# Patient Record
Sex: Male | Born: 1940 | Race: White | Hispanic: No | Marital: Married | Smoking: Former smoker
Health system: Southern US, Community
[De-identification: ages and names within clinical notes are randomized; demographics above are authoritative.]

## PROBLEM LIST (undated history)

## (undated) DIAGNOSIS — G4733 Obstructive sleep apnea (adult) (pediatric): Secondary | ICD-10-CM

## (undated) DIAGNOSIS — E559 Vitamin D deficiency, unspecified: Secondary | ICD-10-CM

## (undated) DIAGNOSIS — H409 Unspecified glaucoma: Secondary | ICD-10-CM

## (undated) DIAGNOSIS — G2581 Restless legs syndrome: Secondary | ICD-10-CM

## (undated) HISTORY — DX: Unspecified glaucoma: H40.9

## (undated) HISTORY — DX: Vitamin D deficiency, unspecified: E55.9

## (undated) HISTORY — DX: Obstructive sleep apnea (adult) (pediatric): G47.33

## (undated) HISTORY — DX: Restless legs syndrome: G25.81

---
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
## 1984-02-05 HISTORY — PX: OTHER SURGICAL HISTORY: SHX169

## 1997-03-24 ENCOUNTER — Ambulatory Visit (HOSPITAL_COMMUNITY): Admission: RE | Admit: 1997-03-24 | Discharge: 1997-03-24 | Payer: Self-pay | Admitting: Internal Medicine

## 1998-09-19 ENCOUNTER — Ambulatory Visit (HOSPITAL_BASED_OUTPATIENT_CLINIC_OR_DEPARTMENT_OTHER): Admission: RE | Admit: 1998-09-19 | Discharge: 1998-09-19 | Payer: Self-pay | Admitting: Orthopedic Surgery

## 1998-10-17 ENCOUNTER — Ambulatory Visit (HOSPITAL_BASED_OUTPATIENT_CLINIC_OR_DEPARTMENT_OTHER): Admission: RE | Admit: 1998-10-17 | Discharge: 1998-10-17 | Payer: Self-pay | Admitting: Orthopedic Surgery

## 1999-02-12 ENCOUNTER — Ambulatory Visit (HOSPITAL_COMMUNITY): Admission: RE | Admit: 1999-02-12 | Discharge: 1999-02-12 | Payer: Self-pay | Admitting: Internal Medicine

## 1999-02-12 ENCOUNTER — Encounter: Payer: Self-pay | Admitting: Internal Medicine

## 1999-02-15 ENCOUNTER — Encounter: Payer: Self-pay | Admitting: Cardiology

## 1999-02-15 ENCOUNTER — Inpatient Hospital Stay (HOSPITAL_COMMUNITY): Admission: AD | Admit: 1999-02-15 | Discharge: 1999-02-17 | Payer: Self-pay | Admitting: Cardiology

## 1999-02-21 ENCOUNTER — Ambulatory Visit (HOSPITAL_COMMUNITY): Admission: RE | Admit: 1999-02-21 | Discharge: 1999-02-21 | Payer: Self-pay | Admitting: Internal Medicine

## 1999-02-21 ENCOUNTER — Encounter: Payer: Self-pay | Admitting: Internal Medicine

## 1999-06-14 ENCOUNTER — Ambulatory Visit: Admission: RE | Admit: 1999-06-14 | Discharge: 1999-06-14 | Payer: Self-pay | Admitting: Internal Medicine

## 2000-05-26 ENCOUNTER — Encounter: Payer: Self-pay | Admitting: Internal Medicine

## 2000-05-26 ENCOUNTER — Ambulatory Visit (HOSPITAL_COMMUNITY): Admission: RE | Admit: 2000-05-26 | Discharge: 2000-05-26 | Payer: Self-pay | Admitting: Internal Medicine

## 2001-06-18 ENCOUNTER — Encounter: Payer: Self-pay | Admitting: Internal Medicine

## 2001-06-18 ENCOUNTER — Ambulatory Visit (HOSPITAL_COMMUNITY): Admission: RE | Admit: 2001-06-18 | Discharge: 2001-06-18 | Payer: Self-pay | Admitting: Internal Medicine

## 2001-06-24 ENCOUNTER — Encounter: Payer: Self-pay | Admitting: Internal Medicine

## 2001-06-24 ENCOUNTER — Ambulatory Visit (HOSPITAL_COMMUNITY): Admission: RE | Admit: 2001-06-24 | Discharge: 2001-06-24 | Payer: Self-pay | Admitting: Internal Medicine

## 2002-08-16 ENCOUNTER — Ambulatory Visit (HOSPITAL_COMMUNITY): Admission: RE | Admit: 2002-08-16 | Discharge: 2002-08-16 | Payer: Self-pay | Admitting: Internal Medicine

## 2002-08-16 ENCOUNTER — Encounter: Payer: Self-pay | Admitting: Internal Medicine

## 2003-10-04 ENCOUNTER — Ambulatory Visit (HOSPITAL_COMMUNITY): Admission: RE | Admit: 2003-10-04 | Discharge: 2003-10-04 | Payer: Self-pay | Admitting: Internal Medicine

## 2004-04-17 ENCOUNTER — Ambulatory Visit (HOSPITAL_COMMUNITY): Admission: RE | Admit: 2004-04-17 | Discharge: 2004-04-17 | Payer: Self-pay | Admitting: Internal Medicine

## 2005-08-23 ENCOUNTER — Ambulatory Visit (HOSPITAL_COMMUNITY): Admission: RE | Admit: 2005-08-23 | Discharge: 2005-08-23 | Payer: Self-pay | Admitting: Internal Medicine

## 2006-10-06 LAB — HM COLONOSCOPY

## 2006-10-09 ENCOUNTER — Ambulatory Visit: Payer: Self-pay | Admitting: Gastroenterology

## 2006-10-23 ENCOUNTER — Ambulatory Visit: Payer: Self-pay | Admitting: Gastroenterology

## 2006-10-23 ENCOUNTER — Encounter: Payer: Self-pay | Admitting: Gastroenterology

## 2010-06-22 NOTE — Discharge Summary (Signed)
[place]. [facility]  Patient:    Cameron Mcclain                    MRN: [id number] Adm. Date:  [id number] Disc. Date: [id number] Attending:  Loreli Dollar Dictator:   Durwin Glaze, P.A. CC:         Marinus Maw, M.D.             Clinton D. Maple Hudson, M.D.             Thereasa Solo. Little, M.D.                           Discharge Summary  DATE OF BIRTH:  08/04/1940  DISCHARGE DIAGNOSES: 1. Syncope of unclear etiology. 2. Sleep apnea. 3. Hyperlipidemia. 4. Gastroesophageal reflux disease. 5. History of prostatitis.  CONSULTATIONS:  Dr. Fannie Knee, pulmonology, regarding sleep apnea evaluation on February 16, 1999.  PROCEDURES:  None.  COMPLICATIONS:  None.  DISPOSITION:  Home.  DISCHARGE CONDITION:  Much improved.  No syncope, arrhythmias, or orthostatic changes.  DISCHARGE MEDICATIONS: 1. Paxil 10 mg 1 p.o. q.d. 2. Ziac 5 mg 1 p.o. q.d. 3. Zocor 80 mg 1 p.o. q.d. 4. Enteric-coated aspirin 325 mg 1 p.o. q.d. 5. Claritin 10 mg 1 p.o. q.d. 6. Nasacort spray, 2 sprays each naris twice per day x 3 days.  DISCHARGE INSTRUCTIONS: 1. No driving or strenuous activity until seen by Dr. Caprice Kluver. 2. Maintain low-fat, low-concentrated-sweet diet. 3. Follow up with Dr. Caprice Kluver in one to two weeks.  Call office for an    appointment. 4. Follow up with Dr. Maple Hudson regarding sleep apnea.  Call office for    appointment.  HISTORY OF PRESENT ILLNESS:  The patient is a 70 year old male patient with recent URI-type symptoms and had been placed on Zithromax and prednisone.  On Monday, the patient was started on Serevent, Afrin spray, and Sudafed for URI symptoms which were predominantly in head, neck, and chest.  That evening, about 5 hours after using Serevent inhaler, the patient was sitting at the table with his wife drinking wine and had an episode of coughing lasting about a minute followed by a two to three-minute episode of blank  stare on his face with facial flushing and completely unresponsive.   He had no seizure activity or incontinence but was sitting rigid in his chair.  He had no diaphoresis at this time.  His wife shook him, and the patient finally "came to."  The patient was completely alert and oriented and felt fine after that.  He had no recurrent problems for the remainder of that day or the following day, but on Wednesday, the day prior to admission, he went to bed without problems and was able to sleep.  The patients wife noticed abnormal breathing, gasping, sensation for air coming from bedroom and rushed to find her husband lying in bed very rigid, unresponsive, diaphoretic.  The episode lasted about five minutes.  The wife pounded on the patients chest, and the patient finally became completely oriented with no slurred speech or unilateral weakness.  He has had no prior history of seizure activities and is unaware of any arrhythmias.  The patient has had no problems or complaints similar to these prior to this admission.  Of noted, he did use inhaler before he went to bed on Wednesday night.  Over the last several months, the  wife has noted increasing problems with snoring and more episodes of what she describes as apneic episodes.  The patient was never diagnosed with sleep apnea but suspect this may be a problem for him.  The patient denies any chest pain, dyspnea on exertion, change in exercise tolerance, etc.  The patient does smoke and drink alcohol and has noted a slight increase in both of these recently.  The patient was admitted to a telemetry bed for monitoring for arrhythmias. Serevent inhaler was stopped.  A CT scan and EEG have been ordered to rule out seizure disorder.  Will ask Dr. Fannie Knee to evaluate for questionable sleep apnea.  Paxil was also reduced from 20 to 10 mg.  The remainder of medications are the same as outpatient.  ALLERGIES:  ANTIHISTAMINES.  LABORATORY  DATA:  CT of the head with and without contrast on February 15, 1999, negative.  TSH on February 15, 1999, was 0.401.  Hematology on admission showed an elevated white blood count of 17.6, hemoglobin 16.6, hematocrit 47.5, and platelets 277.  Routine chemistry on admission showed sodium 133, potassium 5.7, chloride 93, CO2 32, glucose 125, BUN 15, creatinine 0.8, calcium 9.4, total protein 6.8, albumin 3.7, AST 45, ALT 32, ALP 59, total bilirubin 0.5.  No admission EKG on chart.  Rhythm strips during admission show sinus rhythm/sinus tachycardia with no acute ST changes.  HOSPITAL COURSE:  The patient is a 70 year old male admitted on February 15, 1999, with syncopal episode x 2 of unclear etiology.  The patient was admitted to telemetry bed to rule out arrhythmias with CT of the head and EEG ordered to rule out seizure disorder.  The patients labs were all within normal limits except for elevated potassium and white blood count of unclear etiology.  The patient is on no medications that could be responsible for this except recent URI treated with Z-Pak, last dose February 15, 1999.  The patient has also been on a Serevent inhaler which was stopped on admission.  Initial chest x-ray done on admission showed no significant change since prior chest x-ray performed on March 24, 1997.  The patient had no complaint other than increased tiredness and fatigue over the last several months.  The patient is unaware of any snoring or any arrhythmias.  Pulmonary consult was called in.  They suggested probable obstructed sleep apnea but doubted that this was the direct cause of syncopal episodes as described on admission. Also noted the patient had probable bronchitis and periannular rhinitis.  He has scheduled the patient for sleep study, but it will be awhile secondary to backlog of patients.  Dr. Maple Hudson ordered overnight pulse oximetry and asked nurses to watch for snoring episodes.   Per Dr.  Maple Hudson, the patient was placed on continuous pulse oximetry on February 17, 1999, with patient desaturating on room air to 86 to 89% while asleep.  No acute respiratory distress was noted, and the patient was not snoring at that time.  The patient was then placed on 2 liters via nasal cannula, and O2 saturation were up to 92%.  It was noted that from 2 a.m. to approximately 2:30 a.m., the patient was snoring loudly with 1 to 3-second pauses, diaphoretic, but no acute respiratory distress, and the patient remained asleep.  O2 saturation 92 and 94% on 2 liters via nasal cannula.  At 6:45, the nurse notes state continuous pulse oximetry discontinued.  The patients O2 saturation was 95% on room air, and patient did not voice  any complaints.  The patient had one more episode of snoring with a 1 to 3-second pause, but patient unaware and asleep.  Overall snoring episodes between 4 and 5 a.m.  The patient had no complaints and felt like he slept a little better than the night of admission.  CT of head showed no active disease or processes at this time.  The patient was discharged with orders to follow up with Dr. Clarene Duke in one to two weeks and with Dr. Maple Hudson for sleep apnea workup.  The patient was put on Claritin and nasal steroid for upper respiratory symptoms.  The patients labs were repeated and found to be within normal limits before discharge. DD:  04/20/99 TD:  04/20/99 Job: [id number]

## 2011-06-04 ENCOUNTER — Other Ambulatory Visit: Payer: Self-pay | Admitting: Internal Medicine

## 2011-06-04 DIAGNOSIS — R413 Other amnesia: Secondary | ICD-10-CM

## 2011-06-06 ENCOUNTER — Ambulatory Visit
Admission: RE | Admit: 2011-06-06 | Discharge: 2011-06-06 | Disposition: A | Discharge status: Home / Self Care | Payer: Medicare HMO | Source: Ambulatory Visit | Attending: Internal Medicine | Admitting: Internal Medicine

## 2011-06-06 DIAGNOSIS — R413 Other amnesia: Secondary | ICD-10-CM

## 2011-06-06 MED ORDER — IOHEXOL 300 MG/ML  SOLN
75.0000 mL | Freq: Once | INTRAMUSCULAR | Status: AC | PRN
  Administered 2011-06-06: 75 mL via INTRAVENOUS

## 2011-06-18 ENCOUNTER — Ambulatory Visit (HOSPITAL_COMMUNITY)
Admission: RE | Admit: 2011-06-18 | Discharge: 2011-06-18 | Disposition: A | Discharge status: Home / Self Care | Payer: Medicare HMO | Source: Ambulatory Visit | Attending: Physician Assistant | Admitting: Physician Assistant

## 2011-06-18 ENCOUNTER — Other Ambulatory Visit (HOSPITAL_COMMUNITY): Payer: Self-pay | Admitting: Physician Assistant

## 2011-06-18 DIAGNOSIS — R059 Cough, unspecified: Secondary | ICD-10-CM | POA: Insufficient documentation

## 2011-06-18 DIAGNOSIS — R062 Wheezing: Secondary | ICD-10-CM

## 2011-06-18 DIAGNOSIS — J40 Bronchitis, not specified as acute or chronic: Secondary | ICD-10-CM

## 2011-06-18 DIAGNOSIS — R509 Fever, unspecified: Secondary | ICD-10-CM | POA: Insufficient documentation

## 2011-06-18 DIAGNOSIS — F17200 Nicotine dependence, unspecified, uncomplicated: Secondary | ICD-10-CM

## 2011-06-18 DIAGNOSIS — R05 Cough: Secondary | ICD-10-CM | POA: Insufficient documentation

## 2011-08-09 ENCOUNTER — Other Ambulatory Visit: Payer: Self-pay | Admitting: Diagnostic Neuroimaging

## 2011-08-09 DIAGNOSIS — R413 Other amnesia: Secondary | ICD-10-CM

## 2011-11-04 ENCOUNTER — Encounter: Payer: Self-pay | Admitting: Gastroenterology

## 2011-12-24 ENCOUNTER — Other Ambulatory Visit: Payer: Self-pay | Admitting: Internal Medicine

## 2011-12-24 DIAGNOSIS — R9389 Abnormal findings on diagnostic imaging of other specified body structures: Secondary | ICD-10-CM

## 2011-12-27 ENCOUNTER — Ambulatory Visit
Admission: RE | Admit: 2011-12-27 | Discharge: 2011-12-27 | Disposition: A | Discharge status: Home / Self Care | Payer: Medicare HMO | Source: Ambulatory Visit | Attending: Internal Medicine | Admitting: Internal Medicine

## 2011-12-27 DIAGNOSIS — R9389 Abnormal findings on diagnostic imaging of other specified body structures: Secondary | ICD-10-CM

## 2012-05-07 ENCOUNTER — Other Ambulatory Visit: Payer: Self-pay | Admitting: Diagnostic Neuroimaging

## 2012-07-30 ENCOUNTER — Encounter: Payer: Self-pay | Admitting: Gastroenterology

## 2012-11-08 ENCOUNTER — Other Ambulatory Visit: Payer: Self-pay

## 2012-11-08 MED ORDER — DONEPEZIL HCL 10 MG PO TABS: 10.0000 mg | ORAL_TABLET | Freq: Every day | ORAL | Status: DC

## 2012-12-07 ENCOUNTER — Other Ambulatory Visit: Payer: Self-pay | Admitting: Diagnostic Neuroimaging

## 2012-12-11 NOTE — Telephone Encounter (Signed)
Per phone note of 09.26.2013. Patient also to schedule appt.

## 2012-12-28 ENCOUNTER — Encounter: Payer: Self-pay | Admitting: Internal Medicine

## 2012-12-29 ENCOUNTER — Ambulatory Visit: Payer: Commercial Managed Care - HMO | Admitting: Internal Medicine

## 2012-12-29 ENCOUNTER — Encounter: Payer: Self-pay | Admitting: Internal Medicine

## 2012-12-29 VITALS — BP 100/58 | HR 68 | Temp 98.1°F | Resp 16 | Ht 68.75 in | Wt 161.6 lb

## 2012-12-29 DIAGNOSIS — R1900 Intra-abdominal and pelvic swelling, mass and lump, unspecified site: Secondary | ICD-10-CM

## 2012-12-29 DIAGNOSIS — Z125 Encounter for screening for malignant neoplasm of prostate: Secondary | ICD-10-CM

## 2012-12-29 DIAGNOSIS — I10 Essential (primary) hypertension: Secondary | ICD-10-CM

## 2012-12-29 DIAGNOSIS — E782 Mixed hyperlipidemia: Secondary | ICD-10-CM

## 2012-12-29 DIAGNOSIS — E559 Vitamin D deficiency, unspecified: Secondary | ICD-10-CM

## 2012-12-29 DIAGNOSIS — Z1212 Encounter for screening for malignant neoplasm of rectum: Secondary | ICD-10-CM

## 2012-12-29 DIAGNOSIS — Z0000 Encounter for general adult medical examination without abnormal findings: Secondary | ICD-10-CM

## 2012-12-29 DIAGNOSIS — R7309 Other abnormal glucose: Secondary | ICD-10-CM

## 2012-12-29 DIAGNOSIS — M790 Rheumatism, unspecified: Secondary | ICD-10-CM

## 2012-12-29 DIAGNOSIS — Z79899 Other long term (current) drug therapy: Secondary | ICD-10-CM

## 2012-12-29 DIAGNOSIS — F0280 Dementia in other diseases classified elsewhere without behavioral disturbance: Secondary | ICD-10-CM

## 2012-12-29 LAB — LIPID PANEL
Cholesterol: 230 mg/dL — ABNORMAL HIGH (ref 0–200)
Total CHOL/HDL Ratio: 4.1 Ratio
Triglycerides: 119 mg/dL (ref <150)
VLDL: 24 mg/dL (ref 0–40)

## 2012-12-29 LAB — CBC WITH DIFFERENTIAL/PLATELET
Basophils Absolute: 0.1 10*3/uL (ref 0.0–0.1)
Eosinophils Absolute: 0.2 10*3/uL (ref 0.0–0.7)
HCT: 43.7 % (ref 39.0–52.0)
Lymphocytes Relative: 49 % — ABNORMAL HIGH (ref 12–46)
Lymphs Abs: 4.1 10*3/uL — ABNORMAL HIGH (ref 0.7–4.0)
MCH: 32.9 pg (ref 26.0–34.0)
MCHC: 34.3 g/dL (ref 30.0–36.0)
MCV: 95.8 fL (ref 78.0–100.0)
Neutrophils Relative %: 39 % — ABNORMAL LOW (ref 43–77)
Platelets: 268 10*3/uL (ref 150–400)

## 2012-12-29 LAB — BASIC METABOLIC PANEL WITH GFR
Chloride: 100 mEq/L (ref 96–112)
GFR, Est Non African American: 88 mL/min
Glucose, Bld: 88 mg/dL (ref 70–99)

## 2012-12-29 LAB — HEMOGLOBIN A1C
Hgb A1c MFr Bld: 5.6 % (ref <5.7)
Mean Plasma Glucose: 114 mg/dL (ref <117)

## 2012-12-29 LAB — HEPATIC FUNCTION PANEL
AST: 14 U/L (ref 0–37)
Albumin: 4.5 g/dL (ref 3.5–5.2)
Alkaline Phosphatase: 51 U/L (ref 39–117)
Bilirubin, Direct: 0.1 mg/dL (ref 0.0–0.3)

## 2012-12-29 LAB — MAGNESIUM: Magnesium: 2.1 mg/dL (ref 1.5–2.5)

## 2012-12-29 MED ORDER — COENZYME Q10 30 MG PO CAPS: 30.0000 mg | ORAL_CAPSULE | Freq: Every day | ORAL | Status: DC

## 2012-12-29 MED ORDER — VITAMIN D 50 MCG (2000 UT) PO TABS: 2000.0000 [IU] | ORAL_TABLET | Freq: Every day | ORAL | Status: DC

## 2012-12-29 MED ORDER — CALCIUM CARBONATE-VITAMIN D 500-200 MG-UNIT PO TABS: 1.0000 | ORAL_TABLET | Freq: Every day | ORAL | Status: DC

## 2012-12-29 MED ORDER — FISH OIL 1000 MG PO CAPS: 1000.0000 mg | ORAL_CAPSULE | Freq: Two times a day (BID) | ORAL | Status: DC

## 2012-12-29 NOTE — Patient Instructions (Signed)

## 2012-12-29 NOTE — Progress Notes (Signed)
Patient ID: Cameron Mcclain, male   DOB: [birth year], 72 y.o.   MRN: [id number]  Annual Screening Comprehensive Examination  This very nice 72 yo MWM presents for complete physical.  Patient has been followed for HTN, SDAT, Prediabetes, Hyperlipidemia, and vitamin D Deficiency.   Patient's BP has been controlled at home. He ha lost 13 # over the last year and 20 # over the last 2 years attributed to better eating habits. Patient denies any cardiac symptoms as chest pain, palpitations, shortness of breath, dizziness or ankle swelling.   Patient's hyperlipidemia is controlled with diet and medications. Patient denies myalgias or other medication SE's. Last cholesterol last visit was 180 , triglycerides 164 , HDL 43 and LDL  104 at goal and off Pravastatin which his wife discontinued with his weight loss.     Patient has hx/o prediabetes with A1c 5.8% in November 2011 and last A1c 5.1% in June. Patient denies reactive hypoglycemic symptoms, visual blurring, diabetic polys, or paresthesias.    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 She has stopped his Aricept and Namenda and feels he has improved mentally off of the medication.   Finally, patient has history of Vitamin D Deficiency with last vitamin D 82 in June (was 24 in 2008)     Current Outpatient Prescriptions on File Prior to Visit  Medication Sig Dispense Refill  . B Complex-C (SUPER B COMPLEX PO) Take by mouth.      . benazepril (LOTENSIN) 20 MG tablet Take 20 mg by mouth daily. Take 1&1/2 tablets daily.      . bisoprolol (ZEBETA) 10 MG tablet Take 10 mg by mouth daily. Take 1/2 tablet daily.      Marland Kitchen donepezil (ARICEPT) 10 MG tablet take 1 tablet by mouth once daily  30 tablet  2  . pravastatin (PRAVACHOL) 40 MG tablet Take 40 mg by  mouth daily. Take 1/2 tablet daily.      . sulindac (CLINORIL) 200 MG tablet Take 200 mg by mouth daily.        No Active Allergies  Past Medical History  Diagnosis Date  . Hyperlipidemia   . Hypertension   . Pre-diabetes   . Vitamin D deficiency   . OSA (obstructive sleep apnea)   . RLS (restless legs syndrome)   . Glaucoma     Past Surgical History  Procedure Laterality Date  . Pilonidal cyst / sinus excision  1959  . Axillary Right 1986    biopsy of axillary nodes    Family History  Problem Relation Age of Onset  . Hypertension Mother   . Diabetes Mother   . Hypertension Father   . Cancer Father     History   Social History  . Marital Status: Married    Spouse Name: N/A    Number of Children: N/A  . Years of Education: N/A   Occupational History  . Not on file.   Social History Main Topics  . Smoking status: Former Games developer  . Smokeless tobacco: Not on file  . Alcohol Use: Yes  . Drug Use: No  . Sexual Activity: Not on file   Other Topics Concern  . Not on file   Social History Narrative  . No narrative on file    ROS Constitutional: Denies fever, chills, weight loss/gain, headaches, insomnia,  fatigue, night sweats, and change in appetite. Eyes: Denies redness, blurred vision, diplopia, discharge, itchy, watery eyes.  ENT: Denies discharge, congestion, post nasal drip, epistaxis, sore throat, earache, hearing loss, dental pain, Tinnitus, Vertigo, Sinus pain, snoring.  Cardio: Denies chest pain, palpitations, irregular heartbeat, syncope, dyspnea, diaphoresis, orthopnea, PND, claudication, edema Respiratory: denies cough, dyspnea, DOE, pleurisy, hoarseness, laryngitis, wheezing.  Gastrointestinal: Denies dysphagia, heartburn, reflux, water brash, pain, cramps, nausea, vomiting, bloating, diarrhea, constipation, hematemesis, melena, hematochezia, jaundice, hemorrhoids Genitourinary: Denies dysuria, frequency, urgency, nocturia, hesitancy, discharge,  hematuria, flank pain Musculoskeletal: Denies arthralgia, myalgia, stiffness, Jt. Swelling, pain, limp, and strain/sprain. Skin: Denies puritis, rash, hives, warts, acne, eczema, changing in skin lesion Neuro: Weakness, tremor, incoordination, spasms, paresthesia, pain Psychiatric: Denies confusion, memory loss, sensory loss Endocrine: Denies change in weight, skin, hair change, nocturia, and paresthesia, diabetic polys, visual blurring, hyper /hypo glycemic episodes.  Heme/Lymph: No excessive bleeding, bruising, enlarged lymph nodes.  Filed Vitals:   12/29/12 1425  BP: 100/58  Pulse: 68  Temp: 98.1 F (36.7 C)  Resp: 16    Estimated body mass index is 24.04 kg/(m^2) as calculated from the following:   Height as of this encounter: 5' 8.75" (1.746 m).   Weight as of this encounter: 161 lb 9.6 oz (73.301 kg).  Physical Exam General Appearance: Well nourished, in no apparent distress. Eyes: PERRLA, EOMs, conjunctiva no swelling or erythema, normal fundi and vessels. Sinuses: No frontal/maxillary tenderness ENT/Mouth: EACs patent / TMs  nl. Nares clear without erythema, swelling, mucoid exudates. Oral hygiene is good. No erythema, swelling, or exudate. Tongue normal, non-obstructing. Tonsils not swollen or erythematous. Hearing normal.  Neck: Supple, thyroid normal. No bruits, nodes or JVD. Respiratory: Respiratory effort normal.  BS equal and clear bilateral without rales, rhonci, wheezing or stridor. Cardio: Heart sounds are normal with regular rate and rhythm and no murmurs, rubs or gallops. Peripheral pulses are normal and equal bilaterally without edema. No aortic or femoral bruits. Chest: symmetric with normal excursions and percussion.  Abdomen: Flat, soft, with bowl sounds. Nontender, no guarding, rebound, hernias, masses, or organomegaly.  Lymphatics: Non tender without lymphadenopathy.  Genitourinary: No hernias.Testes nl. DRE - prostate nl for age - smooth & firm w/o  nodules. Musculoskeletal: Full ROM all peripheral extremities, joint stability, 5/5 strength, and normal gait. Skin: Warm and dry without rashes, lesions, cyanosis, clubbing or  ecchymosis.  Neuro: Cranial nerves intact, reflexes equal bilaterally. Normal muscle tone, no cerebellar symptoms. Sensation intact. No focal neuro signs as snout or palmomental response. Mild decrease in ST recall. Pysch: Awake and oriented X 3, normal affect, Insight and Judgment appropriate.   Assessment and Plan  1. Annual Screening Examination 2. Hypertension - agree with wife to stop his benazepril & monitor his  BPs 3. Hyperlipidemia 4. Pre Diabetes 5. Vitamin D Deficiency 6. SDAT  Continue prudent diet as discussed, weight control, regular exercise, and medications. Routine screening labs and tests as requested with regular follow-up as recommended.

## 2012-12-30 LAB — URINALYSIS, MICROSCOPIC ONLY: Squamous Epithelial / LPF: NONE SEEN

## 2012-12-30 LAB — MICROALBUMIN / CREATININE URINE RATIO
Creatinine, Urine: 103.2 mg/dL
Microalb Creat Ratio: 4.8 mg/g (ref 0.0–30.0)

## 2013-01-01 ENCOUNTER — Other Ambulatory Visit: Payer: Self-pay | Admitting: Internal Medicine

## 2013-01-01 DIAGNOSIS — E782 Mixed hyperlipidemia: Secondary | ICD-10-CM

## 2013-01-01 MED ORDER — ATORVASTATIN CALCIUM 80 MG PO TABS: 80.0000 mg | ORAL_TABLET | Freq: Every day | ORAL | Status: DC

## 2013-01-06 ENCOUNTER — Other Ambulatory Visit (INDEPENDENT_AMBULATORY_CARE_PROVIDER_SITE_OTHER): Payer: Medicare HMO | Admitting: Physician Assistant

## 2013-01-06 DIAGNOSIS — Z1212 Encounter for screening for malignant neoplasm of rectum: Secondary | ICD-10-CM

## 2013-01-06 LAB — POC HEMOCCULT BLD/STL (HOME/3-CARD/SCREEN)
Card #2 Fecal Occult Blod, POC: NEGATIVE
Card #3 Fecal Occult Blood, POC: NEGATIVE
Fecal Occult Blood, POC: NEGATIVE

## 2013-01-07 ENCOUNTER — Ambulatory Visit (HOSPITAL_COMMUNITY)
Admission: RE | Admit: 2013-01-07 | Discharge: 2013-01-07 | Disposition: A | Discharge status: Home / Self Care | Payer: Medicare HMO | Source: Ambulatory Visit | Attending: Internal Medicine | Admitting: Internal Medicine

## 2013-01-07 DIAGNOSIS — R634 Abnormal weight loss: Secondary | ICD-10-CM | POA: Insufficient documentation

## 2013-01-07 DIAGNOSIS — I700 Atherosclerosis of aorta: Secondary | ICD-10-CM | POA: Insufficient documentation

## 2013-01-07 DIAGNOSIS — R1900 Intra-abdominal and pelvic swelling, mass and lump, unspecified site: Secondary | ICD-10-CM | POA: Insufficient documentation

## 2013-01-07 DIAGNOSIS — Q619 Cystic kidney disease, unspecified: Secondary | ICD-10-CM | POA: Insufficient documentation

## 2013-05-03 ENCOUNTER — Emergency Department (HOSPITAL_COMMUNITY): Payer: Medicare HMO

## 2013-05-03 ENCOUNTER — Inpatient Hospital Stay (HOSPITAL_COMMUNITY)
Admission: EM | Admit: 2013-05-03 | Discharge: 2013-05-07 | DRG: 193 | Disposition: A | Discharge status: Home / Self Care | Payer: Medicare HMO | Attending: Internal Medicine | Admitting: Internal Medicine

## 2013-05-03 ENCOUNTER — Ambulatory Visit (INDEPENDENT_AMBULATORY_CARE_PROVIDER_SITE_OTHER): Payer: Commercial Managed Care - HMO | Admitting: Physician Assistant

## 2013-05-03 ENCOUNTER — Encounter (HOSPITAL_COMMUNITY): Payer: Self-pay | Admitting: Emergency Medicine

## 2013-05-03 ENCOUNTER — Encounter: Payer: Self-pay | Admitting: Physician Assistant

## 2013-05-03 VITALS — BP 128/78 | HR 100 | Temp 98.6°F | Resp 16 | Wt 159.0 lb

## 2013-05-03 DIAGNOSIS — J441 Chronic obstructive pulmonary disease with (acute) exacerbation: Secondary | ICD-10-CM | POA: Diagnosis present

## 2013-05-03 DIAGNOSIS — Z79899 Other long term (current) drug therapy: Secondary | ICD-10-CM

## 2013-05-03 DIAGNOSIS — E559 Vitamin D deficiency, unspecified: Secondary | ICD-10-CM | POA: Diagnosis present

## 2013-05-03 DIAGNOSIS — Z888 Allergy status to other drugs, medicaments and biological substances status: Secondary | ICD-10-CM

## 2013-05-03 DIAGNOSIS — G4733 Obstructive sleep apnea (adult) (pediatric): Secondary | ICD-10-CM | POA: Diagnosis present

## 2013-05-03 DIAGNOSIS — M790 Rheumatism, unspecified: Secondary | ICD-10-CM

## 2013-05-03 DIAGNOSIS — M797 Fibromyalgia: Secondary | ICD-10-CM

## 2013-05-03 DIAGNOSIS — J189 Pneumonia, unspecified organism: Principal | ICD-10-CM | POA: Diagnosis present

## 2013-05-03 DIAGNOSIS — F0280 Dementia in other diseases classified elsewhere without behavioral disturbance: Secondary | ICD-10-CM | POA: Diagnosis present

## 2013-05-03 DIAGNOSIS — Z833 Family history of diabetes mellitus: Secondary | ICD-10-CM

## 2013-05-03 DIAGNOSIS — Z8249 Family history of ischemic heart disease and other diseases of the circulatory system: Secondary | ICD-10-CM

## 2013-05-03 DIAGNOSIS — E782 Mixed hyperlipidemia: Secondary | ICD-10-CM

## 2013-05-03 DIAGNOSIS — I10 Essential (primary) hypertension: Secondary | ICD-10-CM | POA: Diagnosis present

## 2013-05-03 DIAGNOSIS — G301 Alzheimer's disease with late onset: Secondary | ICD-10-CM

## 2013-05-03 DIAGNOSIS — G2581 Restless legs syndrome: Secondary | ICD-10-CM | POA: Diagnosis present

## 2013-05-03 DIAGNOSIS — J209 Acute bronchitis, unspecified: Secondary | ICD-10-CM

## 2013-05-03 DIAGNOSIS — R7309 Other abnormal glucose: Secondary | ICD-10-CM | POA: Diagnosis present

## 2013-05-03 DIAGNOSIS — J9600 Acute respiratory failure, unspecified whether with hypoxia or hypercapnia: Secondary | ICD-10-CM | POA: Diagnosis present

## 2013-05-03 DIAGNOSIS — H409 Unspecified glaucoma: Secondary | ICD-10-CM | POA: Diagnosis present

## 2013-05-03 DIAGNOSIS — G309 Alzheimer's disease, unspecified: Secondary | ICD-10-CM | POA: Diagnosis present

## 2013-05-03 DIAGNOSIS — R000 Tachycardia, unspecified: Secondary | ICD-10-CM | POA: Diagnosis present

## 2013-05-03 DIAGNOSIS — Z87891 Personal history of nicotine dependence: Secondary | ICD-10-CM

## 2013-05-03 DIAGNOSIS — E785 Hyperlipidemia, unspecified: Secondary | ICD-10-CM | POA: Diagnosis present

## 2013-05-03 LAB — URINALYSIS, ROUTINE W REFLEX MICROSCOPIC
BILIRUBIN URINE: NEGATIVE
GLUCOSE, UA: NEGATIVE mg/dL
HGB URINE DIPSTICK: NEGATIVE
Ketones, ur: 15 mg/dL — AB
Leukocytes, UA: NEGATIVE
Nitrite: NEGATIVE
PROTEIN: NEGATIVE mg/dL
Specific Gravity, Urine: 1.024 (ref 1.005–1.030)
UROBILINOGEN UA: 0.2 mg/dL (ref 0.0–1.0)
pH: 5 (ref 5.0–8.0)

## 2013-05-03 LAB — I-STAT CG4 LACTIC ACID, ED: Lactic Acid, Venous: 2.38 mmol/L — ABNORMAL HIGH (ref 0.5–2.2)

## 2013-05-03 LAB — CBC WITH DIFFERENTIAL/PLATELET
BASOS ABS: 0 10*3/uL (ref 0.0–0.1)
Basophils Relative: 0 % (ref 0–1)
Eosinophils Absolute: 0 10*3/uL (ref 0.0–0.7)
Eosinophils Relative: 0 % (ref 0–5)
HCT: 45.4 % (ref 39.0–52.0)
Hemoglobin: 16.3 g/dL (ref 13.0–17.0)
LYMPHS ABS: 1.1 10*3/uL (ref 0.7–4.0)
LYMPHS PCT: 9 % — AB (ref 12–46)
MCH: 33.4 pg (ref 26.0–34.0)
MCHC: 35.9 g/dL (ref 30.0–36.0)
MCV: 93 fL (ref 78.0–100.0)
Monocytes Absolute: 0.8 10*3/uL (ref 0.1–1.0)
Monocytes Relative: 7 % (ref 3–12)
NEUTROS ABS: 9.4 10*3/uL — AB (ref 1.7–7.7)
NEUTROS PCT: 84 % — AB (ref 43–77)
PLATELETS: 177 10*3/uL (ref 150–400)
RBC: 4.88 MIL/uL (ref 4.22–5.81)
RDW: 12.1 % (ref 11.5–15.5)
WBC: 11.3 10*3/uL — AB (ref 4.0–10.5)

## 2013-05-03 LAB — COMPREHENSIVE METABOLIC PANEL
ALK PHOS: 64 U/L (ref 39–117)
ALT: 19 U/L (ref 0–53)
AST: 29 U/L (ref 0–37)
Albumin: 4.3 g/dL (ref 3.5–5.2)
BILIRUBIN TOTAL: 0.3 mg/dL (ref 0.3–1.2)
BUN: 16 mg/dL (ref 6–23)
CALCIUM: 9 mg/dL (ref 8.4–10.5)
CHLORIDE: 95 meq/L — AB (ref 96–112)
CO2: 24 meq/L (ref 19–32)
Creatinine, Ser: 0.75 mg/dL (ref 0.50–1.35)
GFR, EST NON AFRICAN AMERICAN: 89 mL/min — AB (ref >90)
GLUCOSE: 116 mg/dL — AB (ref 70–99)
POTASSIUM: 3.9 meq/L (ref 3.7–5.3)
Sodium: 137 mEq/L (ref 137–147)
Total Protein: 7.9 g/dL (ref 6.0–8.3)

## 2013-05-03 LAB — TROPONIN I

## 2013-05-03 LAB — PRO B NATRIURETIC PEPTIDE: Pro B Natriuretic peptide (BNP): 44.7 pg/mL (ref 0–125)

## 2013-05-03 MED ORDER — IPRATROPIUM-ALBUTEROL 0.5-2.5 (3) MG/3ML IN SOLN
3.0000 mL | Freq: Once | RESPIRATORY_TRACT | Status: AC
  Administered 2013-05-03: 3 mL via RESPIRATORY_TRACT

## 2013-05-03 MED ORDER — SODIUM CHLORIDE 0.9 % IJ SOLN
3.0000 mL | Freq: Two times a day (BID) | INTRAMUSCULAR | Status: DC
  Administered 2013-05-04 – 2013-05-07 (×6): 3 mL via INTRAVENOUS

## 2013-05-03 MED ORDER — DOCUSATE SODIUM 100 MG PO CAPS
100.0000 mg | ORAL_CAPSULE | Freq: Two times a day (BID) | ORAL | Status: DC
  Administered 2013-05-03 – 2013-05-07 (×8): 100 mg via ORAL
  Filled 2013-05-03 (×8): qty 1

## 2013-05-03 MED ORDER — PROMETHAZINE-DM 6.25-15 MG/5ML PO SYRP: 5.0000 mL | ORAL_SOLUTION | Freq: Four times a day (QID) | ORAL | Status: DC | PRN

## 2013-05-03 MED ORDER — CEFTRIAXONE SODIUM 1 G IJ SOLR
1.0000 g | Freq: Once | INTRAMUSCULAR | Status: AC
  Administered 2013-05-03: 1 g via INTRAMUSCULAR

## 2013-05-03 MED ORDER — SODIUM CHLORIDE 0.9 % IV BOLUS (SEPSIS)
1000.0000 mL | Freq: Once | INTRAVENOUS | Status: AC
  Administered 2013-05-03: 1000 mL via INTRAVENOUS

## 2013-05-03 MED ORDER — ACETAMINOPHEN 650 MG RE SUPP
650.0000 mg | Freq: Once | RECTAL | Status: AC
  Administered 2013-05-03: 650 mg via RECTAL
  Filled 2013-05-03: qty 1

## 2013-05-03 MED ORDER — COENZYME Q10 30 MG PO CAPS: 30.0000 mg | ORAL_CAPSULE | Freq: Every day | ORAL | Status: DC

## 2013-05-03 MED ORDER — ACETAMINOPHEN 325 MG PO TABS: 650.0000 mg | ORAL_TABLET | Freq: Four times a day (QID) | ORAL | Status: DC | PRN

## 2013-05-03 MED ORDER — IPRATROPIUM BROMIDE 0.02 % IN SOLN
0.5000 mg | Freq: Four times a day (QID) | RESPIRATORY_TRACT | Status: DC
  Administered 2013-05-04 (×2): 0.5 mg via RESPIRATORY_TRACT
  Filled 2013-05-03 (×2): qty 2.5

## 2013-05-03 MED ORDER — ALBUTEROL SULFATE (2.5 MG/3ML) 0.083% IN NEBU
2.5000 mg | INHALATION_SOLUTION | RESPIRATORY_TRACT | Status: DC | PRN
  Administered 2013-05-04 – 2013-05-06 (×4): 2.5 mg via RESPIRATORY_TRACT
  Filled 2013-05-03 (×5): qty 3

## 2013-05-03 MED ORDER — METHYLPREDNISOLONE SODIUM SUCC 125 MG IJ SOLR
60.0000 mg | Freq: Four times a day (QID) | INTRAMUSCULAR | Status: DC
  Administered 2013-05-03 – 2013-05-04 (×3): 60 mg via INTRAVENOUS
  Filled 2013-05-03 (×6): qty 0.96

## 2013-05-03 MED ORDER — BISACODYL 10 MG RE SUPP: 10.0000 mg | Freq: Every day | RECTAL | Status: DC | PRN

## 2013-05-03 MED ORDER — PREDNISONE 20 MG PO TABS: ORAL_TABLET | ORAL | Status: DC

## 2013-05-03 MED ORDER — CEFTRIAXONE SODIUM 500 MG IJ SOLR: 500.0000 mg | Freq: Once | INTRAMUSCULAR | Status: DC

## 2013-05-03 MED ORDER — GUAIFENESIN ER 600 MG PO TB12
600.0000 mg | ORAL_TABLET | Freq: Two times a day (BID) | ORAL | Status: DC | PRN
  Administered 2013-05-03: 600 mg via ORAL
  Filled 2013-05-03: qty 1

## 2013-05-03 MED ORDER — OMEGA-3-ACID ETHYL ESTERS 1 G PO CAPS
1.0000 g | ORAL_CAPSULE | Freq: Every day | ORAL | Status: DC
  Administered 2013-05-04 – 2013-05-07 (×4): 1 g via ORAL
  Filled 2013-05-03 (×5): qty 1

## 2013-05-03 MED ORDER — DEXTROSE 5 % IV SOLN
500.0000 mg | Freq: Once | INTRAVENOUS | Status: AC
  Administered 2013-05-03: 500 mg via INTRAVENOUS

## 2013-05-03 MED ORDER — ALUM & MAG HYDROXIDE-SIMETH 200-200-20 MG/5ML PO SUSP: 30.0000 mL | Freq: Four times a day (QID) | ORAL | Status: DC | PRN

## 2013-05-03 MED ORDER — SODIUM CHLORIDE 0.45 % IV SOLN
INTRAVENOUS | Status: DC
  Administered 2013-05-03 – 2013-05-04 (×2): via INTRAVENOUS

## 2013-05-03 MED ORDER — ALBUTEROL SULFATE (2.5 MG/3ML) 0.083% IN NEBU
5.0000 mg | INHALATION_SOLUTION | Freq: Once | RESPIRATORY_TRACT | Status: AC
  Administered 2013-05-03: 5 mg via RESPIRATORY_TRACT
  Filled 2013-05-03: qty 6

## 2013-05-03 MED ORDER — ACETAMINOPHEN 650 MG RE SUPP: 650.0000 mg | Freq: Four times a day (QID) | RECTAL | Status: DC | PRN

## 2013-05-03 MED ORDER — IOHEXOL 350 MG/ML SOLN
100.0000 mL | Freq: Once | INTRAVENOUS | Status: AC | PRN
  Administered 2013-05-03: 100 mL via INTRAVENOUS

## 2013-05-03 MED ORDER — LEVOFLOXACIN 500 MG PO TABS: 500.0000 mg | ORAL_TABLET | Freq: Every day | ORAL | Status: DC

## 2013-05-03 MED ORDER — DEXTROSE 5 % IV SOLN
1.0000 g | INTRAVENOUS | Status: DC
  Administered 2013-05-03 – 2013-05-04 (×2): 1 g via INTRAVENOUS
  Filled 2013-05-03 (×3): qty 10

## 2013-05-03 MED ORDER — ASPIRIN EC 81 MG PO TBEC
81.0000 mg | DELAYED_RELEASE_TABLET | Freq: Every day | ORAL | Status: DC
  Administered 2013-05-03 – 2013-05-07 (×5): 81 mg via ORAL
  Filled 2013-05-03 (×5): qty 1

## 2013-05-03 MED ORDER — DEXTROSE 5 % IV SOLN
500.0000 mg | INTRAVENOUS | Status: DC
  Administered 2013-05-04 – 2013-05-05 (×2): 500 mg via INTRAVENOUS
  Filled 2013-05-03 (×2): qty 500

## 2013-05-03 MED ORDER — VITAMIN D 1000 UNITS PO TABS
2000.0000 [IU] | ORAL_TABLET | Freq: Every day | ORAL | Status: DC
  Administered 2013-05-04 – 2013-05-07 (×4): 2000 [IU] via ORAL
  Filled 2013-05-03 (×4): qty 2

## 2013-05-03 MED ORDER — LATANOPROST 0.005 % OP SOLN
1.0000 [drp] | Freq: Every day | OPHTHALMIC | Status: DC
  Administered 2013-05-03 – 2013-05-06 (×4): 1 [drp] via OPHTHALMIC
  Filled 2013-05-03: qty 2.5

## 2013-05-03 MED ORDER — HEPARIN SODIUM (PORCINE) 5000 UNIT/ML IJ SOLN
5000.0000 [IU] | Freq: Three times a day (TID) | INTRAMUSCULAR | Status: DC
  Administered 2013-05-03 – 2013-05-07 (×10): 5000 [IU] via SUBCUTANEOUS
  Filled 2013-05-03 (×15): qty 1

## 2013-05-03 MED ORDER — MAGNESIUM CITRATE PO SOLN
1.0000 | Freq: Once | ORAL | Status: AC | PRN
  Filled 2013-05-03: qty 296

## 2013-05-03 NOTE — ED Notes (Signed)
Pt placed on NRB 100% related to SPO2 84% on 5L. MD informed.

## 2013-05-03 NOTE — Patient Instructions (Signed)
Please take the prednisone to help decrease inflammation and therefore decrease symptoms. Take it it with food to avoid GI upset. It can cause increased energy but on the other hand it can make it hard to sleep at night so please take it in the morning.  It is not an antibiotic so you can stop it early if you are feeling better.  If you are diabetic it will increase your sugars.   Bronchitis Bronchitis is inflammation of the airways that extend from the windpipe into the lungs (bronchi). The inflammation often causes mucus to develop, which leads to a cough. If the inflammation becomes severe, it may cause shortness of breath. CAUSES  Bronchitis may be caused by:   Viral infections.   Bacteria.   Cigarette smoke.   Allergens, pollutants, and other irritants.  SIGNS AND SYMPTOMS  The most common symptom of bronchitis is a frequent cough that produces mucus. Other symptoms include:  Fever.   Body aches.   Chest congestion.   Chills.   Shortness of breath.   Sore throat.  DIAGNOSIS  Bronchitis is usually diagnosed through a medical history and physical exam. Tests, such as chest X-rays, are sometimes done to rule out other conditions.  TREATMENT  You may need to avoid contact with whatever caused the problem (smoking, for example). Medicines are sometimes needed. These may include:  Antibiotics. These may be prescribed if the condition is caused by bacteria.  Cough suppressants. These may be prescribed for relief of cough symptoms.   Inhaled medicines. These may be prescribed to help open your airways and make it easier for you to breathe.   Steroid medicines. These may be prescribed for those with recurrent (chronic) bronchitis. HOME CARE INSTRUCTIONS  Get plenty of rest.   Drink enough fluids to keep your urine clear or pale yellow (unless you have a medical condition that requires fluid restriction). Increasing fluids may help thin your secretions and will  prevent dehydration.   Only take over-the-counter or prescription medicines as directed by your health care provider.  Only take antibiotics as directed. Make sure you finish them even if you start to feel better.  Avoid secondhand smoke, irritating chemicals, and strong fumes. These will make bronchitis worse. If you are a smoker, quit smoking. Consider using nicotine gum or skin patches to help control withdrawal symptoms. Quitting smoking will help your lungs heal faster.   Put a cool-mist humidifier in your bedroom at night to moisten the air. This may help loosen mucus. Change the water in the humidifier daily. You can also run the hot water in your shower and sit in the bathroom with the door closed for 5 10 minutes.   Follow up with your health care provider as directed.   Wash your hands frequently to avoid catching bronchitis again or spreading an infection to others.  SEEK MEDICAL CARE IF: Your symptoms do not improve after 1 week of treatment.  SEEK IMMEDIATE MEDICAL CARE IF:  Your fever increases.  You have chills.   You have chest pain.   You have worsening shortness of breath.   You have bloody sputum.  You faint.  You have lightheadedness.  You have a severe headache.   You vomit repeatedly. MAKE SURE YOU:   Understand these instructions.  Will watch your condition.  Will get help right away if you are not doing well or get worse. Document Released: 01/21/2005 Document Revised: 11/11/2012 Document Reviewed: 09/15/2012 [employer] Patient Information 2014 [employer].  

## 2013-05-03 NOTE — ED Provider Notes (Signed)
CSN: [id number]     Arrival date & time 05/03/13  1549 History   First MD Initiated Contact with Patient 05/03/13 1558     Chief Complaint  Patient presents with  . Shortness of Breath  . Fever     (Consider location/radiation/quality/duration/timing/severity/associated sxs/prior Treatment) Patient is a 73 y.o. male presenting with cough.  Cough Cough characteristics:  Productive Severity:  Moderate Onset quality:  Gradual Duration:  4 days Timing:  Constant Progression:  Worsening Chronicity:  New Smoker: no   Relieved by:  Nothing Worsened by:  Nothing tried Associated symptoms: chills and shortness of breath   Associated symptoms: no chest pain     Past Medical History  Diagnosis Date  . Hyperlipidemia   . Hypertension   . Pre-diabetes   . Vitamin D deficiency   . OSA (obstructive sleep apnea)   . RLS (restless legs syndrome)   . Glaucoma    Past Surgical History  Procedure Laterality Date  . Pilonidal cyst / sinus excision  1959  . Axillary Right 1986    biopsy of axillary nodes   Family History  Problem Relation Age of Onset  . Hypertension Mother   . Diabetes Mother   . Hypertension Father   . Cancer Father    History  Substance Use Topics  . Smoking status: Former Games developer  . Smokeless tobacco: Not on file  . Alcohol Use: Yes    Review of Systems  Constitutional: Positive for chills.  Respiratory: Positive for cough and shortness of breath.   Cardiovascular: Negative for chest pain.  All other systems reviewed and are negative.      Allergies  Lopid  Home Medications   Current Outpatient Rx  Name  Route  Sig  Dispense  Refill  . atorvastatin (LIPITOR) 80 MG tablet   Oral   Take 1 tablet (80 mg total) by mouth daily.   30 tablet   11   . calcium-vitamin D (OSCAL 500/200 D-3) 500-200 MG-UNIT per tablet   Oral   Take 1 tablet by mouth daily with breakfast.         . Cholecalciferol (VITAMIN D) 2000 UNITS tablet   Oral   Take 1  tablet (2,000 Units total) by mouth daily.         Marland Kitchen co-enzyme Q-10 30 MG capsule   Oral   Take 1 capsule (30 mg total) by mouth daily.         Marland Kitchen donepezil (ARICEPT) 10 MG tablet               . ipratropium (ATROVENT HFA) 17 MCG/ACT inhaler   Inhalation   Inhale 2 puffs into the lungs every 6 (six) hours.         Marland Kitchen latanoprost (XALATAN) 0.005 % ophthalmic solution               . levofloxacin (LEVAQUIN) 500 MG tablet   Oral   Take 1 tablet (500 mg total) by mouth daily.   10 tablet   0   . Omega-3 Fatty Acids (FISH OIL) 1000 MG CAPS   Oral   Take 1 capsule (1,000 mg total) by mouth 2 (two) times daily.      0   . predniSONE (DELTASONE) 20 MG tablet      Take one pill two times daily for 3 days, take one pill daily for 4 days.   10 tablet   0   . promethazine-dextromethorphan (PROMETHAZINE-DM) 6.25-15 MG/5ML syrup  Oral   Take 5 mLs by mouth 4 (four) times daily as needed for cough.   180 mL   0   . sulindac (CLINORIL) 200 MG tablet   Oral   Take 200 mg by mouth daily.          BP 153/85  Temp(Src) 99.4 F (37.4 C) (Oral)  Resp 18  SpO2 86% Physical Exam  Nursing note and vitals reviewed. Constitutional: He is oriented to person, place, and time. He appears well-developed and well-nourished. No distress.  HENT:  Head: Normocephalic and atraumatic.  Mouth/Throat: Oropharynx is clear and moist.  Eyes: Conjunctivae are normal. Pupils are equal, round, and reactive to light. No scleral icterus.  Neck: Neck supple.  Cardiovascular: Normal rate, regular rhythm, normal heart sounds and intact distal pulses.   No murmur heard. Pulmonary/Chest: Effort normal. No stridor. No respiratory distress. He has decreased breath sounds (mild decreased air movement.). He has no wheezes. He has rales (right middle).  Able to speak in full sentences  Abdominal: Soft. He exhibits no distension. There is no tenderness.  Musculoskeletal: Normal range of motion. He  exhibits no edema.  Neurological: He is alert and oriented to person, place, and time.  Skin: Skin is warm and dry. No rash noted. He is not diaphoretic.  Psychiatric: He has a normal mood and affect. His behavior is normal.    ED Course  Procedures (including critical care time) Labs Review Labs Reviewed  CBC WITH DIFFERENTIAL - Abnormal; Notable for the following:    WBC 11.3 (*)    Neutrophils Relative % 84 (*)    Neutro Abs 9.4 (*)    Lymphocytes Relative 9 (*)    All other components within normal limits  COMPREHENSIVE METABOLIC PANEL - Abnormal; Notable for the following:    Chloride 95 (*)    Glucose, Bld 116 (*)    GFR calc non Af Amer 89 (*)    All other components within normal limits  URINALYSIS, ROUTINE W REFLEX MICROSCOPIC - Abnormal; Notable for the following:    Ketones, ur 15 (*)    All other components within normal limits  I-STAT CG4 LACTIC ACID, ED - Abnormal; Notable for the following:    Lactic Acid, Venous 2.38 (*)    All other components within normal limits  CULTURE, BLOOD (ROUTINE X 2)  CULTURE, BLOOD (ROUTINE X 2)  URINE CULTURE  PRO B NATRIURETIC PEPTIDE  TROPONIN I   Imaging Review Dg Chest Port 1 View  05/03/2013   CLINICAL DATA:  Shortness of breath.  EXAM: PORTABLE CHEST - 1 VIEW  COMPARISON:  DG CHEST 2 VIEW dated 06/18/2011  FINDINGS: The lungs are well-expanded. The interstitial markings are mildly increased. Patchy density is present at the right lung base. The cardiac silhouette is normal in size. The pulmonary vascularity is not engorged. The mediastinum is normal in width. There is mild tortuosity of the descending thoracic aorta.  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 Superimposed atelectasis or pneumonia in the right infrahilar region may be present. The costophrenic gutters are excluded. No large pleural effusions are demonstrated. There may be a low-grade pulmonary interstitial edema of cardiac or noncardiac cause. When the  patient can tolerate the procedure, a PA and lateral chest x-ray would be of value.   Electronically Signed   By: David  [place]   On: 05/03/2013 16:49  All radiology studies independently viewed by me.      EKG Interpretation   Date/Time:  Monday May 03 2013 16:01:18 EDT Ventricular Rate:  131 PR Interval:  122 QRS Duration: 73 QT Interval:  411 QTC Calculation: 607 R Axis:   -70 Text Interpretation:  Sinus tachycardia Left anterior fascicular block  Anteroseptal infarct, age indeterminate ST elevation, consider inferior  injury Prolonged QT interval No old tracing to compare Confirmed by  [facility]  MD, TREY ([id number]) on 05/03/2013 5:41:44 PM      MDM   Final diagnoses:  Pneumonia  Other abnormal glucose  Unspecified essential hypertension    73 yo male with malaise, cough, and shortness of breath for 4 days.  Given Rocephin in clinic prior to transfer to ED.  On exam, tachycardic and hypoxic, with tachypnea, but able to speak in full sentences.  Given albuterol nebs without significant improvement.  CTA chest checked due to severe hypoxia and tachycardia in setting of equivocal chest xray.  It did not show PE.  Azithromycin and fluids administered.  Admitted to internal medicine for CAP.   Candyce Churn III, MD 05/04/13 1247

## 2013-05-03 NOTE — ED Notes (Signed)
Pt arrived via EMS from pmd related to sob. Pt given albuterol x1 and rocephin at pmd office. Pt arrives with visible distress.

## 2013-05-03 NOTE — ED Notes (Signed)
Pt and family informed of plan of care. Pt ready for transport to 6n

## 2013-05-03 NOTE — H&P (Signed)
[facility] History and Physical  Cameron DienesRobert V Mcclain QMV:[id number] DOB: Jun 12, 1940 DOA: 05/03/2013  Referring physician: Blake DivineJohn Wofford, Mcclain PCP: Cameron CorwinMCKEOWN,Cameron DAVID, Mcclain   Chief Complaint: Pneumonia  HPI: Cameron Mcclain is a 73 y.o. male with increasing shortness of breath and cough. Patient states he has been ill for at least a month now. He has had cough which is productive no hemoptysis is reported. He has been having some fevers or at least has felt warm. Also the patient states he has had some chills and headaches with generalized aches and pains. Patient was seen in his PCP office and was given some antibiotics. Today he went to the office and was noted to be hypoxic. He was given a shot of rocephin and was sent to the ED. In the ED the patient still had low saturations on 4lpm oxygen he was about 89%. Chest xray was done and this shows presence of infrahilar atelectasis versus pneumonia. A concern was raised about persistant hypoxia and after discussion with the ED a CT scan of the chest was also ordered with no evidenc of pulmonary embolism but there were noted some areas of increased nodularity with posterior lower lobe collapse or consolidation.   Review of Systems:  Constitutional:  ++weight loss, no night sweats, ++Fevers, ++chills, ++fatigue.  HEENT:  No headaches, Sore throat,  Cardio-vascular:  No chest pain, Orthopnea, PND, swelling in lower extremities, anasarca, dizziness, palpitations  GI:  No heartburn, indigestion, abdominal pain, nausea, vomiting, diarrhea, change in bowel habits, loss of appetite  Resp:  ++shortness of breath with exertion. ++ productive cough, No coughing up of blood. ++wheezing.  Skin:  no rash or lesions.  GU:  no dysuria, change in color of urine, no urgency or frequency. No flank pain.  Musculoskeletal:  No joint pain or swelling. No decreased range of motion. No back pain.  Psych:  No change in mood or affect. No depression or anxiety.  ++ memory loss.   Past Medical History  Diagnosis Date  . Hyperlipidemia   . Hypertension   . Pre-diabetes   . Vitamin D deficiency   . OSA (obstructive sleep apnea)   . RLS (restless legs syndrome)   . Glaucoma    Past Surgical History  Procedure Laterality Date  . Pilonidal cyst / sinus excision  1959  . Axillary Right 1986    biopsy of axillary nodes   Social History:  reports that he has quit smoking. He does not have any smokeless tobacco history on file. He reports that he drinks alcohol. He reports that he does not use illicit drugs.  Allergies  Allergen Reactions  . Benadryl [Diphenhydramine Hcl] Other (See Comments)    Hallucinations, itching, shaking    Family History  Problem Relation Age of Onset  . Hypertension Mother   . Diabetes Mother   . Hypertension Father   . Cancer Father      Prior to Admission medications   Medication Sig Start Date End Date Taking? Authorizing Provider  Calcium-Magnesium-Vitamin D (CALCIUM MAGNESIUM PO) Take 1 tablet by mouth daily.   Yes Historical Provider, Mcclain  Cholecalciferol (VITAMIN D) 2000 UNITS tablet Take 1 tablet (2,000 Units total) by mouth daily. 12/29/12  Yes Cameron CowboyWilliam McKeown, Mcclain  co-enzyme Q-10 30 MG capsule Take 1 capsule (30 mg total) by mouth daily. 12/29/12  Yes Cameron CowboyWilliam McKeown, Mcclain  guaiFENesin (MUCINEX) 600 MG 12 hr tablet Take 600 mg by mouth 2 (two) times daily as needed for cough.  Yes Historical Provider, Mcclain  ipratropium (ATROVENT HFA) 17 MCG/ACT inhaler Inhale 2 puffs into the lungs every 6 (six) hours as needed for wheezing.    Yes Historical Provider, Mcclain  latanoprost (XALATAN) 0.005 % ophthalmic solution Place 1 drop into both eyes at bedtime.  12/23/12  Yes Historical Provider, Mcclain  Omega-3 Fatty Acids (FISH OIL) 1000 MG CAPS Take 1 capsule (1,000 mg total) by mouth 2 (two) times daily. 12/29/12  Yes Cameron Mcclain  levofloxacin (LEVAQUIN) 500 MG tablet Take 1 tablet (500 mg total) by mouth daily. 05/03/13    Quentin Mulling, PA-C  predniSONE (DELTASONE) 20 MG tablet Take one pill two times daily for 3 days, take one pill daily for 4 days. 05/03/13   Quentin Mulling, PA-C  promethazine-dextromethorphan (PROMETHAZINE-DM) 6.25-15 MG/5ML syrup Take 5 mLs by mouth 4 (four) times daily as needed for cough. 05/03/13   Quentin Mulling, PA-C   Physical Exam: Filed Vitals:   05/03/13 1930  BP: 129/74  Pulse: 114  Temp:   Resp: 21    BP 129/74  Pulse 114  Temp(Src) 103.1 F (39.5 C) (Rectal)  Resp 21  SpO2 93%  General:  Appears calm and comfortable Eyes: PERRL, normal lids, irises & conjunctiva ENT: grossly normal hearing, lips & tongue Neck: no LAD, masses or thyromegaly Cardiovascular: RRR, no m/r/g. No LE edema. Telemetry: SR, no arrhythmias  Respiratory: Ronchi bilaterally, Normal respiratory effort. Abdomen: soft, ntnd Skin: no rash or induration seen on limited exam Musculoskeletal: grossly normal tone BUE/BLE Psychiatric: grossly normal mood and affect, speech fluent and appropriate Neurologic: grossly non-focal.          Labs on Admission:  Basic Metabolic Panel:  Recent Labs Lab 05/03/13 1613  NA 137  K 3.9  CL 95*  CO2 24  GLUCOSE 116*  BUN 16  CREATININE 0.75  CALCIUM 9.0   Liver Function Tests:  Recent Labs Lab 05/03/13 1613  AST 29  ALT 19  ALKPHOS 64  BILITOT 0.3  PROT 7.9  ALBUMIN 4.3   No results found for this basename: LIPASE, AMYLASE,  in the last 168 hours No results found for this basename: AMMONIA,  in the last 168 hours CBC:  Recent Labs Lab 05/03/13 1613  WBC 11.3*  NEUTROABS 9.4*  HGB 16.3  HCT 45.4  MCV 93.0  PLT 177   Cardiac Enzymes:  Recent Labs Lab 05/03/13 1613  TROPONINI <0.30    BNP (last 3 results)  Recent Labs  05/03/13 1613  PROBNP 44.7   CBG: No results found for this basename: GLUCAP,  in the last 168 hours  Radiological Exams on Admission: Dg Chest Port 1 View  05/03/2013   CLINICAL DATA:  Shortness  of breath.  EXAM: PORTABLE CHEST - 1 VIEW  COMPARISON:  DG CHEST 2 VIEW dated 06/18/2011  FINDINGS: The lungs are well-expanded. The interstitial markings are mildly increased. Patchy density is present at the right lung base. The cardiac silhouette is normal in size. The pulmonary vascularity is not engorged. The mediastinum is normal in width. There is mild tortuosity of the descending thoracic aorta.  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 Superimposed atelectasis or pneumonia in the right infrahilar region may be present. The costophrenic gutters are excluded. No large pleural effusions are demonstrated. There may be a low-grade pulmonary interstitial edema of cardiac or noncardiac cause. When the patient can tolerate the procedure, a PA and lateral chest x-ray would be of value.   Electronically Signed  By: David  [place]   On: 05/03/2013 16:49    EKG: no Acute changes  Assessment/Plan Active Problems:   Unspecified essential hypertension   Other abnormal glucose   Pneumonia   1. Pneumonia -patient will be started on CAP protocol -will check cultures -he also has likely got COPD and will be given nebs for this -will need outpatient follow up for this  2. Hypertension -currently elevated  -he was recently stopped on his home meds -will reassess blood pressures and consider restarting his benzapril  3. Sleep Apnea -currently stable  4. Pre-diabetes -monitor glucoses -will cover if needed  Code Status: Full Code (must indicate code status--if unknown or must be presumed, indicate so) Family Communication: Wife present in Room (indicate person spoken with, if applicable, with phone number if by telephone) Disposition Plan: Home (indicate anticipated LOS)  Time spent:  [facility] Pager [phone]

## 2013-05-03 NOTE — ED Notes (Signed)
Placed on 3l [place] spo2 improved to 90%

## 2013-05-03 NOTE — ED Notes (Signed)
Return from ct.

## 2013-05-03 NOTE — Progress Notes (Signed)
   Subjective:    Patient ID: Cameron Mcclain, male    DOB: 1940-04-06, 73 y.o.   MRN: [id number]  Cough This is a recurrent problem. Episode onset: 2 weeks. The problem has been gradually worsening. The problem occurs constantly. The cough is productive of purulent sputum. Associated symptoms include chills, a fever, headaches, myalgias, nasal congestion, postnasal drip, rhinorrhea, a sore throat, shortness of breath and wheezing. Pertinent negatives include no chest pain, ear congestion, ear pain, heartburn, hemoptysis, rash, sweats or weight loss. The symptoms are aggravated by exercise. He has tried nothing for the symptoms.      Review of Systems  Constitutional: Positive for fever and chills. Negative for weight loss and diaphoresis.  HENT: Positive for congestion, postnasal drip, rhinorrhea, sinus pressure and sore throat. Negative for ear pain, sneezing, trouble swallowing and voice change.   Eyes: Negative.   Respiratory: Positive for cough, shortness of breath and wheezing. Negative for hemoptysis and chest tightness.   Cardiovascular: Negative.  Negative for chest pain.  Gastrointestinal: Negative.  Negative for heartburn.  Genitourinary: Negative.   Musculoskeletal: Positive for myalgias. Negative for neck pain.  Skin: Negative for rash.  Neurological: Positive for headaches.       Objective:   Physical Exam  Constitutional: He is oriented to person, place, and time. He appears well-developed and well-nourished.  HENT:  Head: Normocephalic and atraumatic.  Right Ear: External ear normal.  Left Ear: External ear normal.  Nose: Nose normal.  Mouth/Throat: Oropharynx is clear and moist.  Eyes: Conjunctivae are normal. Pupils are equal, round, and reactive to light.  Neck: Normal range of motion. Neck supple.  Cardiovascular: Normal rate, regular rhythm and normal heart sounds.   No murmur heard. Pulmonary/Chest: Effort normal. No respiratory distress. He has wheezes. He  has no rales. He exhibits no tenderness.  Abdominal: Soft. Bowel sounds are normal.  Lymphadenopathy:    He has no cervical adenopathy.  Neurological: He is alert and oriented to person, place, and time.  Skin: Skin is warm and dry.       Assessment & Plan:  Shortness of breath, fever, chills- O2 at 90 %, desaturated in the office to 85%, 4L O2 at 90-92% Rocephin shot in the office,  911 called to transport to the hospital Denies CP, needs CXR, EKG

## 2013-05-04 DIAGNOSIS — F0280 Dementia in other diseases classified elsewhere without behavioral disturbance: Secondary | ICD-10-CM

## 2013-05-04 DIAGNOSIS — G309 Alzheimer's disease, unspecified: Secondary | ICD-10-CM

## 2013-05-04 DIAGNOSIS — J9600 Acute respiratory failure, unspecified whether with hypoxia or hypercapnia: Secondary | ICD-10-CM

## 2013-05-04 LAB — BLOOD GAS, ARTERIAL
ACID-BASE DEFICIT: 1 mmol/L (ref 0.0–2.0)
Bicarbonate: 23.5 mEq/L (ref 20.0–24.0)
DRAWN BY: 405301
FIO2: 0.36 %
O2 Saturation: 94.8 %
PATIENT TEMPERATURE: 98.6
PCO2 ART: 41.4 mmHg (ref 35.0–45.0)
TCO2: 24.8 mmol/L (ref 0–100)
pH, Arterial: 7.373 (ref 7.350–7.450)
pO2, Arterial: 74.5 mmHg — ABNORMAL LOW (ref 80.0–100.0)

## 2013-05-04 LAB — COMPREHENSIVE METABOLIC PANEL
ALT: 31 U/L (ref 0–53)
AST: 45 U/L — ABNORMAL HIGH (ref 0–37)
Albumin: 3.4 g/dL — ABNORMAL LOW (ref 3.5–5.2)
Alkaline Phosphatase: 58 U/L (ref 39–117)
BUN: 11 mg/dL (ref 6–23)
CALCIUM: 9 mg/dL (ref 8.4–10.5)
CO2: 23 meq/L (ref 19–32)
Chloride: 99 mEq/L (ref 96–112)
Creatinine, Ser: 0.69 mg/dL (ref 0.50–1.35)
GFR calc Af Amer: >90 mL/min (ref >90)
GLUCOSE: 153 mg/dL — AB (ref 70–99)
Potassium: 4 mEq/L (ref 3.7–5.3)
Sodium: 136 mEq/L — ABNORMAL LOW (ref 137–147)
TOTAL PROTEIN: 6.7 g/dL (ref 6.0–8.3)
Total Bilirubin: 0.4 mg/dL (ref 0.3–1.2)

## 2013-05-04 LAB — LEGIONELLA ANTIGEN, URINE: Legionella Antigen, Urine: NEGATIVE

## 2013-05-04 LAB — INFLUENZA PANEL BY PCR (TYPE A & B)
H1N1FLUPCR: NOT DETECTED
INFLBPCR: NEGATIVE
Influenza A By PCR: NEGATIVE

## 2013-05-04 LAB — CBC
HEMATOCRIT: 42 % (ref 39.0–52.0)
HEMOGLOBIN: 14.8 g/dL (ref 13.0–17.0)
MCH: 32.2 pg (ref 26.0–34.0)
MCHC: 35.2 g/dL (ref 30.0–36.0)
MCV: 91.5 fL (ref 78.0–100.0)
Platelets: 165 10*3/uL (ref 150–400)
RBC: 4.59 MIL/uL (ref 4.22–5.81)
RDW: 12.1 % (ref 11.5–15.5)
WBC: 14.8 10*3/uL — AB (ref 4.0–10.5)

## 2013-05-04 LAB — URINE CULTURE

## 2013-05-04 LAB — PROTIME-INR
INR: 1.16 (ref 0.00–1.49)
Prothrombin Time: 14.6 seconds (ref 11.6–15.2)

## 2013-05-04 LAB — APTT: APTT: 37 s (ref 24–37)

## 2013-05-04 LAB — HIV ANTIBODY (ROUTINE TESTING W REFLEX): HIV: NONREACTIVE

## 2013-05-04 LAB — STREP PNEUMONIAE URINARY ANTIGEN: Strep Pneumo Urinary Antigen: NEGATIVE

## 2013-05-04 MED ORDER — PREDNISONE 50 MG PO TABS
60.0000 mg | ORAL_TABLET | Freq: Two times a day (BID) | ORAL | Status: DC
  Administered 2013-05-04 – 2013-05-05 (×2): 60 mg via ORAL
  Filled 2013-05-04 (×4): qty 1

## 2013-05-04 MED ORDER — IPRATROPIUM-ALBUTEROL 0.5-2.5 (3) MG/3ML IN SOLN
3.0000 mL | Freq: Three times a day (TID) | RESPIRATORY_TRACT | Status: DC
  Administered 2013-05-05 – 2013-05-07 (×7): 3 mL via RESPIRATORY_TRACT
  Filled 2013-05-04 (×7): qty 3

## 2013-05-04 MED ORDER — IPRATROPIUM-ALBUTEROL 0.5-2.5 (3) MG/3ML IN SOLN
3.0000 mL | Freq: Four times a day (QID) | RESPIRATORY_TRACT | Status: DC
  Administered 2013-05-04 (×2): 3 mL via RESPIRATORY_TRACT
  Filled 2013-05-04 (×2): qty 3

## 2013-05-04 MED ORDER — PREDNISONE 50 MG PO TABS
50.0000 mg | ORAL_TABLET | Freq: Two times a day (BID) | ORAL | Status: DC
  Filled 2013-05-04 (×2): qty 1

## 2013-05-04 MED ORDER — DM-GUAIFENESIN ER 30-600 MG PO TB12
1.0000 | ORAL_TABLET | Freq: Two times a day (BID) | ORAL | Status: DC
  Administered 2013-05-04 – 2013-05-07 (×6): 1 via ORAL
  Filled 2013-05-04 (×8): qty 1

## 2013-05-04 NOTE — Progress Notes (Signed)
Utilization Review Completed.Gordana Kewley T3/31/2015  

## 2013-05-04 NOTE — Progress Notes (Signed)
Chart reviewed.   [facility] PROGRESS NOTE  Barry DienesRobert V Basquez ZOX:[id number] DOB: 1940/06/05 DOA: 05/03/2013 PCP: Nadean CorwinMCKEOWN,[name] DAVID, MD  Assessment/Plan:   Community acquiredPneumonia Active Problems:   Unspecified essential hypertension   Other abnormal glucose   Alzheimer's disease Copd exacerbation   Acute respiratory failure  continue rocephin, azithro. Change steroids to po and schedule duonebs  Code Status:  full Family Communication:  wife Disposition Plan:  home  HPI/Subjective: Became extremely SOB earlier today  Objective: Filed Vitals:   05/04/13 1432  BP: 121/80  Pulse: 88  Temp: 98.3 F (36.8 C)  Resp: 18    Intake/Output Summary (Last 24 hours) at 05/04/13 1454 Last data filed at 05/04/13 1433  Gross per 24 hour  Intake 850.83 ml  Output   2025 ml  Net -1174.17 ml   Filed Weights   05/03/13 2055  Weight: 71.215 kg (157 lb)    Exam:   General:  Talkative. comfortable  Cardiovascular: RRR  Respiratory: bilateral mild wheeze. Breathing nonlabored.  Abdomen: S, NT, ND  Ext: no CCE  Basic Metabolic Panel:  Recent Labs Lab 05/03/13 1613 05/04/13 0454  NA 137 136*  K 3.9 4.0  CL 95* 99  CO2 24 23  GLUCOSE 116* 153*  BUN 16 11  CREATININE 0.75 0.69  CALCIUM 9.0 9.0   Liver Function Tests:  Recent Labs Lab 05/03/13 1613 05/04/13 0454  AST 29 45*  ALT 19 31  ALKPHOS 64 58  BILITOT 0.3 0.4  PROT 7.9 6.7  ALBUMIN 4.3 3.4*   No results found for this basename: LIPASE, AMYLASE,  in the last 168 hours No results found for this basename: AMMONIA,  in the last 168 hours CBC:  Recent Labs Lab 05/03/13 1613 05/04/13 0454  WBC 11.3* 14.8*  NEUTROABS 9.4*  --   HGB 16.3 14.8  HCT 45.4 42.0  MCV 93.0 91.5  PLT 177 165   Cardiac Enzymes:  Recent Labs Lab 05/03/13 1613  TROPONINI <0.30   BNP (last 3 results)  Recent Labs  05/03/13 1613  PROBNP 44.7   CBG: No results found for this basename: GLUCAP,  in  the last 168 hours  Recent Results (from the past 240 hour(s))  CULTURE, BLOOD (ROUTINE X 2)     Status: None   Collection Time    05/03/13  4:15 PM      Result Value Ref Range Status   Specimen Description BLOOD HAND RIGHT   Final   Special Requests BOTTLES DRAWN AEROBIC AND ANAEROBIC 3CC   Final   Culture  Setup Time     Final   Value: 05/03/2013 20:36     [facility]   Culture     Final   Value:        BLOOD CULTURE RECEIVED NO GROWTH TO DATE CULTURE WILL BE HELD FOR 5 DAYS BEFORE ISSUING A FINAL NEGATIVE REPORT     Performed at [facility]   Report Status PENDING   Incomplete  CULTURE, BLOOD (ROUTINE X 2)     Status: None   Collection Time    05/03/13  4:15 PM      Result Value Ref Range Status   Specimen Description BLOOD RIGHT FOREARM   Final   Special Requests BOTTLES DRAWN AEROBIC AND ANAEROBIC 5CC   Final   Culture  Setup Time     Final   Value: 05/03/2013 20:36     [facility]formed at [facility]  Culture     Final   Value:        BLOOD CULTURE RECEIVED NO GROWTH TO DATE CULTURE WILL BE HELD FOR 5 DAYS BEFORE ISSUING A FINAL NEGATIVE REPORT     Performed at [facility]   Report Status PENDING   Incomplete     Studies: Ct Angio Chest Pe W/cm &/or Wo Cm  05/03/2013   CLINICAL DATA:  Shortness of breath and hypoxia.  EXAM: CT ANGIOGRAPHY CHEST WITH CONTRAST  TECHNIQUE: Multidetector CT imaging of the chest was performed using the standard protocol during bolus administration of intravenous contrast. Multiplanar CT image reconstructions and MIPs were obtained to evaluate the vascular anatomy.  CONTRAST:  OMNIPAQUE IOHEXOL 350 MG/ML SOLN  COMPARISON:  None.  FINDINGS: There is some motion artifact in the lower lobes, but no filling defect is identified within the pulmonary arteries to suggest the presence of an acute pulmonary embolus. No thoracic aortic aneurysm.  There is no axillary lymphadenopathy. No mediastinal  lymphadenopathy small mediastinal lymph nodes are. This may be a post infectious or inflammatory alveolitis evident the do not meet CT criteria for pathologic enlargement. 10 mm short axis right hilar lymph node is identified. There is a 9 mm short axis lymph node in the left hilum.  No pericardial or pleural effusion. Coronary artery calcification is noted.  Lung windows demonstrate emphysema. There is bibasilar dependent collapse/ consolidation in the lower lobes bilaterally, right slightly more than left. 16 mm ill-defined nodular area of opacity is identified in the left lower lobe on image 66.  Images which include the upper abdomen reveal a 2.2 cm nodule in the right adrenal gland which averages -7 Hounsfield units, compatible with adrenal adenoma. 7 mm water density lesion in the interpolar right kidney cannot be definitively characterize but is probably a cyst. 16 mm low-density lesion in the left kidney is also likely a cyst  Bone windows reveal no worrisome lytic or sclerotic osseous lesions.  Review of the MIP images confirms the above findings.  IMPRESSION: No CT evidence for acute pulmonary embolus. No thoracic aortic aneurysm.  There is posterior lower lobe collapse/consolidation, right slightly more than left. This may be atelectatic, but superimposed pneumonia is a possibility.  16 mm area ill-defined nodular opacity is identified in the left lower lobe with adjacent ill-defined satellite nodules. This may be a focus of infectious or inflammatory alveolitis. Followup CT chest without contrast in 3 months is recommended to assess for resolution.  Borderline mediastinal and hilar lymphadenopathy.   Electronically Signed   By: Kennith Center M.D.   On: 05/03/2013 19:41   Dg Chest Port 1 View  05/03/2013   CLINICAL DATA:  Shortness of breath.  EXAM: PORTABLE CHEST - 1 VIEW  COMPARISON:  DG CHEST 2 VIEW dated 06/18/2011  FINDINGS: The lungs are well-expanded. The interstitial markings are mildly  increased. Patchy density is present at the right lung base. The cardiac silhouette is normal in size. The pulmonary vascularity is not engorged. The mediastinum is normal in width. There is mild tortuosity of the descending thoracic aorta.  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 Superimposed atelectasis or pneumonia in the right infrahilar region may be present. The costophrenic gutters are excluded. No large pleural effusions are demonstrated. There may be a low-grade pulmonary interstitial edema of cardiac or noncardiac cause. When the patient can tolerate the procedure, a PA and lateral chest x-ray would be of value.   Electronically Signed  By: David  [place]   On: 05/03/2013 16:49    Scheduled Meds: . aspirin EC  81 mg Oral Daily  . azithromycin  500 mg Intravenous Q24H  . cefTRIAXone (ROCEPHIN)  IV  1 g Intravenous Q24H  . cholecalciferol  2,000 Units Oral Daily  . docusate sodium  100 mg Oral BID  . heparin  5,000 Units Subcutaneous 3 times per day  . latanoprost  1 drop Both Eyes QHS  . omega-3 acid ethyl esters  1 g Oral Daily  . predniSONE  50 mg Oral BID WC  . sodium chloride  3 mL Intravenous Q12H   Continuous Infusions: . sodium chloride 50 mL/hr at 05/03/13 2155    Time spent: 35 minutes  Christmas Faraci L  [facility] Pager [phone]. If 7PM-7AM, please contact night-coverage at [email or web address], password [facility] 05/04/2013, 2:54 PM  LOS: 1 day

## 2013-05-05 MED ORDER — PREDNISONE 20 MG PO TABS
40.0000 mg | ORAL_TABLET | Freq: Every day | ORAL | Status: DC
  Administered 2013-05-06 – 2013-05-07 (×2): 40 mg via ORAL
  Filled 2013-05-05 (×3): qty 2

## 2013-05-05 MED ORDER — BENZONATATE 100 MG PO CAPS
100.0000 mg | ORAL_CAPSULE | Freq: Three times a day (TID) | ORAL | Status: DC
  Administered 2013-05-05 – 2013-05-07 (×6): 100 mg via ORAL
  Filled 2013-05-05 (×8): qty 1

## 2013-05-05 MED ORDER — CEFUROXIME AXETIL 500 MG PO TABS
500.0000 mg | ORAL_TABLET | Freq: Two times a day (BID) | ORAL | Status: DC
  Administered 2013-05-05 – 2013-05-07 (×4): 500 mg via ORAL
  Filled 2013-05-05 (×6): qty 1

## 2013-05-05 MED ORDER — AZITHROMYCIN 500 MG PO TABS
500.0000 mg | ORAL_TABLET | Freq: Every day | ORAL | Status: DC
  Administered 2013-05-06 – 2013-05-07 (×2): 500 mg via ORAL
  Filled 2013-05-05 (×2): qty 1

## 2013-05-05 NOTE — Progress Notes (Signed)
[facility] PROGRESS NOTE  Cameron Mcclain ZOX:[id number] DOB: 1940-12-10 DOA: 05/03/2013 PCP: Nadean Corwin, MD  Assessment/Plan:   Community acquiredPneumonia Active Problems:   Unspecified essential hypertension   Other abnormal glucose   Alzheimer's disease Copd exacerbation: wean steroids   Acute respiratory failure. Wean oxygen as able  Monitor another 24-48 hours  Code Status:  full Family Communication:  wife Disposition Plan:  home  HPI/Subjective: Became extremely SOB again this am  Objective: Filed Vitals:   05/05/13 0547  BP: 114/81  Pulse: 97  Temp: 98.6 F (37 C)  Resp: 17    Intake/Output Summary (Last 24 hours) at 05/05/13 1336 Last data filed at 05/05/13 1252  Gross per 24 hour  Intake   2095 ml  Output   1625 ml  Net    470 ml   Filed Weights   05/03/13 2055  Weight: 71.215 kg (157 lb)    Exam:   General:  Talkative. comfortable  Cardiovascular: RRR  Respiratory: CTA without WRR  Abdomen: S, NT, ND  Ext: no CCE  Basic Metabolic Panel:  Recent Labs Lab 05/03/13 1613 05/04/13 0454  NA 137 136*  K 3.9 4.0  CL 95* 99  CO2 24 23  GLUCOSE 116* 153*  BUN 16 11  CREATININE 0.75 0.69  CALCIUM 9.0 9.0   Liver Function Tests:  Recent Labs Lab 05/03/13 1613 05/04/13 0454  AST 29 45*  ALT 19 31  ALKPHOS 64 58  BILITOT 0.3 0.4  PROT 7.9 6.7  ALBUMIN 4.3 3.4*   No results found for this basename: LIPASE, AMYLASE,  in the last 168 hours No results found for this basename: AMMONIA,  in the last 168 hours CBC:  Recent Labs Lab 05/03/13 1613 05/04/13 0454  WBC 11.3* 14.8*  NEUTROABS 9.4*  --   HGB 16.3 14.8  HCT 45.4 42.0  MCV 93.0 91.5  PLT 177 165   Cardiac Enzymes:  Recent Labs Lab 05/03/13 1613  TROPONINI <0.30   BNP (last 3 results)  Recent Labs  05/03/13 1613  PROBNP 44.7   CBG: No results found for this basename: GLUCAP,  in the last 168 hours  Recent Results (from the past 240  hour(s))  CULTURE, BLOOD (ROUTINE X 2)     Status: None   Collection Time    05/03/13  4:15 PM      Result Value Ref Range Status   Specimen Description BLOOD HAND RIGHT   Final   Special Requests BOTTLES DRAWN AEROBIC AND ANAEROBIC 3CC   Final   Culture  Setup Time     Final   Value: 05/03/2013 20:36     Performed at [facility]   Culture     Final   Value:        BLOOD CULTURE RECEIVED NO GROWTH TO DATE CULTURE WILL BE HELD FOR 5 DAYS BEFORE ISSUING A FINAL NEGATIVE REPORT     Performed at [facility]   Report Status PENDING   Incomplete  CULTURE, BLOOD (ROUTINE X 2)     Status: None   Collection Time    05/03/13  4:15 PM      Result Value Ref Range Status   Specimen Description BLOOD RIGHT FOREARM   Final   Special Requests BOTTLES DRAWN AEROBIC AND ANAEROBIC 5CC   Final   Culture  Setup Time     Final   Value: 05/03/2013 20:36     Performed at [facility]  Culture     Final   Value:        BLOOD CULTURE RECEIVED NO GROWTH TO DATE CULTURE WILL BE HELD FOR 5 DAYS BEFORE ISSUING A FINAL NEGATIVE REPORT     Performed at [facility]   Report Status PENDING   Incomplete  URINE CULTURE     Status: None   Collection Time    05/03/13  4:44 PM      Result Value Ref Range Status   Specimen Description URINE, RANDOM   Final   Special Requests NONE   Final   Culture  Setup Time     Final   Value: 05/03/2013 23:58     Performed at [facility] Count     Final   Value: 8,000 COLONIES/ML     Performed at [facility]   Culture     Final   Value: INSIGNIFICANT GROWTH     Performed at [facility]   Report Status 05/04/2013 FINAL   Final     Studies: Ct Angio Chest Pe W/cm &/or Wo Cm  05/03/2013   CLINICAL DATA:  Shortness of breath and hypoxia.  EXAM: CT ANGIOGRAPHY CHEST WITH CONTRAST  TECHNIQUE: Multidetector CT imaging of the chest was performed using the standard protocol during bolus administration of  intravenous contrast. Multiplanar CT image reconstructions and MIPs were obtained to evaluate the vascular anatomy.  CONTRAST:  OMNIPAQUE IOHEXOL 350 MG/ML SOLN  COMPARISON:  None.  FINDINGS: There is some motion artifact in the lower lobes, but no filling defect is identified within the pulmonary arteries to suggest the presence of an acute pulmonary embolus. No thoracic aortic aneurysm.  There is no axillary lymphadenopathy. No mediastinal lymphadenopathy small mediastinal lymph nodes are. This may be a post infectious or inflammatory alveolitis evident the do not meet CT criteria for pathologic enlargement. 10 mm short axis right hilar lymph node is identified. There is a 9 mm short axis lymph node in the left hilum.  No pericardial or pleural effusion. Coronary artery calcification is noted.  Lung windows demonstrate emphysema. There is bibasilar dependent collapse/ consolidation in the lower lobes bilaterally, right slightly more than left. 16 mm ill-defined nodular area of opacity is identified in the left lower lobe on image 66.  Images which include the upper abdomen reveal a 2.2 cm nodule in the right adrenal gland which averages -7 Hounsfield units, compatible with adrenal adenoma. 7 mm water density lesion in the interpolar right kidney cannot be definitively characterize but is probably a cyst. 16 mm low-density lesion in the left kidney is also likely a cyst  Bone windows reveal no worrisome lytic or sclerotic osseous lesions.  Review of the MIP images confirms the above findings.  IMPRESSION: No CT evidence for acute pulmonary embolus. No thoracic aortic aneurysm.  There is posterior lower lobe collapse/consolidation, right slightly more than left. This may be atelectatic, but superimposed pneumonia is a possibility.  16 mm area ill-defined nodular opacity is identified in the left lower lobe with adjacent ill-defined satellite nodules. This may be a focus of infectious or inflammatory  alveolitis. Followup CT chest without contrast in 3 months is recommended to assess for resolution.  Borderline mediastinal and hilar lymphadenopathy.   Electronically Signed   By: Kennith Center M.D.   On: 05/03/2013 19:41   Dg Chest Port 1 View  05/03/2013   CLINICAL DATA:  Shortness of breath.  EXAM: PORTABLE CHEST - 1  VIEW  COMPARISON:  DG CHEST 2 VIEW dated 06/18/2011  FINDINGS: The lungs are well-expanded. The interstitial markings are mildly increased. Patchy density is present at the right lung base. The cardiac silhouette is normal in size. The pulmonary vascularity is not engorged. The mediastinum is normal in width. There is mild tortuosity of the descending thoracic aorta.  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 Superimposed atelectasis or pneumonia in the right infrahilar region may be present. The costophrenic gutters are excluded. No large pleural effusions are demonstrated. There may be a low-grade pulmonary interstitial edema of cardiac or noncardiac cause. When the patient can tolerate the procedure, a PA and lateral chest x-ray would be of value.   Electronically Signed   By: David  [place]   On: 05/03/2013 16:49    Scheduled Meds: . aspirin EC  81 mg Oral Daily  . azithromycin  500 mg Intravenous Q24H  . cefTRIAXone (ROCEPHIN)  IV  1 g Intravenous Q24H  . cholecalciferol  2,000 Units Oral Daily  . dextromethorphan-guaiFENesin  1 tablet Oral BID  . docusate sodium  100 mg Oral BID  . heparin  5,000 Units Subcutaneous 3 times per day  . ipratropium-albuterol  3 mL Nebulization TID  . latanoprost  1 drop Both Eyes QHS  . omega-3 acid ethyl esters  1 g Oral Daily  . predniSONE  60 mg Oral BID WC  . sodium chloride  3 mL Intravenous Q12H   Continuous Infusions: . sodium chloride 50 mL/hr at 05/04/13 1836    Time spent: 25 minutes  Cameron Mcclain  [facility] Pager [phone]. If 7PM-7AM, please contact night-coverage at [email or web address], password  [email or web address] 05/05/2013, 1:36 PM  LOS: 2 days

## 2013-05-06 ENCOUNTER — Inpatient Hospital Stay (HOSPITAL_COMMUNITY): Payer: Medicare HMO

## 2013-05-06 MED ORDER — LORAZEPAM 2 MG/ML IJ SOLN: 1.0000 mg | Freq: Four times a day (QID) | INTRAMUSCULAR | Status: DC | PRN

## 2013-05-06 MED ORDER — METHYLPREDNISOLONE SODIUM SUCC 40 MG IJ SOLR
40.0000 mg | Freq: Once | INTRAMUSCULAR | Status: AC
  Administered 2013-05-06: 40 mg via INTRAVENOUS

## 2013-05-06 MED ORDER — FUROSEMIDE 40 MG PO TABS
40.0000 mg | ORAL_TABLET | Freq: Once | ORAL | Status: AC
  Administered 2013-05-06: 40 mg via ORAL
  Filled 2013-05-06: qty 1

## 2013-05-06 MED ORDER — METHYLPREDNISOLONE SODIUM SUCC 40 MG IJ SOLR
40.0000 mg | Freq: Once | INTRAMUSCULAR | Status: DC
  Filled 2013-05-06: qty 1

## 2013-05-06 NOTE — Progress Notes (Signed)
Patient evaluated for community based chronic disease management services with [facility] as a benefit of patient's [employer]. Spoke with patient at bedside to explain [facility].  Patient has short term memory issues and would like his wife to review the consent before accepting services.  Left contact information and [facility] literature at bedside for his spouse to review.  Made Izell CarolinaHeather Wiles Inpatient Case Manager aware that [facility] Care Management has consulted and the status of the referral. Will await contact from wife to engage.  Of note, [facility] services does not replace or interfere with any services that are arranged by inpatient case management or social work.  For additional questions or referrals please contact Anibal Hendersonim Henderson BSN RN [facility] Hospital Liaison at [phone].

## 2013-05-07 DIAGNOSIS — J441 Chronic obstructive pulmonary disease with (acute) exacerbation: Secondary | ICD-10-CM

## 2013-05-07 MED ORDER — BENZONATATE 100 MG PO CAPS: 100.0000 mg | ORAL_CAPSULE | Freq: Three times a day (TID) | ORAL | Status: DC | PRN

## 2013-05-07 MED ORDER — CEFUROXIME AXETIL 500 MG PO TABS: 500.0000 mg | ORAL_TABLET | Freq: Two times a day (BID) | ORAL | Status: DC

## 2013-05-07 MED ORDER — FLUTICASONE-SALMETEROL 115-21 MCG/ACT IN AERO: 2.0000 | INHALATION_SPRAY | Freq: Two times a day (BID) | RESPIRATORY_TRACT | Status: DC

## 2013-05-07 MED ORDER — PREDNISONE 10 MG PO TABS: ORAL_TABLET | ORAL | Status: DC

## 2013-05-07 NOTE — Progress Notes (Signed)
Patient oxygen sat dropped to 86% on room air at rest, O2 started at 2LPM and O2 sat up to 92%.

## 2013-05-07 NOTE — Discharge Summary (Signed)
Physician Discharge Summary  Barry DienesRobert V Malizia JYN:[id number]:[id number] DOB: 1940-06-23 DOA: 05/03/2013  PCP: Nadean CorwinMCKEOWN,[name] DAVID, MD  Admit date: 05/03/2013 Discharge date: 05/07/2013  Time spent: greater than 30 min  Recommendations for Outpatient Follow-up:   Discharge Diagnoses:    CAP (community acquired pneumonia)   Acute respiratory failure   COPD exacerbation   Unspecified essential hypertension   Alzheimer's disease  Discharge Condition: stable  Filed Weights   05/03/13 2055  Weight: 71.215 kg (157 lb)    History of present illness:  73 y.o. male with increasing shortness of breath and cough. Patient states he has been ill for at least a month now. He has had cough which is productive no hemoptysis is reported. He has been having some fevers or at least has felt warm. Also the patient states he has had some chills and headaches with generalized aches and pains. Patient was seen in his PCP office and was given some antibiotics. Today he went to the office and was noted to be hypoxic. He was given a shot of rocephin and was sent to the ED. In the ED the patient still had low saturations on 4lpm oxygen he was about 89%. Chest xray was done and this shows presence of infrahilar atelectasis versus pneumonia. A concern was raised about persistant hypoxia and after discussion with the ED a CT scan of the chest was also ordered with no evidenc of pulmonary embolism but there were noted some areas of increased nodularity with posterior lower lobe collapse or consolidation.  Hospital Course:  Admitted to hospitalists, started on rocephin, azithromycin, steroids, bronchodilators, oxygen. Cough improved, wheezing resolved by discharge. Able to ambulate without difficulty.  Still hypoxic to 86% on RA, so home oxygen arranged. Will need f/u with PCP to reevaluate need for oxygen.  Procedures:  none  Consultations:  none  Discharge Exam: Filed Vitals:   05/07/13 0541  BP: 144/83  Pulse: 105   Temp: 98.5 F (36.9 C)  Resp: 15    General: comfortable. Breathing nonlabored Cardiovascular: RRR without MGR Respiratory: CTA without WRR  Discharge Instructions You were cared for by a hospitalist during your hospital stay. If you have any questions about your discharge medications or the care you received while you were in the hospital after you are discharged, you can call the unit and asked to speak with the hospitalist on call if the hospitalist that took care of you is not available. Once you are discharged, your primary care physician will handle any further medical issues. Please note that NO REFILLS for any discharge medications will be authorized once you are discharged, as it is imperative that you return to your primary care physician (or establish a relationship with a primary care physician if you do not have one) for your aftercare needs so that they can reassess your need for medications and monitor your lab values.  Discharge Orders   Future Appointments Provider Department Dept Phone   07/01/2013 2:30 PM Lucky CowboyWilliam McKeown, MD [place] [facility] [phone]   01/05/2014 2:00 PM Lucky CowboyWilliam McKeown, MD [place] [facility] [phone]   Future Orders Complete By Expires   Diet general  As directed    Increase activity slowly  As directed        Medication List    STOP taking these medications       ipratropium 17 MCG/ACT inhaler  Commonly known as:  ATROVENT HFA     levofloxacin 500 MG tablet  Commonly known as:  LEVAQUIN      TAKE these medications       benzonatate 100 MG capsule  Commonly known as:  TESSALON  Take 1-2 capsules (100-200 mg total) by mouth 3 (three) times daily as needed for cough.     CALCIUM MAGNESIUM PO  Take 1 tablet by mouth daily.     cefUROXime 500 MG tablet  Commonly known as:  CEFTIN  Take 1 tablet (500 mg total) by mouth 2 (two) times daily with a meal.     co-enzyme Q-10 30  MG capsule  Take 1 capsule (30 mg total) by mouth daily.     Fish Oil 1000 MG Caps  Take 1 capsule (1,000 mg total) by mouth 2 (two) times daily.     fluticasone-salmeterol 115-21 MCG/ACT inhaler  Commonly known as:  ADVAIR HFA  Inhale 2 puffs into the lungs 2 (two) times daily. Rinse mouth after each use     guaiFENesin 600 MG 12 hr tablet  Commonly known as:  MUCINEX  Take 600 mg by mouth 2 (two) times daily as needed for cough.     latanoprost 0.005 % ophthalmic solution  Commonly known as:  XALATAN  Place 1 drop into both eyes at bedtime.     predniSONE 10 MG tablet  Commonly known as:  DELTASONE  3 tablets daily for 2 days, then 2 tablets daily for 2 days, then 1 tablet daily for 2 days     promethazine-dextromethorphan 6.25-15 MG/5ML syrup  Commonly known as:  PROMETHAZINE-DM  Take 5 mLs by mouth 4 (four) times daily as needed for cough.     Vitamin D 2000 UNITS tablet  Take 1 tablet (2,000 Units total) by mouth daily.       Allergies  Allergen Reactions  . Benadryl [Diphenhydramine Hcl] Other (See Comments)    Hallucinations, itching, shaking       Follow-up Information   Follow up with MCKEOWN,[name] DAVID, MD In 3 weeks.   Specialty:  Internal Medicine   Contact information:   [address] [place] [address] [phone]        The results of significant diagnostics from this hospitalization (including imaging, microbiology, ancillary and laboratory) are listed below for reference.    Significant Diagnostic Studies: Ct Angio Chest Pe W/cm &/or Wo Cm  05/03/2013   CLINICAL DATA:  Shortness of breath and hypoxia.  EXAM: CT ANGIOGRAPHY CHEST WITH CONTRAST  TECHNIQUE: Multidetector CT imaging of the chest was performed using the standard protocol during bolus administration of intravenous contrast. Multiplanar CT image reconstructions and MIPs were obtained to evaluate the vascular anatomy.  CONTRAST:  OMNIPAQUE IOHEXOL 350 MG/ML  SOLN  COMPARISON:  None.  FINDINGS: There is some motion artifact in the lower lobes, but no filling defect is identified within the pulmonary arteries to suggest the presence of an acute pulmonary embolus. No thoracic aortic aneurysm.  There is no axillary lymphadenopathy. No mediastinal lymphadenopathy small mediastinal lymph nodes are. This may be a post infectious or inflammatory alveolitis evident the do not meet CT criteria for pathologic enlargement. 10 mm short axis right hilar lymph node is identified. There is a 9 mm short axis lymph node in the left hilum.  No pericardial or pleural effusion. Coronary artery calcification is noted.  Lung windows demonstrate emphysema. There is bibasilar dependent collapse/ consolidation in the lower lobes bilaterally, right slightly more than left. 16 mm ill-defined nodular area of opacity is identified in the left lower  lobe on image 66.  Images which include the upper abdomen reveal a 2.2 cm nodule in the right adrenal gland which averages -7 Hounsfield units, compatible with adrenal adenoma. 7 mm water density lesion in the interpolar right kidney cannot be definitively characterize but is probably a cyst. 16 mm low-density lesion in the left kidney is also likely a cyst  Bone windows reveal no worrisome lytic or sclerotic osseous lesions.  Review of the MIP images confirms the above findings.  IMPRESSION: No CT evidence for acute pulmonary embolus. No thoracic aortic aneurysm.  There is posterior lower lobe collapse/consolidation, right slightly more than left. This may be atelectatic, but superimposed pneumonia is a possibility.  16 mm area ill-defined nodular opacity is identified in the left lower lobe with adjacent ill-defined satellite nodules. This may be a focus of infectious or inflammatory alveolitis. Followup CT chest without contrast in 3 months is recommended to assess for resolution.  Borderline mediastinal and hilar lymphadenopathy.   Electronically  Signed   By: Kennith Center M.D.   On: 05/03/2013 19:41   Dg Chest Port 1 View  05/06/2013   CLINICAL DATA:  Follow-up pneumonia, worsening shortness of breath.  EXAM: PORTABLE CHEST - 1 VIEW  COMPARISON:  CT ANGIO CHEST W/CM &/OR WO/CM dated 05/03/2013; DG CHEST 1V PORT dated 05/03/2013  FINDINGS: Cardiac silhouette is unremarkable. Mediastinal silhouette is nonsuspicious, moderately calcified aortic knob. Similar diffuse interstitial prominence with minimal patchy airspace opacity projecting in left lung base. No pleural effusion. No pneumothorax. Costophrenic angles are incompletely imaged. Soft tissue planes and included osseous structures are nonsuspicious.  IMPRESSION: COPD, with minimal patchy airspace opacity in left lung base corresponding to CT abnormality.   Electronically Signed   By: Awilda Metro   On: 05/06/2013 06:28   Dg Chest Port 1 View  05/03/2013   CLINICAL DATA:  Shortness of breath.  EXAM: PORTABLE CHEST - 1 VIEW  COMPARISON:  DG CHEST 2 VIEW dated 06/18/2011  FINDINGS: The lungs are well-expanded. The interstitial markings are mildly increased. Patchy density is present at the right lung base. The cardiac silhouette is normal in size. The pulmonary vascularity is not engorged. The mediastinum is normal in width. There is mild tortuosity of the descending thoracic aorta.  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 Superimposed atelectasis or pneumonia in the right infrahilar region may be present. The costophrenic gutters are excluded. No large pleural effusions are demonstrated. There may be a low-grade pulmonary interstitial edema of cardiac or noncardiac cause. When the patient can tolerate the procedure, a PA and lateral chest x-ray would be of value.   Electronically Signed   By: David  [place]   On: 05/03/2013 16:49    Microbiology: Recent Results (from the past 240 hour(s))  CULTURE, BLOOD (ROUTINE X 2)     Status: None   Collection Time    05/03/13  4:15 PM       Result Value Ref Range Status   Specimen Description BLOOD HAND RIGHT   Final   Special Requests BOTTLES DRAWN AEROBIC AND ANAEROBIC 3CC   Final   Culture  Setup Time     Final   Value: 05/03/2013 20:36     Performed at [facility]   Culture     Final   Value:        BLOOD CULTURE RECEIVED NO GROWTH TO DATE CULTURE WILL BE HELD FOR 5 DAYS BEFORE ISSUING A FINAL NEGATIVE REPORT     Performed  at [facility]   Report Status PENDING   Incomplete  CULTURE, BLOOD (ROUTINE X 2)     Status: None   Collection Time    05/03/13  4:15 PM      Result Value Ref Range Status   Specimen Description BLOOD RIGHT FOREARM   Final   Special Requests BOTTLES DRAWN AEROBIC AND ANAEROBIC 5CC   Final   Culture  Setup Time     Final   Value: 05/03/2013 20:36     Performed at [facility]   Culture     Final   Value:        BLOOD CULTURE RECEIVED NO GROWTH TO DATE CULTURE WILL BE HELD FOR 5 DAYS BEFORE ISSUING A FINAL NEGATIVE REPORT     Performed at [facility]   Report Status PENDING   Incomplete  URINE CULTURE     Status: None   Collection Time    05/03/13  4:44 PM      Result Value Ref Range Status   Specimen Description URINE, RANDOM   Final   Special Requests NONE   Final   Culture  Setup Time     Final   Value: 05/03/2013 23:58     Performed at [facility] Count     Final   Value: 8,000 COLONIES/ML     Performed at [facility]   Culture     Final   Value: INSIGNIFICANT GROWTH     Performed at [facility]   Report Status 05/04/2013 FINAL   Final     Labs: Basic Metabolic Panel:  Recent Labs Lab 05/03/13 1613 05/04/13 0454  NA 137 136*  K 3.9 4.0  CL 95* 99  CO2 24 23  GLUCOSE 116* 153*  BUN 16 11  CREATININE 0.75 0.69  CALCIUM 9.0 9.0   Liver Function Tests:  Recent Labs Lab 05/03/13 1613 05/04/13 0454  AST 29 45*  ALT 19 31  ALKPHOS 64 58  BILITOT 0.3 0.4  PROT 7.9 6.7  ALBUMIN 4.3 3.4*   No  results found for this basename: LIPASE, AMYLASE,  in the last 168 hours No results found for this basename: AMMONIA,  in the last 168 hours CBC:  Recent Labs Lab 05/03/13 1613 05/04/13 0454  WBC 11.3* 14.8*  NEUTROABS 9.4*  --   HGB 16.3 14.8  HCT 45.4 42.0  MCV 93.0 91.5  PLT 177 165   Cardiac Enzymes:  Recent Labs Lab 05/03/13 1613  TROPONINI <0.30   BNP: BNP (last 3 results)  Recent Labs  05/03/13 1613  PROBNP 44.7   CBG: No results found for this basename: GLUCAP,  in the last 168 hours     Signed:  Frandy Basnett L  [facility] 05/07/2013, 10:21 AM

## 2013-05-07 NOTE — Progress Notes (Signed)
Discharge home with portable oxygen. Home discharge instruction given to wife and patient, no questions verbalized.

## 2013-05-07 NOTE — Care Management Note (Signed)
    Page 1 of 1   05/07/2013     11:06:20 AM   CARE MANAGEMENT NOTE 05/07/2013  Patient:  Barry DienesOWELL,Ion V   Account Number:  [id number]  Date Initiated:  05/07/2013  Documentation initiated by:  Ronny FlurryWILE,Amiri Tritch  Subjective/Objective Assessment:     Action/Plan:   Anticipated DC Date:  05/07/2013   Anticipated DC Plan:  HOME W HOME HEALTH SERVICES         Choice offered to / List presented to:  C-3 Spouse   DME arranged  [facility]      DME agency  [facility]     HH arranged  HH[facility] RN      [facility]   Status of service:   Medicare Important Message given?   (If response is "NO", the following Medicare IM given date fields will be blank) Date Medicare IM given:   Date Additional Medicare IM given:    Discharge Disposition:    Per UR Regulation:    If discussed at Long Length of Stay Meetings, dates discussed:    Comments:  05-07-13 Confirmed face sheet information with wife .  [facility] phone [phone] , fax [phone] . [facility] is [facility] today . Ronny FlurryHeather Randen Kauth RN BSN [phone]

## 2013-05-09 LAB — CULTURE, BLOOD (ROUTINE X 2)
CULTURE: NO GROWTH
Culture: NO GROWTH

## 2013-05-13 ENCOUNTER — Telehealth: Payer: Self-pay | Admitting: *Deleted

## 2013-05-13 NOTE — Telephone Encounter (Signed)
[facility]'s O2sat level. O2sat 97%, even after walking.  Patient feels well and would like to discontinue O2.  [facility]ed to [facility] to discontinue and pick up O2.

## 2013-05-13 NOTE — Telephone Encounter (Signed)
Spouse called and is concerned about discontinuing O2 at this time.  She asked to keep O2 a little longer to see how he does without it.  Refaxed order to [facility] to cancel discontinue order at this time.

## 2013-07-01 ENCOUNTER — Encounter: Payer: Self-pay | Admitting: Internal Medicine

## 2013-07-01 ENCOUNTER — Ambulatory Visit (INDEPENDENT_AMBULATORY_CARE_PROVIDER_SITE_OTHER): Payer: Commercial Managed Care - HMO | Admitting: Internal Medicine

## 2013-07-01 VITALS — BP 124/70 | HR 72 | Temp 97.9°F | Resp 16 | Ht 68.75 in | Wt 161.2 lb

## 2013-07-01 DIAGNOSIS — E782 Mixed hyperlipidemia: Secondary | ICD-10-CM

## 2013-07-01 DIAGNOSIS — R7309 Other abnormal glucose: Secondary | ICD-10-CM

## 2013-07-01 DIAGNOSIS — I10 Essential (primary) hypertension: Secondary | ICD-10-CM

## 2013-07-01 DIAGNOSIS — Z79899 Other long term (current) drug therapy: Secondary | ICD-10-CM | POA: Insufficient documentation

## 2013-07-01 DIAGNOSIS — E559 Vitamin D deficiency, unspecified: Secondary | ICD-10-CM

## 2013-07-01 LAB — CBC WITH DIFFERENTIAL/PLATELET
BASOS PCT: 1 % (ref 0–1)
Basophils Absolute: 0.1 10*3/uL (ref 0.0–0.1)
EOS PCT: 3 % (ref 0–5)
Eosinophils Absolute: 0.2 10*3/uL (ref 0.0–0.7)
HCT: 41.6 % (ref 39.0–52.0)
HEMOGLOBIN: 14.4 g/dL (ref 13.0–17.0)
LYMPHS ABS: 3.1 10*3/uL (ref 0.7–4.0)
Lymphocytes Relative: 47 % — ABNORMAL HIGH (ref 12–46)
MCH: 32.1 pg (ref 26.0–34.0)
MCHC: 34.6 g/dL (ref 30.0–36.0)
MCV: 92.9 fL (ref 78.0–100.0)
MONOS PCT: 9 % (ref 3–12)
Monocytes Absolute: 0.6 10*3/uL (ref 0.1–1.0)
NEUTROS PCT: 40 % — AB (ref 43–77)
Neutro Abs: 2.7 10*3/uL (ref 1.7–7.7)
Platelets: 243 10*3/uL (ref 150–400)
RBC: 4.48 MIL/uL (ref 4.22–5.81)
RDW: 13.8 % (ref 11.5–15.5)
WBC: 6.7 10*3/uL (ref 4.0–10.5)

## 2013-07-01 LAB — HEMOGLOBIN A1C
HEMOGLOBIN A1C: 5.4 % (ref <5.7)
Mean Plasma Glucose: 108 mg/dL (ref <117)

## 2013-07-01 NOTE — Patient Instructions (Signed)

## 2013-07-01 NOTE — Progress Notes (Signed)
Patient ID: Cameron Mcclain, male   DOB: 1940/04/08, 73 y.o.   MRN: [id number]    This very nice 73 y.o.MWM presents for 3 month follow up with Hypertension, Hyperlipidemia, Pre-Diabetes and Vitamin D Deficiency. Patient also has mild SDAT and is monitored /supervised by his wife. Thus far , he remains functional in his ALD's and personal needs, altho he has stopped driving a car. Also He was recentl hospitalized mar 30- Apr 3 with a CAP and recovered uneventfully w/o any current respiratory Sx's.   HTN predates since 1998. BP has been controlled at home. Today's BP: 124/70 mmHg. Patient denies any cardiac type chest pain, palpitations, dyspnea/orthopnea/PND, dizziness, claudication, or dependent edema.   Hyperlipidemia is not controlled with diet with patient off of his statin per his wife for concern that it contributes to his progressive memory loss of his already Dx'd dementia. Last Lipids as below.  Lab Results  Component Value Date   CHOL 230* 12/29/2012   HDL 56 12/29/2012   LDLCALC 150* 12/29/2012   TRIG 119 12/29/2012   CHOLHDL 4.1 12/29/2012    Also, the patient has history of PreDiabetes with A1c 5.8% in Nov 2011 and last A1c of  5.6% in Nov 2014. Patient denies any symptoms of reactive hypoglycemia, diabetic polys, paresthesias or visual blurring.   Further, Patient has history of Vitamin D Deficiency of 24 in 2008 and last vitamin D was 82 in 2014. Patient supplements vitamin D without any suspected side-effects.   Medication List   CALCIUM MAGNESIUM PO  Take 1 tablet by mouth daily.     co-enzyme Q-10 30 MG capsule  Take 1 capsule (30 mg total) by mouth daily.     Fish Oil 1000 MG Caps  Take 1 capsule (1,000 mg total) by mouth 2 (two) times daily.     fluticasone-salmeterol 115-21 MCG/ACT inhaler  Commonly known as:  ADVAIR HFA  Inhale 2 puffs into the lungs 2 (two) times daily. Rinse mouth after each use     latanoprost 0.005 % ophthalmic solution  Commonly known as:   XALATAN  Place 1 drop into both eyes at bedtime.     SUPER B COMPLEX PO  Take by mouth daily.     Vitamin D 2000 UNITS tablet  Take 1 tablet (2,000 Units total) by mouth daily.       Allergies  Allergen Reactions  . Benadryl [Diphenhydramine Hcl] Other (See Comments)    Hallucinations, itching, shaking   PMHx:   Past Medical History  Diagnosis Date  . Hyperlipidemia   . Hypertension   . Pre-diabetes   . Vitamin D deficiency   . OSA (obstructive sleep apnea)   . RLS (restless legs syndrome)   . Glaucoma    FHx:    Reviewed / unchanged  SHx:    Reviewed / unchanged   Systems Review: Constitutional: Denies fever, chills, wt changes, headaches, insomnia, fatigue, night sweats, change in appetite. Eyes: Denies redness, blurred vision, diplopia, discharge, itchy, watery eyes.  ENT: Denies discharge, congestion, post nasal drip, epistaxis, sore throat, earache, hearing loss, dental pain, tinnitus, vertigo, sinus pain, snoring.  CV: Denies chest pain, palpitations, irregular heartbeat, syncope, dyspnea, diaphoresis, orthopnea, PND, claudication or edema. Respiratory: denies cough, dyspnea, DOE, pleurisy, hoarseness, laryngitis, wheezing.  Gastrointestinal: Denies dysphagia, odynophagia, heartburn, reflux, water brash, abdominal pain or cramps, nausea, vomiting, bloating, diarrhea, constipation, hematemesis, melena, hematochezia  or hemorrhoids. Genitourinary: Denies dysuria, frequency, urgency, nocturia, hesitancy, discharge, hematuria or flank pain. Musculoskeletal: Denies  arthralgias, myalgias, stiffness, jt. swelling, pain, limping or strain/sprain.  Skin: Denies pruritus, rash, hives, warts, acne, eczema or change in skin lesion(s). Neuro: No weakness, tremor, incoordination, spasms, paresthesia or pain. Psychiatric: Denies confusion, memory loss or sensory loss. Endo: Denies change in weight, skin or hair change.  Heme/Lymph: No excessive bleeding, bruising or enlarged lymph  nodes.  Exam:  BP 124/70      P  72  T 97.9 F   Resp 16  Ht 5' 8.75"   Wt 161 lb 3.2 oz   BMI 23.99 kg/m2  Appears well nourished - in no distress. Eyes: PERRLA, EOMs, conjunctiva no swelling or erythema. Sinuses: No frontal/maxillary tenderness ENT/Mouth: EAC's clear, TM's nl w/o erythema, bulging. Nares clear w/o erythema, swelling, exudates. Oropharynx clear without erythema or exudates. Oral hygiene is good. Tongue normal, non obstructing. Hearing intact.  Neck: Supple. Thyroid nl. Car 2+/2+ without bruits, nodes or JVD. Chest: Respirations nl with BS clear & equal w/o rales, rhonchi, wheezing or stridor.  Cor: Heart sounds normal w/ regular rate and rhythm without sig. murmurs, gallops, clicks, or rubs. Peripheral pulses normal and equal  without edema.  Abdomen: Soft & bowel sounds normal. Non-tender w/o guarding, rebound, hernias, masses, or organomegaly.  Lymphatics: Unremarkable.  Musculoskeletal: Full ROM all peripheral extremities, joint stability, 5/5 strength, and normal gait.  Skin: Warm, dry without exposed rashes, lesions or ecchymosis apparent.  Neuro: Cranial nerves intact, reflexes equal bilaterally. Sensory-motor testing grossly intact. Tendon reflexes grossly intact.  Pysch: Alert & oriented x 3. Insight and judgement nl & appropriate. No ideations.  Assessment and Plan:  1. Hypertension - Continue monitor blood pressure at home. Continue diet/meds same.  2. Hyperlipidemia - Continue diet/meds, exercise,& lifestyle modifications. Continue monitor periodic cholesterol/liver & renal functions   3. Pre-diabetes - Continue diet, exercise, lifestyle modifications. Monitor appropriate labs.  4. Vitamin D Deficiency - Continue supplementation.  5. SDAT  Recommended regular exercise, BP monitoring, weight control, and discussed med and SE's. Recommended labs to assess and monitor clinical status. Further disposition pending results of labs.

## 2013-07-02 LAB — LIPID PANEL
Cholesterol: 242 mg/dL — ABNORMAL HIGH (ref 0–200)
HDL: 53 mg/dL (ref >39)
LDL Cholesterol: 162 mg/dL — ABNORMAL HIGH (ref 0–99)
Total CHOL/HDL Ratio: 4.6 Ratio
Triglycerides: 134 mg/dL (ref <150)
VLDL: 27 mg/dL (ref 0–40)

## 2013-07-02 LAB — HEPATIC FUNCTION PANEL
ALBUMIN: 3.9 g/dL (ref 3.5–5.2)
ALT: 16 U/L (ref 0–53)
AST: 22 U/L (ref 0–37)
Alkaline Phosphatase: 44 U/L (ref 39–117)
Bilirubin, Direct: 0.1 mg/dL (ref 0.0–0.3)
Indirect Bilirubin: 0.6 mg/dL (ref 0.2–1.2)
TOTAL PROTEIN: 6.2 g/dL (ref 6.0–8.3)
Total Bilirubin: 0.7 mg/dL (ref 0.2–1.2)

## 2013-07-02 LAB — INSULIN, FASTING: Insulin fasting, serum: 7 u[IU]/mL (ref 3–28)

## 2013-07-02 LAB — BASIC METABOLIC PANEL WITH GFR
BUN: 14 mg/dL (ref 6–23)
CALCIUM: 9.5 mg/dL (ref 8.4–10.5)
CO2: 27 meq/L (ref 19–32)
CREATININE: 0.9 mg/dL (ref 0.50–1.35)
Chloride: 101 mEq/L (ref 96–112)
GFR, Est Non African American: 85 mL/min
GLUCOSE: 79 mg/dL (ref 70–99)
Potassium: 4.7 mEq/L (ref 3.5–5.3)
Sodium: 138 mEq/L (ref 135–145)

## 2013-07-02 LAB — MAGNESIUM: Magnesium: 2 mg/dL (ref 1.5–2.5)

## 2013-07-02 LAB — VITAMIN D 25 HYDROXY (VIT D DEFICIENCY, FRACTURES): Vit D, 25-Hydroxy: 87 ng/mL (ref 30–89)

## 2013-07-02 LAB — TSH: TSH: 0.768 u[IU]/mL (ref 0.350–4.500)

## 2013-07-05 ENCOUNTER — Other Ambulatory Visit: Payer: Self-pay | Admitting: *Deleted

## 2013-07-05 MED ORDER — ATORVASTATIN CALCIUM 80 MG PO TABS: 80.0000 mg | ORAL_TABLET | Freq: Every day | ORAL | Status: DC

## 2013-09-29 ENCOUNTER — Telehealth: Payer: Self-pay | Admitting: *Deleted

## 2013-09-29 NOTE — Telephone Encounter (Signed)
Spouse called and states patient is fatigued and feels depressed due to his memory loss.  She asked if taking a B12 injection would help his fatigue.  Per Dr Oneta Rack, last lab work did not indicate he needed B12 and would not help his fatigue.

## 2013-10-06 ENCOUNTER — Ambulatory Visit (INDEPENDENT_AMBULATORY_CARE_PROVIDER_SITE_OTHER): Payer: Commercial Managed Care - HMO | Admitting: Internal Medicine

## 2013-10-06 ENCOUNTER — Encounter: Payer: Self-pay | Admitting: Internal Medicine

## 2013-10-06 VITALS — BP 122/80 | HR 76 | Temp 99.6°F | Resp 16 | Ht 68.75 in | Wt 164.6 lb

## 2013-10-06 DIAGNOSIS — R7309 Other abnormal glucose: Secondary | ICD-10-CM

## 2013-10-06 DIAGNOSIS — E782 Mixed hyperlipidemia: Secondary | ICD-10-CM

## 2013-10-06 DIAGNOSIS — G301 Alzheimer's disease with late onset: Secondary | ICD-10-CM

## 2013-10-06 DIAGNOSIS — Z79899 Other long term (current) drug therapy: Secondary | ICD-10-CM

## 2013-10-06 DIAGNOSIS — F0280 Dementia in other diseases classified elsewhere without behavioral disturbance: Secondary | ICD-10-CM

## 2013-10-06 DIAGNOSIS — I10 Essential (primary) hypertension: Secondary | ICD-10-CM

## 2013-10-06 DIAGNOSIS — G309 Alzheimer's disease, unspecified: Secondary | ICD-10-CM

## 2013-10-06 DIAGNOSIS — E559 Vitamin D deficiency, unspecified: Secondary | ICD-10-CM

## 2013-10-06 DIAGNOSIS — E538 Deficiency of other specified B group vitamins: Secondary | ICD-10-CM

## 2013-10-06 NOTE — Progress Notes (Signed)
Patient ID: Cameron Mcclain, male   DOB: 03-Oct-1940, 73 y.o.   MRN: [id number]   This very nice 73 y.o.MWM presents for 3 month follow up with Hypertension, Hyperlipidemia, Pre-Diabetes, SDAT and Vitamin D Deficiency. Patient feels his memory loss is getting worse having more difficulty recalling Short Term facts and events and consequently has become very frustrated. Pt's wife is reminded that she stopped his Lipitor for concern that it was what caused his memory loss and likewise stopped his Aricept for reason of suspected dizziness.   Patient is treated for HTN & BP has been controlled and today's BP: 122/80 mmHg. Patient denies any cardiac type chest pain, palpitations, dyspnea/orthopnea/PND, dizziness, claudication, or dependent edema.   Hyperlipidemia is not controlled or at goal with diet & no meds. Last Lipids were  Chol 242*; HDL 53; LDL  162*; Trig 134 on 07/01/2013.   Also, the patient is screened for type 3 or PreDiabetes and patient denies any symptoms of reactive hypoglycemia, diabetic polys, paresthesias or visual blurring.  Last A1c was 5.4% on  07/01/2013.    Further, Patient has history of Vitamin D Deficiency and patient supplements vitamin D without any suspected side-effects. Last vitamin D was  87 on 07/01/2013.   Medication List   atorvastatin 80 MG tablet  Commonly known as:  LIPITOR  Take 1 tablet (80 mg total) by mouth daily.     CALCIUM MAGNESIUM PO  Take 1 tablet by mouth daily.     co-enzyme Q-10 30 MG capsule  Take 1 capsule (30 mg total) by mouth daily.     Fish Oil 1000 MG Caps  Take 1 capsule (1,000 mg total) by mouth 2 (two) times daily.     fluticasone-salmeterol 115-21 MCG/ACT inhaler  Commonly known as:  ADVAIR HFA  Inhale 2 puffs into the lungs 2 (two) times daily. Rinse mouth after each use     latanoprost 0.005 % ophthalmic solution  Commonly known as:  XALATAN  Place 1 drop into both eyes at bedtime.     SUPER B COMPLEX PO  Take by mouth daily.      Vitamin D 2000 UNITS tablet  Take 1 tablet (2,000 Units total) by mouth daily.     Allergies  Allergen Reactions  . Benadryl [Diphenhydramine Hcl] Other (See Comments)    Hallucinations, itching, shaking   PMHx:   Past Medical History  Diagnosis Date  . Hyperlipidemia   . Hypertension   . Pre-diabetes   . Vitamin D deficiency   . OSA (obstructive sleep apnea)   . RLS (restless legs syndrome)   . Glaucoma    FHx:    Reviewed / unchanged SHx:    Reviewed / unchanged  Systems Review:  Constitutional: Denies fever, chills, wt changes, headaches, insomnia, fatigue, night sweats, change in appetite. Eyes: Denies redness, blurred vision, diplopia, discharge, itchy, watery eyes.  ENT: Denies discharge, congestion, post nasal drip, epistaxis, sore throat, earache, hearing loss, dental pain, tinnitus, vertigo, sinus pain, snoring.  CV: Denies chest pain, palpitations, irregular heartbeat, syncope, dyspnea, diaphoresis, orthopnea, PND, claudication or edema. Respiratory: denies cough, dyspnea, DOE, pleurisy, hoarseness, laryngitis, wheezing.  Gastrointestinal: Denies dysphagia, odynophagia, heartburn, reflux, water brash, abdominal pain or cramps, nausea, vomiting, bloating, diarrhea, constipation, hematemesis, melena, hematochezia  or hemorrhoids. Genitourinary: Denies dysuria, frequency, urgency, nocturia, hesitancy, discharge, hematuria or flank pain. Musculoskeletal: Denies arthralgias, myalgias, stiffness, jt. swelling, pain, limping or strain/sprain.  Skin: Denies pruritus, rash, hives, warts, acne, eczema or change in skin  lesion(s). Neuro: No weakness, tremor, incoordination, spasms, paresthesia or pain. Psychiatric: Denies confusion, memory loss or sensory loss. Endo: Denies change in weight, skin or hair change.  Heme/Lymph: No excessive bleeding, bruising or enlarged lymph nodes.  Exam:  BP 122/80  Pulse 76  Temp(Src) 99.6 F (37.6 C) (Temporal)  Resp 16  Ht 5'  8.75" (1.746 m)  Wt 164 lb 9.6 oz (74.662 kg)  BMI 24.49 kg/m2  Appears well nourished and in no distress. Eyes: PERRLA, EOMs, conjunctiva no swelling or erythema. Sinuses: No frontal/maxillary tenderness ENT/Mouth: EAC's clear, TM's nl w/o erythema, bulging. Nares clear w/o erythema, swelling, exudates. Oropharynx clear without erythema or exudates. Oral hygiene is good. Tongue normal, non obstructing. Hearing intact.  Neck: Supple. Thyroid nl. Car 2+/2+ without bruits, nodes or JVD. Chest: Respirations nl with BS clear & equal w/o rales, rhonchi, wheezing or stridor.  Cor: Heart sounds normal w/ regular rate and rhythm without sig. murmurs, gallops, clicks, or rubs. Peripheral pulses normal and equal  without edema.  Abdomen: Soft & bowel sounds normal. Non-tender w/o guarding, rebound, hernias, masses, or organomegaly.  Lymphatics: Unremarkable.  Musculoskeletal: Full ROM all peripheral extremities, joint stability, 5/5 strength, and normal gait.  Skin: Warm, dry without exposed rashes, lesions or ecchymosis apparent.  Neuro: Cranial nerves intact, reflexes equal bilaterally. Sensory-motor testing grossly intact. Tendon reflexes grossly intact.  Pysch: Alert & oriented x 3.  Insight and judgement lumited.   Assessment and Plan:  1. Hypertension - Continue monitor blood pressure at home. Continue diet/meds same.  2. Hyperlipidemia - Continue diet, exercise,& lifestyle modifications. Continue monitor periodic cholesterol/liver & renal functions. Consider alternate Med therapy pending labs.  3. Pre-Diabetes, Screening - Continue diet, exercise, lifestyle modifications. Monitor appropriate labs.  4. Vitamin D Deficiency - Continue supplementation.  5. SDAT - Refer back fo Dr Richardean Chimera for consideration of alternate meds. Explained to wife that meds may slow but not prevent the in evidable mental decline  Recommended regular exercise, BP monitoring, weight control, and discussed med and  SE's. Recommended labs to assess and monitor clinical status. Further disposition pending results of labs.

## 2013-10-06 NOTE — Patient Instructions (Addendum)
Recommend the book "The END of DIETING" by Dr Baker Janus   and the book "The END of DIABETES " by Dr Excell Seltzer  At [email or web address] - get book & Audio CD's      Being diabetic has a  300% increased risk for heart attack, stroke, cancer, and alzheimer- type vascular dementia. It is very important that you work harder with diet by avoiding all foods that are white except chicken & fish. Avoid white rice (brown & wild rice is OK), white potatoes (sweetpotatoes in moderation is OK), White bread or wheat bread or anything made out of white flour like bagels, donuts, rolls, buns, biscuits, cakes, pastries, cookies, pizza crust, and pasta (made from white flour & egg whites) - vegetarian pasta or spinach or wheat pasta is OK. Multigrain breads like Arnold's or Pepperidge [employer], or multigrain sandwich thins or flatbreads.  Diet, exercise and weight loss can reverse and cure diabetes in the early stages.  Diet, exercise and weight loss is very important in the control and prevention of complications of diabetes which affects every system in your body, ie. Brain - dementia/stroke, eyes - glaucoma/blindness, heart - heart attack/heart failure, kidneys - dialysis, stomach - gastric paralysis, intestines - malabsorption, nerves - severe painful neuritis, circulation - gangrene & loss of a leg(s), and finally cancer and Alzheimers.    I recommend avoid fried & greasy foods,  sweets/candy, white rice (brown or wild rice or Quinoa is OK), white potatoes (sweet potatoes are OK) - anything made from white flour - bagels, doughnuts, rolls, buns, biscuits,white and wheat breads, pizza crust and traditional pasta made of white flour & egg white(vegetarian pasta or spinach or wheat pasta is OK).  Multi-grain bread is OK - like multi-grain flat bread or sandwich thins. Avoid alcohol in excess. Exercise is also important.    Eat all the vegetables you want - avoid meat, especially red meat and dairy - especially cheese.  Cheese  is the most concentrated form of trans-fats which is the worst thing to clog up our arteries. Veggie cheese is OK which can be found in the fresh produce section at [employer] or [employer] or [employer]  Alzheimer Disease Alzheimer disease is a mental disorder. It causes memory loss and loss of other mental functions, such as learning, thinking, problem solving, communicating, and completing tasks. The mental losses interfere with the ability to perform daily activities at work, at home, or in social situations. Alzheimer disease usually starts in a person's late 10s or early 68s but can start earlier in life (familial form). The mental changes caused by this disease are permanent and worsen over time. As the illness progresses, the ability to do even the simplest things is lost. Survival with Alzheimer disease ranges from several years to as long as 20 years. CAUSES Alzheimer disease is caused by abnormally high levels of a protein (beta-amyloid) in the brain. This protein forms very small deposits within and around the brain's nerve cells. These deposits prevent the nerve cells from working properly. Experts are not certain what causes the beta-amyloid deposits in this disease. RISK FACTORS The following major risk factors have been identified:  Increasing age.  Certain genetic variations, such as Down syndrome (trisomy 21). SYMPTOMS In the early stages of Alzheimer disease, you are still able to perform daily activities but need greater effort, more time, or memory aids. Early symptoms include:  Mild memory loss of recent events, names, or phone numbers.  Loss of objects.  Minor loss of vocabulary.  Difficulty with complex tasks, such as paying bills or driving in unfamiliar locations. Other mental functions deteriorate as the disease worsens. These changes slowly go from mild to severe. Symptoms at this stage include:  Difficulty remembering. You may not be able to recall personal  information such as your address and telephone number. You may become confused about the date, the season of the year, or your location.  Difficulty maintaining attention. You may forget what you wanted to say during conversations and repeat what you have already said.  Difficulty learning new information or tasks. You may not remember what you read or the name of a new friend you met.  Difficulty counting or doing math. You may have difficulty with complex math problems. You may make mistakes in paying bills or managing your checkbook.  Poor reasoning and judgment. You may make poor decisions or not dress right for the weather.  Difficulty communicating. You may have regular difficulty remembering words, naming objects, expressing yourself clearly, or writing sentences that make sense.  Difficulty performing familiar daily activities. You may get lost driving in familiar locations or need help eating, bathing, dressing, grooming, or using the toilet. You may have difficulty maintaining bladder or bowel control.  Difficulty recognizing familiar faces. You may confuse family members or close friends with one another. You may not recognize a close relative or may mistake strangers for family. Alzheimer disease also may cause changes in personality and behavior. These changes include:   Loss of interest or motivation.  Social withdrawal.  Anxiety.  Difficulty sleeping.  Uncharacteristic anger or combativeness.  A false belief that someone is trying to harm you (paranoia).  Seeing things that are not real (hallucinations).  Agitation. Confusion and disruptive behavior are often worse at night and may be triggered by changes in the environment or acute medical issues. DIAGNOSIS  Alzheimer disease is diagnosed through an assessment by your health care provider. During this assessment, your health care provider will do the following:  Ask you and your family, friends, or caregivers  questions about your symptoms, their frequency, their duration and progression, and the effect they are having on your life.  Ask questions about your personal and family medical history and use of alcohol or drugs, including prescription medicine.  Perform a physical exam and order blood tests and brain imaging exams. Your health care provider may refer you to a specialist for detailed evaluation of your mental functions (neuropsychological testing).  Many different brain disorders, medical conditions, and certain substances can cause symptoms that resemble Alzheimer disease symptoms. These must be ruled out before this disease can be diagnosed. If Alzheimer disease is diagnosed, it will be considered either "possible" or "probable" Alzheimer disease. "Possible" Alzheimer disease means that your symptoms are typical of the disease and no other disorder is causing them. "Probable" Alzheimer disease means that you also have a family history of the disease or genetic test results that support the diagnosis. Certain tests, mostly used in research studies, are highly specific for Alzheimer disease.  TREATMENT  There is currently no cure for this disease. The goals of treatment are to:  Slow down the progression of the disease.  Preserve mental function as long as possible.  Manage behavioral symptoms.  Make life easier for the person with Alzheimer disease and his or her caregivers. The following treatment options are available:  Medicine. Certain medicines may help slow memory loss by changing the level of certain chemicals in the  brain. Medicine may also help with behavioral symptoms.  Talk therapy. Talk therapy provides education, support, and memory aids for people with this disease. It is most effective in the early stages of the illness.  Caregiving. Caregivers may be family members, friends, or trained medical professionals. They help the person with Alzheimer disease with daily life  activities. Caregiving may take place at home or at a nursing facility.  Family support groups. These provide education, emotional support, and information about community resources to family members who are taking care of the person with this disease. Document Released: 10/03/2003 Document Revised: 06/07/2013 Document Reviewed: 05/29/2012 [facility] Patient Information 2015 [place], [place]. This information is not intended to replace advice given to you by your health care provider. Make sure you discuss any questions you have with your health care provider.

## 2013-10-07 LAB — CBC WITH DIFFERENTIAL/PLATELET
BASOS ABS: 0.1 10*3/uL (ref 0.0–0.1)
Basophils Relative: 1 % (ref 0–1)
EOS ABS: 0.2 10*3/uL (ref 0.0–0.7)
EOS PCT: 2 % (ref 0–5)
HCT: 44.8 % (ref 39.0–52.0)
Hemoglobin: 15.6 g/dL (ref 13.0–17.0)
LYMPHS ABS: 3.3 10*3/uL (ref 0.7–4.0)
Lymphocytes Relative: 40 % (ref 12–46)
MCH: 32.6 pg (ref 26.0–34.0)
MCHC: 34.8 g/dL (ref 30.0–36.0)
MCV: 93.5 fL (ref 78.0–100.0)
Monocytes Absolute: 0.6 10*3/uL (ref 0.1–1.0)
Monocytes Relative: 7 % (ref 3–12)
Neutro Abs: 4.1 10*3/uL (ref 1.7–7.7)
Neutrophils Relative %: 50 % (ref 43–77)
Platelets: 237 10*3/uL (ref 150–400)
RBC: 4.79 MIL/uL (ref 4.22–5.81)
RDW: 13 % (ref 11.5–15.5)
WBC: 8.2 10*3/uL (ref 4.0–10.5)

## 2013-10-07 LAB — BASIC METABOLIC PANEL WITH GFR
BUN: 15 mg/dL (ref 6–23)
CALCIUM: 9.6 mg/dL (ref 8.4–10.5)
CO2: 29 meq/L (ref 19–32)
CREATININE: 0.85 mg/dL (ref 0.50–1.35)
Chloride: 99 mEq/L (ref 96–112)
GFR, Est African American: >89 mL/min
GFR, Est Non African American: 87 mL/min
Glucose, Bld: 117 mg/dL — ABNORMAL HIGH (ref 70–99)
Potassium: 4 mEq/L (ref 3.5–5.3)
Sodium: 136 mEq/L (ref 135–145)

## 2013-10-07 LAB — TSH: TSH: 0.84 u[IU]/mL (ref 0.350–4.500)

## 2013-10-07 LAB — VITAMIN B12: Vitamin B-12: 689 pg/mL (ref 211–911)

## 2013-10-07 LAB — HEPATIC FUNCTION PANEL
ALBUMIN: 4.6 g/dL (ref 3.5–5.2)
ALT: 13 U/L (ref 0–53)
AST: 19 U/L (ref 0–37)
Alkaline Phosphatase: 53 U/L (ref 39–117)
BILIRUBIN TOTAL: 0.7 mg/dL (ref 0.2–1.2)
Bilirubin, Direct: 0.1 mg/dL (ref 0.0–0.3)
Indirect Bilirubin: 0.6 mg/dL (ref 0.2–1.2)
Total Protein: 6.9 g/dL (ref 6.0–8.3)

## 2013-10-07 LAB — VITAMIN D 25 HYDROXY (VIT D DEFICIENCY, FRACTURES): Vit D, 25-Hydroxy: 98 ng/mL — ABNORMAL HIGH (ref 30–89)

## 2013-10-07 LAB — HEMOGLOBIN A1C
Hgb A1c MFr Bld: 5.5 % (ref <5.7)
MEAN PLASMA GLUCOSE: 111 mg/dL (ref <117)

## 2013-10-07 LAB — LIPID PANEL
Cholesterol: 252 mg/dL — ABNORMAL HIGH (ref 0–200)
HDL: 52 mg/dL (ref >39)
LDL Cholesterol: 153 mg/dL — ABNORMAL HIGH (ref 0–99)
TRIGLYCERIDES: 235 mg/dL — AB (ref <150)
Total CHOL/HDL Ratio: 4.8 Ratio
VLDL: 47 mg/dL — AB (ref 0–40)

## 2013-10-07 LAB — INSULIN, FASTING: INSULIN FASTING, SERUM: 29.4 u[IU]/mL — AB (ref 2.0–19.6)

## 2013-10-07 LAB — MAGNESIUM: Magnesium: 2 mg/dL (ref 1.5–2.5)

## 2013-10-09 LAB — METHYLMALONIC ACID, SERUM: METHYLMALONIC ACID, QUANT: 130 nmol/L (ref 87–318)

## 2013-11-23 ENCOUNTER — Ambulatory Visit (INDEPENDENT_AMBULATORY_CARE_PROVIDER_SITE_OTHER): Payer: Commercial Managed Care - HMO

## 2013-11-23 VITALS — Temp 98.6°F

## 2013-11-23 DIAGNOSIS — Z23 Encounter for immunization: Secondary | ICD-10-CM

## 2013-12-23 ENCOUNTER — Other Ambulatory Visit: Payer: Self-pay

## 2013-12-23 MED ORDER — IPRATROPIUM BROMIDE HFA 17 MCG/ACT IN AERS: 2.0000 | INHALATION_SPRAY | RESPIRATORY_TRACT | Status: DC | PRN

## 2013-12-24 ENCOUNTER — Other Ambulatory Visit: Payer: Self-pay | Admitting: Internal Medicine

## 2014-01-05 ENCOUNTER — Encounter: Payer: Self-pay | Admitting: Internal Medicine

## 2014-01-05 ENCOUNTER — Ambulatory Visit (INDEPENDENT_AMBULATORY_CARE_PROVIDER_SITE_OTHER): Payer: Commercial Managed Care - HMO | Admitting: Internal Medicine

## 2014-01-05 VITALS — BP 128/80 | HR 72 | Temp 98.2°F | Resp 16 | Ht 68.75 in | Wt 163.0 lb

## 2014-01-05 DIAGNOSIS — I10 Essential (primary) hypertension: Secondary | ICD-10-CM

## 2014-01-05 DIAGNOSIS — E559 Vitamin D deficiency, unspecified: Secondary | ICD-10-CM

## 2014-01-05 DIAGNOSIS — R7303 Prediabetes: Secondary | ICD-10-CM

## 2014-01-05 DIAGNOSIS — Z79899 Other long term (current) drug therapy: Secondary | ICD-10-CM

## 2014-01-05 DIAGNOSIS — R7309 Other abnormal glucose: Secondary | ICD-10-CM

## 2014-01-05 DIAGNOSIS — E782 Mixed hyperlipidemia: Secondary | ICD-10-CM

## 2014-01-05 NOTE — Progress Notes (Signed)
Patient ID: Cameron DienesRobert V Mcclain, male   DOB: April 28, 1940, 73 y.o.   MRN: [id number]   This very nice 73 y.o.male presents for 3 month follow up with Hypertension, Hyperlipidemia, Pre-Diabetes and Vitamin D Deficiency.    Patient has SDAT and has been evaluated in the past by Dr Richardean Chimeraohmeir. Patient is ambulatory, continent and attends to his own personal hygiene needs. He has essentially forgone his favorite pastime of reading due to his impaired ST recall and limited comprehension.   Patient is treated for HTN & BP has been controlled at home. Today's BP: 128/80 mmHg. Patient has had no complaints of any cardiac type chest pain, palpitations, dyspnea/orthopnea/PND, dizziness, claudication, or dependent edema.   Hyperlipidemia is controlled with diet & meds. Patient denies myalgias or other med SE's. Last Lipids were not at goal Total Chol  252*; HDL  52; LDL  153*; Trig 235 on 10/06/2013. Note wife refuses to allow patient to take statins as she is under the impression that Statins cause dementia.   Also, the patient has history of PreDiabetes and has had no symptoms of reactive hypoglycemia, diabetic polys, paresthesias or visual blurring.  Last A1c was 5.5% on  10/06/2013.    Further, the patient also has history of Vitamin D Deficiency and supplements vitamin D without any suspected side-effects. Last vitamin D was  98 on  10/06/2013. Medication Sig  . Apoaequorin (PREVAGEN PO) Take by mouth daily.  . SUPER B COMPLEX  Take by mouth daily.  Marland Kitchen. VITAMIN D 2000 UNITS tablet Take 1 tablet (2,000 Units total) by mouth daily.  Marland Kitchen. latanoprost (XALATAN) 0.005 % ophth soln Place 1 drop into both eyes at bedtime.   Marland Kitchen. FISH OIL 1000 MG CAPS Take 1 capsule (1,000 mg total) by mouth 2 (two) times daily.  Marland Kitchen. atorvastatin   OFF Atorvastatin per wife's refusal to allow him to take  . Calcium-Magnesium-Vitamin D Take 1 tablet by mouth daily.   Allergies  Allergen Reactions  . Benadryl [Diphenhydramine Hcl] Other (See Comments)     Hallucinations, itching, shaking   PMHx:   Past Medical History  Diagnosis Date  . Hyperlipidemia   . Hypertension   . Pre-diabetes   . Vitamin D deficiency   . OSA (obstructive sleep apnea)   . RLS (restless legs syndrome)   . Glaucoma    Immunization History  Administered Date(s) Administered  . Influenza Whole 11/23/2012  . Influenza, High Dose Seasonal PF 11/23/2013  . Pneumococcal Polysaccharide-23 12/02/2008  . Td 09/26/2010  . Zoster 02/25/2008   Past Surgical History  Procedure Laterality Date  . Pilonidal cyst / sinus excision  1959  . Axillary Right 1986    biopsy of axillary nodes   FHx:    Reviewed / unchanged  SHx:    Reviewed / unchanged  Systems Review:  Constitutional: Denies fever, chills, wt changes, headaches, insomnia, fatigue, night sweats, change in appetite. Eyes: Denies redness, blurred vision, diplopia, discharge, itchy, watery eyes.  ENT: Denies discharge, congestion, post nasal drip, epistaxis, sore throat, earache, hearing loss, dental pain, tinnitus, vertigo, sinus pain, snoring.  CV: Denies chest pain, palpitations, irregular heartbeat, syncope, dyspnea, diaphoresis, orthopnea, PND, claudication or edema. Respiratory: denies cough, dyspnea, DOE, pleurisy, hoarseness, laryngitis, wheezing.  Gastrointestinal: Denies dysphagia, odynophagia, heartburn, reflux, water brash, abdominal pain or cramps, nausea, vomiting, bloating, diarrhea, constipation, hematemesis, melena, hematochezia  or hemorrhoids. Genitourinary: Denies dysuria, frequency, urgency, nocturia, hesitancy, discharge, hematuria or flank pain. Musculoskeletal: Denies arthralgias, myalgias, stiffness, jt. swelling, pain,  limping or strain/sprain.  Skin: Denies pruritus, rash, hives, warts, acne, eczema or change in skin lesion(s). Neuro: No weakness, tremor, incoordination, spasms, paresthesia or pain. Psychiatric: Denies confusion, memory loss or sensory loss. Endo: Denies change in  weight, skin or hair change.  Heme/Lymph: No excessive bleeding, bruising or enlarged lymph nodes.  Physical Exam  BP 128/80  Pulse 72  Temp 98.2 F  Resp 16  Ht 5' 8.75"   Wt 163 lb   BMI 24.25   Appears well nourished and in no distress. Eyes: PERRLA, EOMs, conjunctiva no swelling or erythema. Sinuses: No frontal/maxillary tenderness ENT/Mouth: EAC's clear, TM's nl w/o erythema, bulging. Nares clear w/o erythema, swelling, exudates. Oropharynx clear without erythema or exudates. Oral hygiene is good. Tongue normal, non obstructing. Hearing intact.  Neck: Supple. Thyroid nl. Car 2+/2+ without bruits, nodes or JVD. Chest: Respirations nl with BS clear & equal w/o rales, rhonchi, wheezing or stridor.  Cor: Heart sounds normal w/ regular rate and rhythm without sig. murmurs, gallops, clicks, or rubs. Peripheral pulses normal and equal  without edema.  Abdomen: Soft & bowel sounds normal. Non-tender w/o guarding, rebound, hernias, masses, or organomegaly.  Lymphatics: Unremarkable.  Musculoskeletal: Full ROM all peripheral extremities, joint stability, 5/5 strength, and normal gait.  Skin: Warm, dry without exposed rashes, lesions or ecchymosis apparent.  Neuro: Cranial nerves intact, reflexes equal bilaterally. Sensory-motor testing grossly intact. Tendon reflexes grossly intact.  Pysch: Alert & oriented x 3. Pleasant agreeable affect. Short term memory recall is very poor.  Assessment and Plan:  1. Hypertension - Continue monitor blood pressure at home. Continue diet/meds same.  2. Hyperlipidemia - Continue diet/meds, exercise,& lifestyle modifications. Continue monitor periodic cholesterol/liver & renal functions   3. Pre-Diabetes - Continue diet, exercise, lifestyle modifications. Monitor appropriate labs.  4. Vitamin D Deficiency - Continue supplementation.  5. SDAT -    Recommended regular exercise, BP monitoring, weight control, and discussed med and SE's. Recommended labs  to assess and monitor clinical status. Further disposition pending results of labs.

## 2014-01-05 NOTE — Patient Instructions (Signed)

## 2014-01-06 LAB — CBC WITH DIFFERENTIAL/PLATELET
Basophils Absolute: 0.1 10*3/uL (ref 0.0–0.1)
Basophils Relative: 1 % (ref 0–1)
Eosinophils Absolute: 0.1 10*3/uL (ref 0.0–0.7)
Eosinophils Relative: 1 % (ref 0–5)
HEMATOCRIT: 44.5 % (ref 39.0–52.0)
HEMOGLOBIN: 15.9 g/dL (ref 13.0–17.0)
Lymphocytes Relative: 28 % (ref 12–46)
Lymphs Abs: 2.4 10*3/uL (ref 0.7–4.0)
MCH: 32.9 pg (ref 26.0–34.0)
MCHC: 35.7 g/dL (ref 30.0–36.0)
MCV: 92.1 fL (ref 78.0–100.0)
MONOS PCT: 5 % (ref 3–12)
MPV: 10.1 fL (ref 9.4–12.4)
Monocytes Absolute: 0.4 10*3/uL (ref 0.1–1.0)
NEUTROS ABS: 5.7 10*3/uL (ref 1.7–7.7)
NEUTROS PCT: 65 % (ref 43–77)
Platelets: 264 10*3/uL (ref 150–400)
RBC: 4.83 MIL/uL (ref 4.22–5.81)
RDW: 12.5 % (ref 11.5–15.5)
WBC: 8.7 10*3/uL (ref 4.0–10.5)

## 2014-01-06 LAB — VITAMIN D 25 HYDROXY (VIT D DEFICIENCY, FRACTURES): Vit D, 25-Hydroxy: 61 ng/mL (ref 30–100)

## 2014-01-06 LAB — HEPATIC FUNCTION PANEL
ALT: 12 U/L (ref 0–53)
AST: 19 U/L (ref 0–37)
Albumin: 4.1 g/dL (ref 3.5–5.2)
Alkaline Phosphatase: 45 U/L (ref 39–117)
BILIRUBIN DIRECT: 0.1 mg/dL (ref 0.0–0.3)
BILIRUBIN INDIRECT: 0.7 mg/dL (ref 0.2–1.2)
Total Bilirubin: 0.8 mg/dL (ref 0.2–1.2)
Total Protein: 6.5 g/dL (ref 6.0–8.3)

## 2014-01-06 LAB — HEMOGLOBIN A1C
Hgb A1c MFr Bld: 5.5 % (ref <5.7)
Mean Plasma Glucose: 111 mg/dL (ref <117)

## 2014-01-06 LAB — BASIC METABOLIC PANEL WITH GFR
BUN: 10 mg/dL (ref 6–23)
CO2: 25 meq/L (ref 19–32)
Calcium: 9.3 mg/dL (ref 8.4–10.5)
Chloride: 99 mEq/L (ref 96–112)
Creat: 0.82 mg/dL (ref 0.50–1.35)
GFR, Est Non African American: 88 mL/min
GLUCOSE: 91 mg/dL (ref 70–99)
POTASSIUM: 4.3 meq/L (ref 3.5–5.3)
SODIUM: 135 meq/L (ref 135–145)

## 2014-01-06 LAB — LIPID PANEL
CHOL/HDL RATIO: 5.1 ratio
CHOLESTEROL: 228 mg/dL — AB (ref 0–200)
HDL: 45 mg/dL (ref >39)
LDL Cholesterol: 152 mg/dL — ABNORMAL HIGH (ref 0–99)
Triglycerides: 154 mg/dL — ABNORMAL HIGH (ref <150)
VLDL: 31 mg/dL (ref 0–40)

## 2014-01-06 LAB — MAGNESIUM: Magnesium: 1.8 mg/dL (ref 1.5–2.5)

## 2014-01-06 LAB — TSH: TSH: 0.802 u[IU]/mL (ref 0.350–4.500)

## 2014-01-06 LAB — INSULIN, FASTING: Insulin fasting, serum: 3.3 u[IU]/mL (ref 2.0–19.6)

## 2014-01-09 ENCOUNTER — Other Ambulatory Visit: Payer: Self-pay | Admitting: Internal Medicine

## 2014-01-09 MED ORDER — EZETIMIBE 10 MG PO TABS: ORAL_TABLET | ORAL | Status: DC

## 2014-01-12 ENCOUNTER — Ambulatory Visit (INDEPENDENT_AMBULATORY_CARE_PROVIDER_SITE_OTHER): Payer: Commercial Managed Care - HMO | Admitting: *Deleted

## 2014-01-12 DIAGNOSIS — Z23 Encounter for immunization: Secondary | ICD-10-CM

## 2014-04-07 ENCOUNTER — Encounter: Payer: Self-pay | Admitting: Internal Medicine

## 2014-04-07 ENCOUNTER — Ambulatory Visit (INDEPENDENT_AMBULATORY_CARE_PROVIDER_SITE_OTHER): Payer: Commercial Managed Care - HMO | Admitting: Internal Medicine

## 2014-04-07 VITALS — BP 126/82 | HR 68 | Temp 97.5°F | Resp 16 | Ht 68.5 in | Wt 166.2 lb

## 2014-04-07 DIAGNOSIS — I10 Essential (primary) hypertension: Secondary | ICD-10-CM

## 2014-04-07 DIAGNOSIS — G301 Alzheimer's disease with late onset: Secondary | ICD-10-CM

## 2014-04-07 DIAGNOSIS — E559 Vitamin D deficiency, unspecified: Secondary | ICD-10-CM

## 2014-04-07 DIAGNOSIS — F0280 Dementia in other diseases classified elsewhere without behavioral disturbance: Secondary | ICD-10-CM

## 2014-04-07 DIAGNOSIS — E538 Deficiency of other specified B group vitamins: Secondary | ICD-10-CM

## 2014-04-07 DIAGNOSIS — E349 Endocrine disorder, unspecified: Secondary | ICD-10-CM

## 2014-04-07 DIAGNOSIS — R5383 Other fatigue: Secondary | ICD-10-CM

## 2014-04-07 DIAGNOSIS — R7309 Other abnormal glucose: Secondary | ICD-10-CM

## 2014-04-07 DIAGNOSIS — Z1212 Encounter for screening for malignant neoplasm of rectum: Secondary | ICD-10-CM

## 2014-04-07 DIAGNOSIS — Z1331 Encounter for screening for depression: Secondary | ICD-10-CM

## 2014-04-07 DIAGNOSIS — Z9181 History of falling: Secondary | ICD-10-CM

## 2014-04-07 DIAGNOSIS — R7303 Prediabetes: Secondary | ICD-10-CM

## 2014-04-07 DIAGNOSIS — E782 Mixed hyperlipidemia: Secondary | ICD-10-CM

## 2014-04-07 DIAGNOSIS — Z125 Encounter for screening for malignant neoplasm of prostate: Secondary | ICD-10-CM

## 2014-04-07 DIAGNOSIS — Z79899 Other long term (current) drug therapy: Secondary | ICD-10-CM

## 2014-04-07 LAB — HEMOGLOBIN A1C
Hgb A1c MFr Bld: 5.6 % (ref <5.7)
MEAN PLASMA GLUCOSE: 114 mg/dL (ref <117)

## 2014-04-07 LAB — CBC WITH DIFFERENTIAL/PLATELET
Basophils Absolute: 0.1 10*3/uL (ref 0.0–0.1)
Basophils Relative: 1 % (ref 0–1)
EOS ABS: 0.3 10*3/uL (ref 0.0–0.7)
Eosinophils Relative: 3 % (ref 0–5)
HEMATOCRIT: 47.7 % (ref 39.0–52.0)
HEMOGLOBIN: 16 g/dL (ref 13.0–17.0)
LYMPHS PCT: 41 % (ref 12–46)
Lymphs Abs: 3.6 10*3/uL (ref 0.7–4.0)
MCH: 32.3 pg (ref 26.0–34.0)
MCHC: 33.5 g/dL (ref 30.0–36.0)
MCV: 96.4 fL (ref 78.0–100.0)
MPV: 10.6 fL (ref 8.6–12.4)
Monocytes Absolute: 0.7 10*3/uL (ref 0.1–1.0)
Monocytes Relative: 8 % (ref 3–12)
Neutro Abs: 4.1 10*3/uL (ref 1.7–7.7)
Neutrophils Relative %: 47 % (ref 43–77)
Platelets: 246 10*3/uL (ref 150–400)
RBC: 4.95 MIL/uL (ref 4.22–5.81)
RDW: 12.6 % (ref 11.5–15.5)
WBC: 8.8 10*3/uL (ref 4.0–10.5)

## 2014-04-07 MED ORDER — MEMANTINE HCL ER 7 & 14 & 21 &28 MG PO CP24: ORAL_CAPSULE | ORAL | Status: DC

## 2014-04-07 NOTE — Progress Notes (Signed)
Patient ID: Cameron Mcclain, male   DOB: 05-25-1940, 74 y.o.   MRN: [id number]  Annual Comprehensive Examination  This very nice 74 y.o. MWM presents for complete physical.  Patient has been followed for HTN,  Prediabetes, Hyperlipidemia,mild SDAT and Vitamin D Deficiency.   HTN predates since 1998. Patient's BP has been controlled at home.Today's BP: 126/82 mmHg. Patient denies any cardiac symptoms as chest pain, palpitations, shortness of breath, dizziness or ankle swelling.   Patient's hyperlipidemia is controlled with diet and medications. Patient denies myalgias or other medication SE's.  Patient is intolerant and last lipids were not at goal - Total Cholesterol,  228; HDL 45; LDL  152; Trig 154 on 01/05/2014.   Patient has  Is screened for prediabetes and patient denies reactive hypoglycemic symptoms, visual blurring, diabetic polys or paresthesias. Last A1c was  5.5% on  01/05/2014.   Patient has mild SDAT and has seen Dr Richardean Chimera in the past. He is intolerant to Aricept. Finally, patient has history of Vitamin D Deficiency of 24 in 2008 and last vitamin D was 61 on 01/05/2014.  Medication Sig  . Apoaequorin (PREVAGEN PO) Take by mouth daily.  . SUPER B COMPLEX  Take by mouth daily.  . Calcium-Magnesium-Vitamin D  Take 1 tablet by mouth daily.  Marland Kitchen VITAMIN D 2000 UNITS tablet Take 1 tablet (2,000 Units total) by mouth daily.  Marland Kitchen ezetimibe (ZETIA) 10 MG tablet Take 1 tablet daily for Cholesterol  . latanoprost (XALATAN) 0.005 % ophthalmic solution Place 1 drop into both eyes at bedtime.   Marland Kitchen FISH OIL 1000 MG  Take 1 capsule (1,000 mg total) by mouth 2 (two) times daily.  Marland Kitchen atorvastatin  80 MG tablet Take 1 tablet (80 mg total) by mouth daily. (Patient not taking: Reported on 01/05/2014)   No facility-administered medications prior to visit.   Allergies  Allergen Reactions  . Benadryl [Diphenhydramine Hcl] Other (See Comments)    Hallucinations, itching, shaking   Past Medical History   Diagnosis Date  . Hyperlipidemia   . Hypertension   . Pre-diabetes   . Vitamin D deficiency   . OSA (obstructive sleep apnea)   . RLS (restless legs syndrome)   . Glaucoma    Health Maintenance  Topic Date Due  . INFLUENZA VACCINE  09/05/2014  . COLONOSCOPY  10/05/2016  . TETANUS/TDAP  09/25/2020  . PNEUMOCOCCAL POLYSACCHARIDE VACCINE AGE 31 AND OVER  Completed  . ZOSTAVAX  Completed   Immunization History  Administered Date(s) Administered  . Influenza Whole 11/23/2012  . Influenza, High Dose Seasonal PF 11/23/2013  . Pneumococcal Conjugate-13 01/12/2014  . Pneumococcal Polysaccharide-23 12/02/2008  . Td 09/26/2010  . Zoster 02/25/2008   Past Surgical History  Procedure Laterality Date  . Pilonidal cyst / sinus excision  1959  . Axillary Right 1986    biopsy of axillary nodes   Family History  Problem Relation Age of Onset  . Hypertension Mother   . Diabetes Mother   . Hypertension Father   . Cancer Father    History   Social History  . Marital Status: Married    Spouse Name: N/A  . Number of Children: N/A  . Years of Education: N/A   Occupational History  . Retired Building services engineer   Social History Main Topics  . Smoking status: Former Games developer  . Smokeless tobacco: Not on file  . Alcohol Use: Yes  . Drug Use: No  . Sexual Activity: Not on file  ROS Constitutional: Denies fever, chills, weight loss/gain, headaches, insomnia, fatigue, night sweats or change in appetite. Eyes: Denies redness, blurred vision, diplopia, discharge, itchy or watery eyes.  ENT: Denies discharge, congestion, post nasal drip, epistaxis, sore throat, earache, hearing loss, dental pain, Tinnitus, Vertigo, Sinus pain or snoring.  Cardio: Denies chest pain, palpitations, irregular heartbeat, syncope, dyspnea, diaphoresis, orthopnea, PND, claudication or edema Respiratory: denies cough, dyspnea, DOE, pleurisy, hoarseness, laryngitis or wheezing.   Gastrointestinal: Denies dysphagia, heartburn, reflux, water brash, pain, cramps, nausea, vomiting, bloating, diarrhea, constipation, hematemesis, melena, hematochezia, jaundice or hemorrhoids Genitourinary: Denies dysuria, frequency, urgency, nocturia, hesitancy, discharge, hematuria or flank pain Musculoskeletal: Denies arthralgia, myalgia, stiffness, Jt. Swelling, pain, limp or strain/sprain. Denies Falls. Skin: Denies puritis, rash, hives, warts, acne, eczema or change in skin lesion Neuro: No weakness, tremor, incoordination, spasms, paresthesia or pain Psychiatric: Denies confusion, memory loss or sensory loss. Denies Depression. Endocrine: Denies change in weight, skin, hair change, nocturia, and paresthesia, diabetic polys, visual blurring or hyper / hypo glycemic episodes.  Heme/Lymph: No excessive bleeding, bruising or enlarged lymph nodes.  Physical Exam  BP 126/82 mmHg  Pulse 68  Temp(Src) 97.5 F (36.4 C)  Resp 16  Ht 5' 8.5" (1.74 m)  Wt 166 lb 3.2 oz (75.388 kg)  BMI 24.90 kg/m2  General Appearance: Well nourished, in no apparent distress. Eyes: PERRLA, EOMs, conjunctiva no swelling or erythema, normal fundi and vessels. Sinuses: No frontal/maxillary tenderness ENT/Mouth: EACs patent / TMs  nl. Nares clear without erythema, swelling, mucoid exudates. Oral hygiene is good. No erythema, swelling, or exudate. Tongue normal, non-obstructing. Tonsils not swollen or erythematous. Hearing normal.  Neck: Supple, thyroid normal. No bruits, nodes or JVD. Respiratory: Respiratory effort normal.  BS equal and clear bilateral without rales, rhonci, wheezing or stridor. Cardio: Heart sounds are normal with regular rate and rhythm and no murmurs, rubs or gallops. Peripheral pulses are normal and equal bilaterally without edema. No aortic or femoral bruits. Chest: symmetric with normal excursions and percussion.  Abdomen: Flat, soft, with bowl sounds. Nontender, no guarding, rebound,  hernias, masses, or organomegaly.  Lymphatics: Non tender without lymphadenopathy.  Genitourinary: No hernias.Testes nl. DRE - prostate nl for age - smooth & firm w/o nodules. Musculoskeletal: Full ROM all peripheral extremities, joint stability, 5/5 strength, and normal gait. Skin: Warm and dry without rashes, lesions, cyanosis, clubbing or  ecchymosis.  Neuro: Cranial nerves intact, reflexes equal bilaterally. Normal muscle tone, no cerebellar symptoms. Sensation intact.  Pysch: Awake and oriented X 3 with normal affect, insight and judgment appropriate. Decreased ST recall.   Assessment and Plan   1. Essential hypertension  - Microalbumin / creatinine urine ratio - EKG 12-Lead - US, RETROPERITNL ABD,  LTD - TSH  2. Hyperlipidemia  - Lipid panel  3. Prediabetes  - Hemoglobin A1c - Insulin, fasting  4. Vitamin D deficiency  - Vit D  25 hydroxy (rtn osteoporosis monitoring)  5. Testosterone deficiency  - Testosterone  6. B12 deficiency  - Vitamin B12  7. SDAT (senile dementia of Alzheimer's type)  - Memantine HCl ER 7 & 14 & 21 & 28 MG CP24; Titration pak -As directed and tolerated  8. Screening for rectal cancer  - POC Hemoccult Bld/Stl   9. Screening for prostate cancer  - PSA  10. Screening for depression   11. At low risk for fall   12. Other fatigue  - Iron and TIBC  13. Medication management  - Urine Microscopic - CBC with Differential/Platelet -  BASIC METABOLIC PANEL WITH GFR - Hepatic function panel - Magnesium   Continue prudent diet as discussed, weight control, BP monitoring, regular exercise, and medications as discussed.  Discussed med effects and SE's. Routine screening labs and tests as requested with regular follow-up as recommended.

## 2014-04-07 NOTE — Patient Instructions (Signed)
Preventive Care for Adults A healthy lifestyle and preventive care can promote health and wellness. Preventive health guidelines for men include the following key practices:  A routine yearly physical is a good way to check with your health care provider about your health and preventative screening. It is a chance to share any concerns and updates on your health and to receive a thorough exam.  Visit your dentist for a routine exam and preventative care every 6 months. Brush your teeth twice a day and floss once a day. Good oral hygiene prevents tooth decay and gum disease.  The frequency of eye exams is based on your age, health, family medical history, use of contact lenses, and other factors. Follow your health care provider's recommendations for frequency of eye exams.  Eat a healthy diet. Foods such as vegetables, fruits, whole grains, low-fat dairy products, and lean protein foods contain the nutrients you need without too many calories. Decrease your intake of foods high in solid fats, added sugars, and salt. Eat the right amount of calories for you.Get information about a proper diet from your health care provider, if necessary.  Regular physical exercise is one of the most important things you can do for your health. Most adults should get at least 150 minutes of moderate-intensity exercise (any activity that increases your heart rate and causes you to sweat) each week. In addition, most adults need muscle-strengthening exercises on 2 or more days a week.  Maintain a healthy weight. The body mass index (BMI) is a screening tool to identify possible weight problems. It provides an estimate of body fat based on height and weight. Your health care provider can find your BMI and can help you achieve or maintain a healthy weight.For adults 20 years and older:  A BMI below 18.5 is considered underweight.  A BMI of 18.5 to 24.9 is normal.  A BMI of 25 to 29.9 is considered overweight.  A BMI  of 30 and above is considered obese.  Maintain normal blood lipids and cholesterol levels by exercising and minimizing your intake of saturated fat. Eat a balanced diet with plenty of fruit and vegetables. Blood tests for lipids and cholesterol should begin at age 50 and be repeated every 5 years. If your lipid or cholesterol levels are high, you are over 50, or you are at high risk for heart disease, you may need your cholesterol levels checked more frequently.Ongoing high lipid and cholesterol levels should be treated with medicines if diet and exercise are not working.  If you smoke, find out from your health care provider how to quit. If you do not use tobacco, do not start.  Lung cancer screening is recommended for adults aged 73-80 years who are at high risk for developing lung cancer because of a history of smoking. A yearly low-dose CT scan of the lungs is recommended for people who have at least a 30-pack-year history of smoking and are a current smoker or have quit within the past 15 years. A pack year of smoking is smoking an average of 1 pack of cigarettes a day for 1 year (for example: 1 pack a day for 30 years or 2 packs a day for 15 years). Yearly screening should continue until the smoker has stopped smoking for at least 15 years. Yearly screening should be stopped for people who develop a health problem that would prevent them from having lung cancer treatment.  If you choose to drink alcohol, do not have more than  2 drinks per day. One drink is considered to be 12 ounces (355 mL) of beer, 5 ounces (148 mL) of wine, or 1.5 ounces (44 mL) of liquor.  Avoid use of street drugs. Do not share needles with anyone. Ask for help if you need support or instructions about stopping the use of drugs.  High blood pressure causes heart disease and increases the risk of stroke. Your blood pressure should be checked at least every 1-2 years. Ongoing high blood pressure should be treated with  medicines, if weight loss and exercise are not effective.  If you are 45-79 years old, ask your health care provider if you should take aspirin to prevent heart disease.  Diabetes screening involves taking a blood sample to check your fasting blood sugar level. Testing should be considered at a younger age or be carried out more frequently if you are overweight and have at least 1 risk factor for diabetes.  Colorectal cancer can be detected and often prevented. Most routine colorectal cancer screening begins at the age of 50 and continues through age 75. However, your health care provider may recommend screening at an earlier age if you have risk factors for colon cancer. On a yearly basis, your health care provider may provide home test kits to check for hidden blood in the stool. Use of a small camera at the end of a tube to directly examine the colon (sigmoidoscopy or colonoscopy) can detect the earliest forms of colorectal cancer. Talk to your health care provider about this at age 50, when routine screening begins. Direct exam of the colon should be repeated every 5-10 years through age 75, unless early forms of precancerous polyps or small growths are found.  Hepatitis C blood testing is recommended for all people born from 1945 through 1965 and any individual with known risks for hepatitis C.  Screening for abdominal aortic aneurysm (AAA)  by ultrasound is recommended for people who have history of high blood pressure or who are current or former smokers.  Healthy men should  receive prostate-specific antigen (PSA) blood tests as part of routine cancer screening. Talk with your health care provider about prostate cancer screening.  Testicular cancer screening is  recommended for adult males. Screening includes self-exam, a health care provider exam, and other screening tests. Consult with your health care provider about any symptoms you have or any concerns you have about testicular  cancer.  Use sunscreen. Apply sunscreen liberally and repeatedly throughout the day. You should seek shade when your shadow is shorter than you. Protect yourself by wearing long sleeves, pants, a wide-brimmed hat, and sunglasses year round, whenever you are outdoors.  Once a month, do a whole-body skin exam, using a mirror to look at the skin on your back. Tell your health care provider about new moles, moles that have irregular borders, moles that are larger than a pencil eraser, or moles that have changed in shape or color.  Stay current with required vaccines (immunizations).  Influenza vaccine. All adults should be immunized every year.  Tetanus, diphtheria, and acellular pertussis (Td, Tdap) vaccine. An adult who has not previously received Tdap or who does not know his vaccine status should receive 1 dose of Tdap. This initial dose should be followed by tetanus and diphtheria toxoids (Td) booster doses every 10 years. Adults with an unknown or incomplete history of completing a 3-dose immunization series with Td-containing vaccines should begin or complete a primary immunization series including a Tdap dose. Adults should   receive a Td booster every 10 years.  Zoster vaccine. One dose is recommended for adults aged 60 years or older unless certain conditions are present.    PREVNAR - Pneumococcal 13-valent conjugate (PCV13) vaccine. When indicated, a person who is uncertain of his immunization history and has no record of immunization should receive the PCV13 vaccine. An adult aged 19 years or older who has certain medical conditions and has not been previously immunized should receive 1 dose of PCV13 vaccine. This PCV13 should be followed with a dose of pneumococcal polysaccharide (PPSV23) vaccine. The PPSV23 vaccine dose should be obtained at least 8 weeks after the dose of PCV13 vaccine. An adult aged 19 years or older who has certain medical conditions and previously received 1 or more doses  of PPSV23 vaccine should receive 1 dose of PCV13. The PCV13 vaccine dose should be obtained 1 or more years after the last PPSV23 vaccine dose.    PNEUMOVAX - Pneumococcal polysaccharide (PPSV23) vaccine. When PCV13 is also indicated, PCV13 should be obtained first. All adults aged 65 years and older should be immunized. An adult younger than age 65 years who has certain medical conditions should be immunized. Any person who resides in a nursing home or long-term care facility should be immunized. An adult smoker should be immunized. People with an immunocompromised condition and certain other conditions should receive both PCV13 and PPSV23 vaccines. People with human immunodeficiency virus (HIV) infection should be immunized as soon as possible after diagnosis. Immunization during chemotherapy or radiation therapy should be avoided. Routine use of PPSV23 vaccine is not recommended for American Indians, [place] Natives, or people younger than 65 years unless there are medical conditions that require PPSV23 vaccine. When indicated, people who have unknown immunization and have no record of immunization should receive PPSV23 vaccine. One-time revaccination 5 years after the first dose of PPSV23 is recommended for people aged 19-64 years who have chronic kidney failure, nephrotic syndrome, asplenia, or immunocompromised conditions. People who received 1-2 doses of PPSV23 before age 65 years should receive another dose of PPSV23 vaccine at age 65 years or later if at least 5 years have passed since the previous dose. Doses of PPSV23 are not needed for people immunized with PPSV23 at or after age 65 years.    Hepatitis A vaccine. Adults who wish to be protected from this disease, have certain high-risk conditions, work with hepatitis A-infected animals, work in hepatitis A research labs, or travel to or work in countries with a high rate of hepatitis A should be immunized. Adults who were previously unvaccinated  and who anticipate close contact with an international adoptee during the first 60 days after arrival in the [place] from a country with a high rate of hepatitis A should be immunized.    Hepatitis B vaccine. Adults should be immunized if they wish to be protected from this disease, have certain high-risk conditions, may be exposed to blood or other infectious body fluids, are household contacts or sex partners of hepatitis B positive people, are clients or workers in certain care facilities, or travel to or work in countries with a high rate of hepatitis B.   Preventive Service / Frequency   Ages 65 and over  Blood pressure check.  Lipid and cholesterol check.  Lung cancer screening. / Every year if you are aged 55-80 years and have a 30-pack-year history of smoking and currently smoke or have quit within the past 15 years. Yearly screening is stopped once you   have quit smoking for at least 15 years or develop a health problem that would prevent you from having lung cancer treatment.  Fecal occult blood test (FOBT) of stool. You may not have to do this test if you get a colonoscopy every 10 years.  Flexible sigmoidoscopy** or colonoscopy.** / Every 5 years for a flexible sigmoidoscopy or every 10 years for a colonoscopy beginning at age 73 and continuing until age 73.  Hepatitis C blood test.** / For all people born from [address]45 through 1965 and any individual with known risks for hepatitis C.  Abdominal aortic aneurysm (AAA) screening./ Screening current or former smokers or have Hypertension.  Skin self-exam. / Monthly.  Influenza vaccine. / Every year.  Tetanus, diphtheria, and acellular pertussis (Tdap/Td) vaccine.** / 1 dose of Td every 10 years.   Zoster vaccine.** / 1 dose for adults aged 73 years or older.         Pneumococcal 13-valent conjugate (PCV13) vaccine.    Pneumococcal polysaccharide (PPSV23) vaccine.     Hepatitis A vaccine.** / Consult your health  care provider.  Hepatitis B vaccine.** / Consult your health care provider. Screening for abdominal aortic aneurysm (AAA)  by ultrasound is recommended for people who have history of high blood pressure or who are current or former smokers.    Recommend the book "The END of DIETING" by Dr Monico HoarJoel Fuhrman   & the book "The END of DIABETES " by Dr Monico HoarJoel Fuhrman  At [email or web address] - get book & Audio CD's      Being diabetic has a  300% increased risk for heart attack, stroke, cancer, and alzheimer- type vascular dementia. It is very important that you work harder with diet by avoiding all foods that are white. Avoid white rice (brown & wild rice is OK), white potatoes (sweetpotatoes in moderation is OK), White bread or wheat bread or anything made out of white flour like bagels, donuts, rolls, buns, biscuits, cakes, pastries, cookies, pizza crust, and pasta (made from white flour & egg whites) - vegetarian pasta or spinach or wheat pasta is OK. Multigrain breads like Arnold's or Pepperidge Farm, or multigrain sandwich thins or flatbreads.  Diet, exercise and weight loss can reverse and cure diabetes in the early stages.  Diet, exercise and weight loss is very important in the control and prevention of complications of diabetes which affects every system in your body, ie. Brain - dementia/stroke, eyes - glaucoma/blindness, heart - heart attack/heart failure, kidneys - dialysis, stomach - gastric paralysis, intestines - malabsorption, nerves - severe painful neuritis, circulation - gangrene & loss of a leg(s), and finally cancer and Alzheimers.    I recommend avoid fried & greasy foods,  sweets/candy, white rice (brown or wild rice or Quinoa is OK), white potatoes (sweet potatoes are OK) - anything made from white flour - bagels, doughnuts, rolls, buns, biscuits,white and wheat breads, pizza crust and traditional pasta made of white flour & egg white(vegetarian pasta or spinach or wheat pasta is OK).  Multi-grain  bread is OK - like multi-grain flat bread or sandwich thins. Avoid alcohol in excess. Exercise is also important.    Eat all the vegetables you want - avoid meat, especially red meat and dairy - especially cheese.  Cheese is the most concentrated form of trans-fats which is the worst thing to clog up our arteries. Veggie cheese is OK which can be found in the fresh produce section at [facility] or [employer] or [employer]

## 2014-04-08 LAB — HEPATIC FUNCTION PANEL
ALK PHOS: 50 U/L (ref 39–117)
ALT: 17 U/L (ref 0–53)
AST: 22 U/L (ref 0–37)
Albumin: 4.3 g/dL (ref 3.5–5.2)
BILIRUBIN TOTAL: 0.8 mg/dL (ref 0.2–1.2)
Bilirubin, Direct: 0.2 mg/dL (ref 0.0–0.3)
Indirect Bilirubin: 0.6 mg/dL (ref 0.2–1.2)
Total Protein: 6.6 g/dL (ref 6.0–8.3)

## 2014-04-08 LAB — LIPID PANEL
CHOLESTEROL: 188 mg/dL (ref 0–200)
HDL: 49 mg/dL (ref >=40)
LDL Cholesterol: 110 mg/dL — ABNORMAL HIGH (ref 0–99)
Total CHOL/HDL Ratio: 3.8 Ratio
Triglycerides: 147 mg/dL (ref <150)
VLDL: 29 mg/dL (ref 0–40)

## 2014-04-08 LAB — URINALYSIS, MICROSCOPIC ONLY
Bacteria, UA: NONE SEEN
CASTS: NONE SEEN
CRYSTALS: NONE SEEN
SQUAMOUS EPITHELIAL / LPF: NONE SEEN

## 2014-04-08 LAB — TSH: TSH: 0.778 u[IU]/mL (ref 0.350–4.500)

## 2014-04-08 LAB — BASIC METABOLIC PANEL WITH GFR
BUN: 14 mg/dL (ref 6–23)
CALCIUM: 9.3 mg/dL (ref 8.4–10.5)
CO2: 31 mEq/L (ref 19–32)
CREATININE: 0.79 mg/dL (ref 0.50–1.35)
Chloride: 98 mEq/L (ref 96–112)
GFR, Est Non African American: 89 mL/min
Glucose, Bld: 88 mg/dL (ref 70–99)
Potassium: 4.4 mEq/L (ref 3.5–5.3)
Sodium: 135 mEq/L (ref 135–145)

## 2014-04-08 LAB — IRON AND TIBC
%SAT: 38 % (ref 20–55)
IRON: 128 ug/dL (ref 42–165)
TIBC: 336 ug/dL (ref 215–435)
UIBC: 208 ug/dL (ref 125–400)

## 2014-04-08 LAB — MICROALBUMIN / CREATININE URINE RATIO
Creatinine, Urine: 101.9 mg/dL
MICROALB/CREAT RATIO: 2.9 mg/g (ref 0.0–30.0)
Microalb, Ur: 0.3 mg/dL (ref <2.0)

## 2014-04-08 LAB — PSA: PSA: 2.67 ng/mL (ref <=4.00)

## 2014-04-08 LAB — TESTOSTERONE: TESTOSTERONE: 175 ng/dL — AB (ref 300–890)

## 2014-04-08 LAB — MAGNESIUM: Magnesium: 1.9 mg/dL (ref 1.5–2.5)

## 2014-04-08 LAB — INSULIN, FASTING: Insulin fasting, serum: 3.6 u[IU]/mL (ref 2.0–19.6)

## 2014-04-08 LAB — VITAMIN B12: Vitamin B-12: 598 pg/mL (ref 211–911)

## 2014-04-08 LAB — VITAMIN D 25 HYDROXY (VIT D DEFICIENCY, FRACTURES): VIT D 25 HYDROXY: 68 ng/mL (ref 30–100)

## 2014-04-11 ENCOUNTER — Other Ambulatory Visit: Payer: Self-pay | Admitting: Internal Medicine

## 2014-04-11 DIAGNOSIS — E349 Endocrine disorder, unspecified: Secondary | ICD-10-CM

## 2014-04-11 MED ORDER — TESTOSTERONE 20.25 MG/ACT (1.62%) TD GEL: TRANSDERMAL | Status: DC

## 2014-05-11 ENCOUNTER — Encounter: Payer: Self-pay | Admitting: Internal Medicine

## 2014-05-11 ENCOUNTER — Ambulatory Visit (INDEPENDENT_AMBULATORY_CARE_PROVIDER_SITE_OTHER): Payer: Commercial Managed Care - HMO | Admitting: Internal Medicine

## 2014-05-11 VITALS — BP 126/74 | HR 72 | Temp 98.4°F | Resp 18 | Ht 68.5 in | Wt 166.0 lb

## 2014-05-11 DIAGNOSIS — J441 Chronic obstructive pulmonary disease with (acute) exacerbation: Secondary | ICD-10-CM

## 2014-05-11 DIAGNOSIS — Z1331 Encounter for screening for depression: Secondary | ICD-10-CM

## 2014-05-11 DIAGNOSIS — Z9181 History of falling: Secondary | ICD-10-CM

## 2014-05-11 DIAGNOSIS — E782 Mixed hyperlipidemia: Secondary | ICD-10-CM

## 2014-05-11 DIAGNOSIS — F0280 Dementia in other diseases classified elsewhere without behavioral disturbance: Secondary | ICD-10-CM

## 2014-05-11 DIAGNOSIS — G301 Alzheimer's disease with late onset: Secondary | ICD-10-CM

## 2014-05-11 DIAGNOSIS — J189 Pneumonia, unspecified organism: Secondary | ICD-10-CM

## 2014-05-11 DIAGNOSIS — M797 Fibromyalgia: Secondary | ICD-10-CM

## 2014-05-11 DIAGNOSIS — R6889 Other general symptoms and signs: Secondary | ICD-10-CM

## 2014-05-11 DIAGNOSIS — I10 Essential (primary) hypertension: Secondary | ICD-10-CM

## 2014-05-11 DIAGNOSIS — R7303 Prediabetes: Secondary | ICD-10-CM

## 2014-05-11 DIAGNOSIS — E559 Vitamin D deficiency, unspecified: Secondary | ICD-10-CM

## 2014-05-11 DIAGNOSIS — M790 Rheumatism, unspecified: Secondary | ICD-10-CM

## 2014-05-11 DIAGNOSIS — Z0000 Encounter for general adult medical examination without abnormal findings: Secondary | ICD-10-CM

## 2014-05-11 DIAGNOSIS — Z79899 Other long term (current) drug therapy: Secondary | ICD-10-CM

## 2014-05-11 DIAGNOSIS — Z0001 Encounter for general adult medical examination with abnormal findings: Secondary | ICD-10-CM

## 2014-05-11 MED ORDER — MEMANTINE HCL ER 28 MG PO CP24: 28.0000 mg | ORAL_CAPSULE | Freq: Every day | ORAL | Status: DC

## 2014-05-11 NOTE — Patient Instructions (Signed)
Memantine Tablets  What is this medicine?  MEMANTINE (MEM an teen) is used to treat dementia caused by Alzheimer's disease.  This medicine may be used for other purposes; ask your health care provider or pharmacist if you have questions.  COMMON BRAND NAME(S): Namenda  What should I tell my health care provider before I take this medicine?  They need to know if you have any of these conditions:  -difficulty passing urine  -kidney disease  -liver disease  -seizures  -an unusual or allergic reaction to memantine, other medicines, foods, dyes, or preservatives  -pregnant or trying to get pregnant  -breast-feeding  How should I use this medicine?  Take this medicine by mouth with a glass of water. Follow the directions on the prescription label. You may take this medicine with or without food. Take your doses at regular intervals. Do not take your medicine more often than directed. Continue to take your medicine even if you feel better. Do not stop taking except on the advice of your doctor or health care professional.  Talk to your pediatrician regarding the use of this medicine in children. Special care may be needed.  Overdosage: If you think you have taken too much of this medicine contact a poison control center or emergency room at once.  NOTE: This medicine is only for you. Do not share this medicine with others.  What if I miss a dose?  If you miss a dose, take it as soon as you can. If it is almost time for your next dose, take only that dose. Do not take double or extra doses. If you do not take your medicine for several days, contact your health care provider. Your dose may need to be changed.  What may interact with this medicine?  -acetazolamide  -amantadine  -cimetidine  -dextromethorphan  -dofetilide  -hydrochlorothiazide  -ketamine  -metformin  -methazolamide  -quinidine  -ranitidine  -sodium bicarbonate  -triamterene  This list may not describe all possible interactions. Give your health care provider a  list of all the medicines, herbs, non-prescription drugs, or dietary supplements you use. Also tell them if you smoke, drink alcohol, or use illegal drugs. Some items may interact with your medicine.  What should I watch for while using this medicine?  Visit your doctor or health care professional for regular checks on your progress. Check with your doctor or health care professional if there is no improvement in your symptoms or if they get worse.  You may get drowsy or dizzy. Do not drive, use machinery, or do anything that needs mental alertness until you know how this drug affects you. Do not stand or sit up quickly, especially if you are an older patient. This reduces the risk of dizzy or fainting spells. Alcohol can make you more drowsy and dizzy. Avoid alcoholic drinks.  What side effects may I notice from receiving this medicine?  Side effects that you should report to your doctor or health care professional as soon as possible:  -allergic reactions like skin rash, itching or hives, swelling of the face, lips, or tongue  -agitation or a feeling of restlessness  -depressed mood  -dizziness  -hallucinations  -redness, blistering, peeling or loosening of the skin, including inside the mouth  -seizures  -vomiting  Side effects that usually do not require medical attention (report to your doctor or health care professional if they continue or are bothersome):  -constipation  -diarrhea  -headache  -nausea  -trouble sleeping  This   list may not describe all possible side effects. Call your doctor for medical advice about side effects. You may report side effects to FDA at 1-800-FDA-1088.  Where should I keep my medicine?  Keep out of the reach of children.  Store at room temperature between 15 degrees and 30 degrees C (59 degrees and 86 degrees F). Throw away any unused medicine after the expiration date.  NOTE: This sheet is a summary. It may not cover all possible information. If you have questions about this  medicine, talk to your doctor, pharmacist, or health care provider.  © 2015, [employer]. (2012-11-09 14:10:42)

## 2014-05-11 NOTE — Progress Notes (Signed)
Patient ID: Cameron Mcclain, male   DOB: 1940-05-20, 74 y.o.   MRN: [id number]   MEDICARE ANNUAL WELLNESS VISIT AND CPE  Assessment:    1. Essential hypertension -cont to monitor -dash diet -diet and exercise -call if over 140/85  2. CAP (community acquired pneumonia) -resolved  3. COPD exacerbation -resolved  4. Prediabetes -monitor A1C every 3 months -diet and exercise  5. SDAT (senile dementia of Alzheimer's type) -Namenda -memory exercises  6. Fibromyalgia -cont medications -exercise -relaxation techniques  7. Hyperlipidemia -cont meds -regularly check cholesterol -at goal  8. Medication management -cont current medications  9. Vitamin D deficiency -cont supplement -check regularly     Plan:   During the course of the visit the patient was educated and counseled about appropriate screening and preventive services including:    Pneumococcal vaccine   Influenza vaccine  Td vaccine  Screening electrocardiogram  Bone densitometry screening  Colorectal cancer screening  Diabetes screening  Glaucoma screening  Nutrition counseling   Advanced directives: requested  Screening recommendations, referrals: Vaccinations:  Immunization History  Administered Date(s) Administered  . Influenza Whole 11/23/2012  . Influenza, High Dose Seasonal PF 11/23/2013  . Pneumococcal Conjugate-13 01/12/2014  . Pneumococcal Polysaccharide-23 12/02/2008  . Td 09/26/2010  . Zoster 02/25/2008    Tdap vaccine not indicated Influenza vaccine not indicated Pneumococcal vaccine not indicated Prevnar vaccine not indicated Shingles vaccine not indicated Hep B vaccine not indicated  Nutrition assessed and recommended  Colonoscopy not indicated, patient does not want further colonoscopy would prefer to try cologuard. Recommended yearly ophthalmology/optometry visit for glaucoma screening and checkup Recommended yearly dental visit for hygiene and  checkup Advanced directives - requested  Conditions/risks identified: BMI: Discussed weight loss, diet, and increase physical activity.  Increase physical activity: AHA recommends 150 minutes of physical activity a week.  Medications reviewed PreDiabetes is at goal, ACE/ARB therapy: No, Reason not on Ace Inhibitor/ARB therapy:  because it is not indicated Urinary Incontinence is not an issue: discussed non pharmacology and pharmacology options.  Fall risk: moderate- discussed PT, home fall assessment, medications. Patient offered PT assessment for home safety which he is refusing at this time.  Subjective:    Cameron Mcclain is a 74 y.o.  male who presents for Medicare Annual Wellness Visit and complete physical.  Date of last medicare wellness visit is unknown.  He has had elevated blood pressure since 1998. His blood pressure has been controlled at home, & today their BP is BP: 126/74 mmHg He does not workout. He denies chest pain, shortness of breath, dizziness.   He is on cholesterol medication and denies myalgias. His cholesterol is not at goal. The cholesterol last visit was:  Lab Results  Component Value Date   CHOL 188 04/07/2014   HDL 49 04/07/2014   LDLCALC 110* 04/07/2014   TRIG 147 04/07/2014   CHOLHDL 3.8 04/07/2014    He has had prediabetes and continues to have regular screenings. He has been working on diet and exercise for prediabetes, and denies foot ulcerations, hyperglycemia, hypoglycemia , increased appetite, nausea, paresthesia of the feet, polydipsia, polyuria, visual disturbances, vomiting and weight loss. Last A1C in the office was:  Lab Results  Component Value Date   HGBA1C 5.6 04/07/2014    Patient is on Vitamin D supplement.   Lab Results  Component Value Date   VD25OH 34 04/07/2014     He was recently started on namenda for dementia last month.  He reports no side effects.  They have not noticed any change in mental decline at this time.  They  are unsure whether it is of any benefit.  They would like to stay on it.   Names of Other Physician/Practitioners you currently use: 1. [facility] here for primary care 2. Emily Filbert, eye doctor, last visit 2015 3. Dr. Leonides Grills, dentist, last visit 2 visits per year  Patient Care Team: Lucky Cowboy, MD as PCP - General (Internal Medicine)  Medication Review: Current Outpatient Prescriptions on File Prior to Visit  Medication Sig Dispense Refill  . Apoaequorin (PREVAGEN PO) Take by mouth daily.    . B Complex-C (SUPER B COMPLEX PO) Take by mouth daily.    . Calcium-Magnesium-Vitamin D (CALCIUM MAGNESIUM PO) Take 1 tablet by mouth daily.    . Cholecalciferol (VITAMIN D) 2000 UNITS tablet Take 1 tablet (2,000 Units total) by mouth daily.    Marland Kitchen ezetimibe (ZETIA) 10 MG tablet Take 1 tablet daily for Cholesterol 30 tablet 99  . latanoprost (XALATAN) 0.005 % ophthalmic solution Place 1 drop into both eyes at bedtime.     . Memantine HCl ER 7 & 14 & 21 &28 MG CP24 As directed and tolerated    . Omega-3 Fatty Acids (FISH OIL) 1000 MG CAPS Take 1 capsule (1,000 mg total) by mouth 2 (two) times daily.  0  . Testosterone (ANDROGEL PUMP) 20.25 MG/ACT (1.62%) GEL Apply 2 pumps daily to shoulders or thighs 75 g 5   No current facility-administered medications on file prior to visit.    Current Problems (verified) Patient Active Problem List   Diagnosis Date Noted  . Medication management 07/01/2013  . COPD exacerbation 05/07/2013  . Acute respiratory failure 05/04/2013  . CAP (community acquired pneumonia) 05/03/2013  . Essential hypertension 12/29/2012  . Hyperlipidemia 12/29/2012  . Prediabetes 12/29/2012  . Vitamin D deficiency 12/29/2012  . SDAT (senile dementia of Alzheimer's type) 12/29/2012  . Fibromyalgia 12/29/2012    Screening Tests Health Maintenance  Topic Date Due  . INFLUENZA VACCINE  09/05/2014  . COLONOSCOPY  10/05/2016  . TETANUS/TDAP   09/25/2020  . ZOSTAVAX  Completed  . PNA vac Low Risk Adult  Completed    Immunization History  Administered Date(s) Administered  . Influenza Whole 11/23/2012  . Influenza, High Dose Seasonal PF 11/23/2013  . Pneumococcal Conjugate-13 01/12/2014  . Pneumococcal Polysaccharide-23 12/02/2008  . Td 09/26/2010  . Zoster 02/25/2008    Preventative care: Last colonoscopy: 1995, he is not interested in any more colonoscopies and would prefer to try cologuard.    History reviewed: allergies, current medications, past family history, past medical history, past social history, past surgical history and problem list  Risk Factors: Tobacco History  Substance Use Topics  . Smoking status: Former Smoker    Quit date: 05/10/2009  . Smokeless tobacco: Not on file  . Alcohol Use: 0.0 oz/week    0 Standard drinks or equivalent per week   He does not smoke.  Patient is not a former smoker. Are there smokers in your home (other than you)?  No Alcohol Current alcohol use: social drinker  Caffeine Current caffeine use: coffee 3 /day  Exercise Current exercise: yard work  Nutrition/Diet Current diet: in general, a "healthy" diet    Cardiac risk factors: advanced age (older than 53 for men, 69 for women), diabetes mellitus, dyslipidemia, hypertension and sedentary lifestyle.  Depression Screen (Note: if answer to either of the following is "Yes", a more complete depression screening  is indicated)   Q1: Over the past two weeks, have you felt down, depressed or hopeless? No  Q2: Over the past two weeks, have you felt little interest or pleasure in doing things? No  Have you lost interest or pleasure in daily life? No  Do you often feel hopeless? No  Do you cry easily over simple problems? No  Activities of Daily Living In your present state of health, do you have any difficulty performing the following activities?:  Driving? No Managing money?  No Feeding yourself? No Getting from  bed to chair? No Climbing a flight of stairs? No Preparing food and eating?: No Bathing or showering? No Getting dressed: No Getting to the toilet? No Using the toilet:No Moving around from place to place: No In the past year have you fallen or had a near fall?:Yes   Are you sexually active?  No  Do you have more than one partner?  No  Vision Difficulties: No  Hearing Difficulties: No Do you often ask people to speak up or repeat themselves? No Do you experience ringing or noises in your ears? No Do you have difficulty understanding soft or whispered voices? Sometimes.  Cognition  Do you feel that you have a problem with memory?No  Do you often misplace items? No  Do you feel safe at home?  Yes  Advanced directives Does patient have a Health Care Power of Attorney? Yes Does patient have a Living Will? Yes  Past Medical History  Diagnosis Date  . Hyperlipidemia   . Hypertension   . Pre-diabetes   . Vitamin D deficiency   . OSA (obstructive sleep apnea)   . RLS (restless legs syndrome)   . Glaucoma    Past Surgical History  Procedure Laterality Date  . Pilonidal cyst / sinus excision  1959  . Axillary Right 1986    biopsy of axillary nodes    ROS: Constitutional: Denies fever, chills, weight loss/gain, headaches, insomnia, fatigue, night sweats, and change in appetite. Eyes: Denies redness, blurred vision, diplopia, discharge, itchy, watery eyes.  ENT: Denies discharge, congestion, post nasal drip, epistaxis, sore throat, earache, hearing loss, dental pain, Tinnitus, Vertigo, Sinus pain, snoring.  Cardio: Denies chest pain, palpitations, irregular heartbeat, syncope, dyspnea, diaphoresis, orthopnea, PND, claudication, edema Respiratory: denies cough, dyspnea, DOE, pleurisy, hoarseness, laryngitis, wheezing.  Gastrointestinal: Denies dysphagia, heartburn, reflux, water brash, pain, cramps, nausea, vomiting, bloating, diarrhea, constipation, hematemesis, melena,  hematochezia, jaundice, hemorrhoids Genitourinary: Denies dysuria, frequency, urgency, nocturia, hesitancy, discharge, hematuria, flank pain Musculoskeletal: Denies arthralgia, myalgia, stiffness, Jt. Swelling, pain, limp, and strain/sprain. Admits to some near falls. Skin: Denies puritis, rash, hives, warts, acne, eczema, changing in skin lesion Neuro: No weakness, tremor, incoordination, spasms, paresthesia, pain Psychiatric: Denies, sensory loss. Denies Depression.  Does have some confusion and memory loss with dementia.  Endocrine: Denies change in weight, skin, hair change, nocturia, and paresthesia, diabetic polys, visual blurring, hyper / hypo glycemic episodes.  Heme/Lymph: No excessive bleeding, bruising, enlarged lymph nodes  Objective:     BP 126/74 mmHg  Pulse 72  Temp(Src) 98.4 F (36.9 C) (Temporal)  Resp 18  Ht 5' 8.5" (1.74 m)  Wt 166 lb (75.297 kg)  BMI 24.87 kg/m2  General Appearance: Well nourished, alert, WD/WN, male and in no apparent distress. Eyes: PERRLA, EOMs, conjunctiva no swelling or erythema, normal fundi and vessels. Sinuses: No frontal/maxillary tenderness ENT/Mouth: EACs patent / TMs  nl. Nares clear without erythema, swelling, mucoid exudates. Oral hygiene is good. No erythema,  swelling, or exudate. Tongue normal, non-obstructing. Tonsils not swollen or erythematous. Hearing normal.  Neck: Supple, thyroid normal. No bruits, nodes or JVD. Respiratory: Respiratory effort normal.  BS equal and clear bilateral without rales, rhonci, wheezing or stridor. Cardio: Heart sounds are normal with regular rate and rhythm and no murmurs, rubs or gallops. Peripheral pulses are normal and equal bilaterally without edema. No aortic or femoral bruits. Chest: symmetric with normal excursions and percussion. Breasts: Symmetric, without lumps, nipple discharge, retractions, or fibrocystic changes.  Abdomen: Flat, soft  with nl bowel sounds. Nontender, no guarding, rebound,  hernias, masses, or organomegaly.  Lymphatics: Non tender without lymphadenopathy.  Musculoskeletal: Full ROM all peripheral extremities, joint stability, 5/5 strength, and normal gait. Skin: Warm and dry without rashes, lesions, cyanosis, clubbing or  ecchymosis.  Neuro: Cranial nerves intact, reflexes equal bilaterally. Normal muscle tone, no cerebellar symptoms. Sensation intact.  Pysch: Alert and oriented X 3, normal affect, Insight and Judgment appropriate.   Cognitive Testing  Alert? Yes  Normal Appearance?Yes  Oriented to person? Yes  Place? Yes   Time? Yes  Recall of three objects?  Yes  Can perform simple calculations? Yes  Displays appropriate judgment? Yes  Can read the correct time from a watch/clock?Yes  Medicare Attestation I have personally reviewed: The patient's medical and social history Their use of alcohol, tobacco or illicit drugs Their current medications and supplements The patient's functional ability including ADLs,fall risks, home safety risks, cognitive, and hearing and visual impairment Diet and physical activities Evidence for depression or mood disorders  The patient's weight, height, BMI, and visual acuity have been recorded in the chart.  I have made referrals, counseling, and provided education to the patient based on review of the above and I have provided the patient with a written personalized care plan for preventive services.  Over 40 minutes of exam, counseling, chart review was performed.   Terri PiedraFORCUCCI, Sukari Grist, PA-C   05/11/2014

## 2014-07-15 ENCOUNTER — Ambulatory Visit: Payer: Self-pay | Admitting: Internal Medicine

## 2014-07-15 ENCOUNTER — Encounter: Payer: Self-pay | Admitting: Internal Medicine

## 2014-07-15 ENCOUNTER — Ambulatory Visit (INDEPENDENT_AMBULATORY_CARE_PROVIDER_SITE_OTHER): Payer: Commercial Managed Care - HMO | Admitting: Internal Medicine

## 2014-07-15 VITALS — BP 122/80 | HR 64 | Temp 98.4°F | Resp 16 | Ht 68.5 in | Wt 166.0 lb

## 2014-07-15 DIAGNOSIS — F0280 Dementia in other diseases classified elsewhere without behavioral disturbance: Secondary | ICD-10-CM

## 2014-07-15 DIAGNOSIS — I10 Essential (primary) hypertension: Secondary | ICD-10-CM

## 2014-07-15 DIAGNOSIS — E782 Mixed hyperlipidemia: Secondary | ICD-10-CM

## 2014-07-15 DIAGNOSIS — Z79899 Other long term (current) drug therapy: Secondary | ICD-10-CM

## 2014-07-15 DIAGNOSIS — R7309 Other abnormal glucose: Secondary | ICD-10-CM

## 2014-07-15 DIAGNOSIS — G308 Other Alzheimer's disease: Secondary | ICD-10-CM

## 2014-07-15 DIAGNOSIS — E559 Vitamin D deficiency, unspecified: Secondary | ICD-10-CM

## 2014-07-15 DIAGNOSIS — G301 Alzheimer's disease with late onset: Secondary | ICD-10-CM

## 2014-07-15 DIAGNOSIS — R7303 Prediabetes: Secondary | ICD-10-CM

## 2014-07-15 LAB — BASIC METABOLIC PANEL WITH GFR
BUN: 12 mg/dL (ref 6–23)
CO2: 25 mEq/L (ref 19–32)
Calcium: 9.2 mg/dL (ref 8.4–10.5)
Chloride: 98 mEq/L (ref 96–112)
Creat: 0.86 mg/dL (ref 0.50–1.35)
GFR, Est African American: >89 mL/min
GFR, Est Non African American: 86 mL/min
Glucose, Bld: 87 mg/dL (ref 70–99)
Potassium: 4.6 mEq/L (ref 3.5–5.3)
Sodium: 139 mEq/L (ref 135–145)

## 2014-07-15 LAB — CBC WITH DIFFERENTIAL/PLATELET
Basophils Absolute: 0.1 10*3/uL (ref 0.0–0.1)
Basophils Relative: 1 % (ref 0–1)
EOS ABS: 0.2 10*3/uL (ref 0.0–0.7)
Eosinophils Relative: 2 % (ref 0–5)
HEMATOCRIT: 47.7 % (ref 39.0–52.0)
HEMOGLOBIN: 16.3 g/dL (ref 13.0–17.0)
LYMPHS PCT: 42 % (ref 12–46)
Lymphs Abs: 3.6 10*3/uL (ref 0.7–4.0)
MCH: 33.1 pg (ref 26.0–34.0)
MCHC: 34.2 g/dL (ref 30.0–36.0)
MCV: 96.8 fL (ref 78.0–100.0)
MPV: 10.3 fL (ref 8.6–12.4)
Monocytes Absolute: 0.9 10*3/uL (ref 0.1–1.0)
Monocytes Relative: 10 % (ref 3–12)
NEUTROS ABS: 3.9 10*3/uL (ref 1.7–7.7)
Neutrophils Relative %: 45 % (ref 43–77)
PLATELETS: 239 10*3/uL (ref 150–400)
RBC: 4.93 MIL/uL (ref 4.22–5.81)
RDW: 12.9 % (ref 11.5–15.5)
WBC: 8.6 10*3/uL (ref 4.0–10.5)

## 2014-07-15 LAB — HEPATIC FUNCTION PANEL
ALBUMIN: 4.2 g/dL (ref 3.5–5.2)
ALT: 14 U/L (ref 0–53)
AST: 16 U/L (ref 0–37)
Alkaline Phosphatase: 54 U/L (ref 39–117)
Bilirubin, Direct: 0.1 mg/dL (ref 0.0–0.3)
Indirect Bilirubin: 0.7 mg/dL (ref 0.2–1.2)
TOTAL PROTEIN: 6.4 g/dL (ref 6.0–8.3)
Total Bilirubin: 0.8 mg/dL (ref 0.2–1.2)

## 2014-07-15 LAB — LIPID PANEL
CHOL/HDL RATIO: 3.3 ratio
Cholesterol: 197 mg/dL (ref 0–200)
HDL: 60 mg/dL (ref >=40)
LDL Cholesterol: 117 mg/dL — ABNORMAL HIGH (ref 0–99)
Triglycerides: 98 mg/dL (ref <150)
VLDL: 20 mg/dL (ref 0–40)

## 2014-07-15 LAB — MAGNESIUM: Magnesium: 1.9 mg/dL (ref 1.5–2.5)

## 2014-07-15 LAB — HEMOGLOBIN A1C
Hgb A1c MFr Bld: 5.6 % (ref <5.7)
Mean Plasma Glucose: 114 mg/dL (ref <117)

## 2014-07-15 NOTE — Patient Instructions (Signed)

## 2014-07-15 NOTE — Progress Notes (Signed)
Patient ID: Cameron Mcclain, male   DOB: November 07, 1940, 74 y.o.   MRN: [id number]  Assessment and Plan:  Hypertension:  -Continue medication,  -monitor blood pressure at home.  -Continue DASH diet.   -Reminder to go to the ER if any CP, SOB, nausea, dizziness, severe HA, changes vision/speech, left arm numbness and tingling, and jaw pain.  Cholesterol: -Continue diet and exercise.  -Check cholesterol.   Pre-diabetes: -Continue diet and exercise.  -Check A1C  Vitamin D Def: -check level -continue medications.   Testosterone Def -stop androgel -take testosterone holiday -pt to call and let us know if he wants to do injections/shots  Continue diet and meds as discussed. Further disposition pending results of labs.  HPI 74 y.o. male  presents for 3 month follow up with hypertension, hyperlipidemia, prediabetes and vitamin D.   His blood pressure has been controlled at home, today their BP is BP: 122/80 mmHg.   He does workout. He denies chest pain, shortness of breath, dizziness.  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     He is on cholesterol medication and denies myalgias. His cholesterol is not at goal. The cholesterol last visit was:   Lab Results  Component Value Date   CHOL 188 04/07/2014   HDL 49 04/07/2014   LDLCALC 110* 04/07/2014   TRIG 147 04/07/2014   CHOLHDL 3.8 04/07/2014     He has been working on diet and exercise for prediabetes, and denies foot ulcerations, hyperglycemia, hypoglycemia , increased appetite, nausea, paresthesia of the feet, polydipsia, polyuria, visual disturbances, vomiting and weight loss. Last A1C in the office was:  Lab Results  Component Value Date   HGBA1C 5.6 04/07/2014    Patient is on Vitamin D supplement.  Lab Results  Component Value Date   VD25OH 68 04/07/2014      Patient currently on androgel.  He wishes to come off this due to cost.  He would like to try testosterone holiday to determine  whether he needs to have injections or would rather stay off medication.    Memory has continued to be stable.  Current Medications:  Current Outpatient Prescriptions on File Prior to Visit  Medication Sig Dispense Refill  . Apoaequorin (PREVAGEN PO) Take by mouth daily.    . B Complex-C (SUPER B COMPLEX PO) Take by mouth daily.    . Calcium-Magnesium-Vitamin D (CALCIUM MAGNESIUM PO) Take 1 tablet by mouth daily.    . Cholecalciferol (VITAMIN D) 2000 UNITS tablet Take 1 tablet (2,000 Units total) by mouth daily.    Marland Kitchen ezetimibe (ZETIA) 10 MG tablet Take 1 tablet daily for Cholesterol 30 tablet 99  . latanoprost (XALATAN) 0.005 % ophthalmic solution Place 1 drop into both eyes at bedtime.     . memantine (NAMENDA XR) 28 MG CP24 24 hr capsule Take 1 capsule (28 mg total) by mouth daily. 30 capsule 3  . Omega-3 Fatty Acids (FISH OIL) 1000 MG CAPS Take 1 capsule (1,000 mg total) by mouth 2 (two) times daily.  0  . Testosterone (ANDROGEL PUMP) 20.25 MG/ACT (1.62%) GEL Apply 2 pumps daily to shoulders or thighs 75 g 5   No current facility-administered medications on file prior to visit.    Medical History:  Past Medical History  Diagnosis Date  . Hyperlipidemia   . Hypertension   . Pre-diabetes   . Vitamin D deficiency   . OSA (obstructive sleep apnea)   .  RLS (restless legs syndrome)   . Glaucoma     Allergies:  Allergies  Allergen Reactions  . Benadryl [Diphenhydramine Hcl] Other (See Comments)    Hallucinations, itching, shaking     Review of Systems:  Review of Systems  Constitutional: Negative for fever, chills and malaise/fatigue.  HENT: Negative for congestion, ear discharge, nosebleeds and sore throat.   Eyes: Negative.   Respiratory: Negative for cough, shortness of breath and wheezing.   Cardiovascular: Negative for chest pain, palpitations and leg swelling.  Gastrointestinal: Negative for heartburn, nausea, abdominal pain, diarrhea, constipation, blood in stool and  melena.  Genitourinary: Negative.   Skin: Negative.   Neurological: Negative for dizziness, sensory change, loss of consciousness and headaches.  Psychiatric/Behavioral: Positive for memory loss. Negative for depression and suicidal ideas. The patient is not nervous/anxious.     Family history- Review and unchanged  Social history- Review and unchanged  Physical Exam: BP 122/80 mmHg  Pulse 64  Temp(Src) 98.4 F (36.9 C) (Temporal)  Resp 16  Ht 5' 8.5" (1.74 m)  Wt 166 lb (75.297 kg)  BMI 24.87 kg/m2 Wt Readings from Last 3 Encounters:  07/15/14 166 lb (75.297 kg)  05/11/14 166 lb (75.297 kg)  04/07/14 166 lb 3.2 oz (75.388 kg)    General Appearance: Well nourished well developed, in no apparent distress. Eyes: PERRLA, EOMs, conjunctiva no swelling or erythema ENT/Mouth: Ear canals normal without obstruction, swelling, erythma, discharge.  TMs normal bilaterally.  Oropharynx moist, clear, without exudate, or postoropharyngeal swelling. Neck: Supple, thyroid normal,no cervical adenopathy  Respiratory: Respiratory effort normal, Breath sounds clear A&P without rhonchi, wheeze, or rale.  No retractions, no accessory usage. Cardio: RRR with no MRGs. Brisk peripheral pulses without edema.  Abdomen: Soft, + BS,  Non tender, no guarding, rebound, hernias, masses. Musculoskeletal: Full ROM, 5/5 strength, Normal gait Skin: Warm, dry without rashes, lesions, ecchymosis.  Neuro: Awake and oriented X 3, Cranial nerves intact. Normal muscle tone, no cerebellar symptoms. Psych: Normal affect, Insight and Judgment appropriate.    FORCUCCI, Toni Amend, PA-C 9:47 AM [facility]

## 2014-07-16 LAB — VITAMIN D 25 HYDROXY (VIT D DEFICIENCY, FRACTURES): Vit D, 25-Hydroxy: 67 ng/mL (ref 30–100)

## 2014-07-16 LAB — INSULIN, RANDOM: Insulin: 4.2 u[IU]/mL (ref 2.0–19.6)

## 2014-07-16 LAB — TSH: TSH: 0.765 u[IU]/mL (ref 0.350–4.500)

## 2014-10-19 ENCOUNTER — Encounter: Payer: Self-pay | Admitting: Internal Medicine

## 2014-10-19 ENCOUNTER — Ambulatory Visit (INDEPENDENT_AMBULATORY_CARE_PROVIDER_SITE_OTHER): Payer: Commercial Managed Care - HMO | Admitting: Internal Medicine

## 2014-10-19 VITALS — BP 110/80 | HR 64 | Temp 97.5°F | Resp 16 | Ht 68.5 in | Wt 162.0 lb

## 2014-10-19 DIAGNOSIS — Z6823 Body mass index (BMI) 23.0-23.9, adult: Secondary | ICD-10-CM | POA: Insufficient documentation

## 2014-10-19 DIAGNOSIS — E559 Vitamin D deficiency, unspecified: Secondary | ICD-10-CM | POA: Diagnosis not present

## 2014-10-19 DIAGNOSIS — H409 Unspecified glaucoma: Secondary | ICD-10-CM | POA: Insufficient documentation

## 2014-10-19 DIAGNOSIS — E782 Mixed hyperlipidemia: Secondary | ICD-10-CM | POA: Diagnosis not present

## 2014-10-19 DIAGNOSIS — R7309 Other abnormal glucose: Secondary | ICD-10-CM

## 2014-10-19 DIAGNOSIS — G308 Other Alzheimer's disease: Secondary | ICD-10-CM

## 2014-10-19 DIAGNOSIS — I10 Essential (primary) hypertension: Secondary | ICD-10-CM | POA: Diagnosis not present

## 2014-10-19 DIAGNOSIS — Z23 Encounter for immunization: Secondary | ICD-10-CM | POA: Diagnosis not present

## 2014-10-19 DIAGNOSIS — Z6824 Body mass index (BMI) 24.0-24.9, adult: Secondary | ICD-10-CM | POA: Diagnosis not present

## 2014-10-19 DIAGNOSIS — Z79899 Other long term (current) drug therapy: Secondary | ICD-10-CM

## 2014-10-19 DIAGNOSIS — R7303 Prediabetes: Secondary | ICD-10-CM

## 2014-10-19 DIAGNOSIS — F0280 Dementia in other diseases classified elsewhere without behavioral disturbance: Secondary | ICD-10-CM

## 2014-10-19 DIAGNOSIS — G4733 Obstructive sleep apnea (adult) (pediatric): Secondary | ICD-10-CM | POA: Insufficient documentation

## 2014-10-19 DIAGNOSIS — G301 Alzheimer's disease with late onset: Secondary | ICD-10-CM

## 2014-10-19 LAB — CBC WITH DIFFERENTIAL/PLATELET
Basophils Absolute: 0.1 10*3/uL (ref 0.0–0.1)
Basophils Relative: 1 % (ref 0–1)
EOS PCT: 5 % (ref 0–5)
Eosinophils Absolute: 0.3 10*3/uL (ref 0.0–0.7)
HCT: 44.4 % (ref 39.0–52.0)
HEMOGLOBIN: 15.3 g/dL (ref 13.0–17.0)
LYMPHS ABS: 3.1 10*3/uL (ref 0.7–4.0)
LYMPHS PCT: 48 % — AB (ref 12–46)
MCH: 32.8 pg (ref 26.0–34.0)
MCHC: 34.5 g/dL (ref 30.0–36.0)
MCV: 95.3 fL (ref 78.0–100.0)
MONO ABS: 0.6 10*3/uL (ref 0.1–1.0)
MPV: 10.1 fL (ref 8.6–12.4)
Monocytes Relative: 9 % (ref 3–12)
NEUTROS ABS: 2.4 10*3/uL (ref 1.7–7.7)
Neutrophils Relative %: 37 % — ABNORMAL LOW (ref 43–77)
Platelets: 234 10*3/uL (ref 150–400)
RBC: 4.66 MIL/uL (ref 4.22–5.81)
RDW: 12.8 % (ref 11.5–15.5)
WBC: 6.5 10*3/uL (ref 4.0–10.5)

## 2014-10-19 NOTE — Progress Notes (Signed)
Patient ID: Cameron Mcclain, male   DOB: 1940/10/04, 74 y.o.   MRN: [id number]   This very nice 74 y.o. MWM presents for 6 month follow up with Hypertension, Hyperlipidemia, Pre-Diabetes, SDAT  and Vitamin D Deficiency. Patient also has Dx of SDAT and had allegedly been intolerant to Aricept and since last OV she has stopped his Namenda XR. Patient has OSA and RLS and he is using his CPAP.    Patient is treated for HTN since 1998 & BP has been controlled at home. Today's BP: 110/80 mmHg. Patient has had no complaints of any cardiac type chest pain, palpitations, dyspnea/orthopnea/PND, dizziness, claudication, or dependent edema.   Hyperlipidemia is not controlled with diet & note that wife has stopped his Zeia and she previously had stopped his statins as she felt that it was worsening his memory.   Last Lipids were not at goal - Cholesterol 197; HDL 60; LDL 117*; Triglycerides 98 on  07/15/2014.   Also, the patient has history of PreDiabetes with A1c 5.8% in 2011 and has had no symptoms of reactive hypoglycemia, diabetic polys, paresthesias or visual blurring.  Last A1c was  5.6% on 07/15/2014.    Further, the patient also has history of Vitamin D Deficiency of 24 in 2008 and supplements vitamin D without any suspected side-effects. Last vitamin D was 67 on 07/15/2014.   Medication Sig  . Apoaequorin (PREVAGEN PO) Take by mouth daily.  . SUPER B COMPLEX Take by mouth daily.  . Calcium-Magnesium-Vitamin D  Take 1 tablet by mouth daily.  Marland Kitchen VITAMIN D 2000 UNITS  Take 1 tablet (2,000 Units total) by mouth daily.  Marland Kitchen latanoprost (XALATAN) 0.005 % ophth soln Place 1 drop into both eyes at bedtime.   Marland Kitchen FISH OIL 1000 MG CAPS Take 1 capsule (1,000 mg total) by mouth 2 (two) times daily.   Allergies  Allergen Reactions  . Benadryl [Diphenhydramine Hcl] Other (See Comments)    Hallucinations, itching, shaking   PMHx:   Past Medical History  Diagnosis Date  . Vitamin D deficiency   . OSA (obstructive  sleep apnea)   . RLS (restless legs syndrome)   . Glaucoma    Immunization History  Administered Date(s) Administered  . Influenza Whole 11/23/2012  . Influenza, High Dose Seasonal PF 11/23/2013, 10/19/2014  . Pneumococcal Conjugate-13 01/12/2014  . Pneumococcal Polysaccharide-23 12/02/2008  . Td 09/26/2010  . Zoster 02/25/2008   Past Surgical History  Procedure Laterality Date  . Pilonidal cyst / sinus excision  1959  . Axillary Right 1986    biopsy of axillary nodes   FHx:    Reviewed / unchanged  SHx:    Reviewed / unchanged  Systems Review:  Constitutional: Denies fever, chills, wt changes, headaches, insomnia, fatigue, night sweats, change in appetite. Eyes: Denies redness, blurred vision, diplopia, discharge, itchy, watery eyes.  ENT: Denies discharge, congestion, post nasal drip, epistaxis, sore throat, earache, hearing loss, dental pain, tinnitus, vertigo, sinus pain, snoring.  CV: Denies chest pain, palpitations, irregular heartbeat, syncope, dyspnea, diaphoresis, orthopnea, PND, claudication or edema. Respiratory: denies cough, dyspnea, DOE, pleurisy, hoarseness, laryngitis, wheezing.  Gastrointestinal: Denies dysphagia, odynophagia, heartburn, reflux, water brash, abdominal pain or cramps, nausea, vomiting, bloating, diarrhea, constipation, hematemesis, melena, hematochezia  or hemorrhoids. Genitourinary: Denies dysuria, frequency, urgency, nocturia, hesitancy, discharge, hematuria or flank pain. Musculoskeletal: Denies arthralgias, myalgias, stiffness, jt. swelling, pain, limping or strain/sprain.  Skin: Denies pruritus, rash, hives, warts, acne, eczema or change in skin lesion(s). Neuro: No weakness, tremor,  incoordination, spasms, paresthesia or pain. Psychiatric: Denies confusion, memory loss or sensory loss. Endo: Denies change in weight, skin or hair change.  Heme/Lymph: No excessive bleeding, bruising or enlarged lymph nodes.  Physical Exam  BP 110/80 mmHg   Pulse 64  Temp(Src) 97.5 F (36.4 C)  Resp 16  Ht 5' 8.5" (1.74 m)  Wt 162 lb (73.483 kg)  BMI 24.27 kg/m2  Appears well nourished and in no distress. Eyes: PERRLA, EOMs, conjunctiva no swelling or erythema. Sinuses: No frontal/maxillary tenderness ENT/Mouth: EAC's clear, TM's nl w/o erythema, bulging. Nares clear w/o erythema, swelling, exudates. Oropharynx clear without erythema or exudates. Oral hygiene is good. Tongue normal, non obstructing. Hearing intact.  Neck: Supple. Thyroid nl. Car 2+/2+ without bruits, nodes or JVD. Chest: Respirations nl with BS clear & equal w/o rales, rhonchi, wheezing or stridor.  Cor: Heart sounds normal w/ regular rate and rhythm without sig. murmurs, gallops, clicks, or rubs. Peripheral pulses normal and equal  without edema.  Abdomen: Soft & bowel sounds normal. Non-tender w/o guarding, rebound, hernias, masses, or organomegaly.  Lymphatics: Unremarkable.  Musculoskeletal: Full ROM all peripheral extremities, joint stability, 5/5 strength, and normal gait.  Skin: Warm, dry without exposed rashes, lesions or ecchymosis apparent.  Neuro: Cranial nerves intact, reflexes equal bilaterally. Sensory-motor testing grossly intact. Tendon reflexes grossly intact.  Pysch: Alert & oriented x 3.  Insight and judgement nl & appropriate. No ideations.  Assessment and Plan:  1. Essential hypertension  - TSH  2. Hyperlipidemia  - Lipid panel  3. Prediabetes  - Hemoglobin A1c - Insulin, random  4. Vitamin D deficiency  - Vit D  25 hydroxy   5. SDAT    6. Need for prophylactic vaccination and inoculation against influenza  - Flu vaccine HIGH DOSE PF (Fluzone High dose)  7. Medication management  - CBC with Differential/Platelet - BASIC METABOLIC PANEL WITH GFR - Hepatic function panel - Magnesium  8. Body mass index (BMI) of 24.0-24.9    Recommended regular exercise, BP monitoring, weight control, and discussed med and SE's. Recommended labs  to assess and monitor clinical status. Further disposition pending results of labs. Over 30 minutes of exam, counseling, chart review was performed

## 2014-10-19 NOTE — Patient Instructions (Signed)

## 2014-10-20 LAB — HEPATIC FUNCTION PANEL
ALK PHOS: 45 U/L (ref 40–115)
ALT: 12 U/L (ref 9–46)
AST: 16 U/L (ref 10–35)
Albumin: 4.1 g/dL (ref 3.6–5.1)
BILIRUBIN DIRECT: 0.1 mg/dL (ref <=0.2)
BILIRUBIN INDIRECT: 0.7 mg/dL (ref 0.2–1.2)
BILIRUBIN TOTAL: 0.8 mg/dL (ref 0.2–1.2)
Total Protein: 6.3 g/dL (ref 6.1–8.1)

## 2014-10-20 LAB — LIPID PANEL
CHOL/HDL RATIO: 4.4 ratio (ref <=5.0)
CHOLESTEROL: 193 mg/dL (ref 125–200)
HDL: 44 mg/dL (ref >=40)
LDL CALC: 124 mg/dL (ref <130)
TRIGLYCERIDES: 126 mg/dL (ref <150)
VLDL: 25 mg/dL (ref <30)

## 2014-10-20 LAB — TSH: TSH: 0.73 u[IU]/mL (ref 0.350–4.500)

## 2014-10-20 LAB — BASIC METABOLIC PANEL WITH GFR
BUN: 18 mg/dL (ref 7–25)
CO2: 28 mmol/L (ref 20–31)
Calcium: 9.3 mg/dL (ref 8.6–10.3)
Chloride: 99 mmol/L (ref 98–110)
Creat: 0.84 mg/dL (ref 0.70–1.18)
GFR, Est Non African American: 87 mL/min (ref >=60)
Glucose, Bld: 81 mg/dL (ref 65–99)
POTASSIUM: 4.3 mmol/L (ref 3.5–5.3)
Sodium: 135 mmol/L (ref 135–146)

## 2014-10-20 LAB — MAGNESIUM: Magnesium: 1.9 mg/dL (ref 1.5–2.5)

## 2014-10-20 LAB — INSULIN, RANDOM: Insulin: 7.9 u[IU]/mL (ref 2.0–19.6)

## 2014-10-20 LAB — HEMOGLOBIN A1C
Hgb A1c MFr Bld: 5.6 % (ref <5.7)
Mean Plasma Glucose: 114 mg/dL (ref <117)

## 2014-10-20 LAB — VITAMIN D 25 HYDROXY (VIT D DEFICIENCY, FRACTURES): Vit D, 25-Hydroxy: 67 ng/mL (ref 30–100)

## 2014-10-24 ENCOUNTER — Other Ambulatory Visit: Payer: Self-pay | Admitting: *Deleted

## 2014-10-24 MED ORDER — GEMFIBROZIL 600 MG PO TABS: 600.0000 mg | ORAL_TABLET | Freq: Two times a day (BID) | ORAL | Status: DC

## 2015-01-18 ENCOUNTER — Ambulatory Visit (INDEPENDENT_AMBULATORY_CARE_PROVIDER_SITE_OTHER): Payer: Commercial Managed Care - HMO | Admitting: Physician Assistant

## 2015-01-18 ENCOUNTER — Encounter: Payer: Self-pay | Admitting: Physician Assistant

## 2015-01-18 VITALS — BP 120/70 | HR 65 | Temp 97.7°F | Resp 16 | Ht 68.5 in | Wt 159.0 lb

## 2015-01-18 DIAGNOSIS — G301 Alzheimer's disease with late onset: Secondary | ICD-10-CM

## 2015-01-18 DIAGNOSIS — G308 Other Alzheimer's disease: Secondary | ICD-10-CM | POA: Diagnosis not present

## 2015-01-18 DIAGNOSIS — F0280 Dementia in other diseases classified elsewhere without behavioral disturbance: Secondary | ICD-10-CM

## 2015-01-18 DIAGNOSIS — Z79899 Other long term (current) drug therapy: Secondary | ICD-10-CM | POA: Diagnosis not present

## 2015-01-18 DIAGNOSIS — E559 Vitamin D deficiency, unspecified: Secondary | ICD-10-CM

## 2015-01-18 DIAGNOSIS — M790 Rheumatism, unspecified: Secondary | ICD-10-CM

## 2015-01-18 DIAGNOSIS — E782 Mixed hyperlipidemia: Secondary | ICD-10-CM | POA: Diagnosis not present

## 2015-01-18 DIAGNOSIS — M797 Fibromyalgia: Secondary | ICD-10-CM

## 2015-01-18 DIAGNOSIS — I10 Essential (primary) hypertension: Secondary | ICD-10-CM

## 2015-01-18 LAB — CBC WITH DIFFERENTIAL/PLATELET
BASOS ABS: 0.1 10*3/uL (ref 0.0–0.1)
Basophils Relative: 1 % (ref 0–1)
Eosinophils Absolute: 0.3 10*3/uL (ref 0.0–0.7)
Eosinophils Relative: 4 % (ref 0–5)
HEMATOCRIT: 43.8 % (ref 39.0–52.0)
HEMOGLOBIN: 15 g/dL (ref 13.0–17.0)
LYMPHS PCT: 43 % (ref 12–46)
Lymphs Abs: 3.4 10*3/uL (ref 0.7–4.0)
MCH: 32.2 pg (ref 26.0–34.0)
MCHC: 34.2 g/dL (ref 30.0–36.0)
MCV: 94 fL (ref 78.0–100.0)
MONO ABS: 0.6 10*3/uL (ref 0.1–1.0)
MPV: 10.3 fL (ref 8.6–12.4)
Monocytes Relative: 7 % (ref 3–12)
NEUTROS ABS: 3.6 10*3/uL (ref 1.7–7.7)
NEUTROS PCT: 45 % (ref 43–77)
Platelets: 270 10*3/uL (ref 150–400)
RBC: 4.66 MIL/uL (ref 4.22–5.81)
RDW: 12.7 % (ref 11.5–15.5)
WBC: 8 10*3/uL (ref 4.0–10.5)

## 2015-01-18 LAB — LIPID PANEL
CHOL/HDL RATIO: 3.4 ratio (ref <=5.0)
Cholesterol: 178 mg/dL (ref 125–200)
HDL: 52 mg/dL (ref >=40)
LDL Cholesterol: 111 mg/dL (ref <130)
TRIGLYCERIDES: 74 mg/dL (ref <150)
VLDL: 15 mg/dL (ref <30)

## 2015-01-18 LAB — HEPATIC FUNCTION PANEL
ALK PHOS: 51 U/L (ref 40–115)
ALT: 10 U/L (ref 9–46)
AST: 17 U/L (ref 10–35)
Albumin: 4.4 g/dL (ref 3.6–5.1)
BILIRUBIN DIRECT: 0.2 mg/dL (ref <=0.2)
BILIRUBIN INDIRECT: 0.5 mg/dL (ref 0.2–1.2)
BILIRUBIN TOTAL: 0.7 mg/dL (ref 0.2–1.2)
Total Protein: 6.6 g/dL (ref 6.1–8.1)

## 2015-01-18 LAB — BASIC METABOLIC PANEL WITH GFR
BUN: 15 mg/dL (ref 7–25)
CHLORIDE: 98 mmol/L (ref 98–110)
CO2: 30 mmol/L (ref 20–31)
Calcium: 9.3 mg/dL (ref 8.6–10.3)
Creat: 0.85 mg/dL (ref 0.70–1.18)
GFR, EST NON AFRICAN AMERICAN: 86 mL/min (ref >=60)
GFR, Est African American: >89 mL/min (ref >=60)
GLUCOSE: 80 mg/dL (ref 65–99)
POTASSIUM: 4.5 mmol/L (ref 3.5–5.3)
Sodium: 135 mmol/L (ref 135–146)

## 2015-01-18 LAB — TSH: TSH: 0.909 u[IU]/mL (ref 0.350–4.500)

## 2015-01-18 LAB — MAGNESIUM: MAGNESIUM: 2 mg/dL (ref 1.5–2.5)

## 2015-01-18 NOTE — Progress Notes (Signed)
Assessment and Plan:  1. Hypertension -Continue medication, monitor blood pressure at home. Continue DASH diet.  Reminder to go to the ER if any CP, SOB, nausea, dizziness, severe HA, changes vision/speech, left arm numbness and tingling and jaw pain.  2. Cholesterol -Continue diet and exercise. Check cholesterol.   3. Prediabetes  -Continue diet and exercise. Check A1C  4. Vitamin D Def - check level and continue medications.   5. SDAT Declines medications at this time.   Continue diet and meds as discussed. Further disposition pending results of labs. Over 30 minutes of exam, counseling, chart review, and critical decision making was performed  HPI 74 y.o. male  presents for 3 month follow up on hypertension, cholesterol, prediabetes, and vitamin D deficiency.   His blood pressure has been controlled at home, today their BP is BP: 120/70 mmHg  He does workout. He denies chest pain, shortness of breath, dizziness.  He is on cholesterol medication and denies myalgias. His cholesterol is at goal. The cholesterol last visit was:   Lab Results  Component Value Date   CHOL 193 10/19/2014   HDL 44 10/19/2014   LDLCALC 124 10/19/2014   TRIG 126 10/19/2014   CHOLHDL 4.4 10/19/2014   Last U9W in the office was:  Lab Results  Component Value Date   HGBA1C 5.6 10/19/2014   Patient is on Vitamin D supplement.   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  Wife is here with him and states that he has constant memory decline, has been getting progressively worse, short term is worse, he does not cook anymore. Has had some imbalance, better if he stands for a while before walking.   Current Medications:  Current Outpatient Prescriptions on File Prior to Visit  Medication Sig Dispense Refill  . Apoaequorin (PREVAGEN PO) Take by mouth daily.    . B Complex-C  (SUPER B COMPLEX PO) Take by mouth daily.    . Calcium-Magnesium-Vitamin D (CALCIUM MAGNESIUM PO) Take 1 tablet by mouth daily.    . Cholecalciferol (VITAMIN D) 2000 UNITS tablet Take 1 tablet (2,000 Units total) by mouth daily.    Marland Kitchen gemfibrozil (LOPID) 600 MG tablet Take 1 tablet (600 mg total) by mouth 2 (two) times daily before a meal. 60 tablet 11  . latanoprost (XALATAN) 0.005 % ophthalmic solution Place 1 drop into both eyes at bedtime.     . Omega-3 Fatty Acids (FISH OIL) 1000 MG CAPS Take 1 capsule (1,000 mg total) by mouth 2 (two) times daily.  0   No current facility-administered medications on file prior to visit.   Medical History:  Past Medical History  Diagnosis Date  . Vitamin D deficiency   . OSA (obstructive sleep apnea)   . RLS (restless legs syndrome)   . Glaucoma    Allergies:  Allergies  Allergen Reactions  . Benadryl [Diphenhydramine Hcl] Other (See Comments)    Hallucinations, itching, shaking     Review of Systems:  Review of Systems  Constitutional: Negative for fever, chills and malaise/fatigue.  HENT: Negative for congestion, ear discharge, nosebleeds and sore throat.   Eyes: Negative.   Respiratory: Negative for cough, shortness of breath and wheezing.   Cardiovascular: Negative for chest pain, palpitations and leg swelling.  Gastrointestinal: Negative for heartburn, nausea, abdominal pain, diarrhea, constipation, blood in stool  and melena.  Genitourinary: Negative.   Skin: Negative.   Neurological: Negative for dizziness, sensory change, loss of consciousness and headaches.  Psychiatric/Behavioral: Positive for memory loss. Negative for depression and suicidal ideas. The patient is not nervous/anxious.     Family history- Review and unchanged Social history- Review and unchanged Physical Exam: BP 120/70 mmHg  Pulse 65  Temp(Src) 97.7 F (36.5 C) (Temporal)  Resp 16  Ht 5' 8.5" (1.74 m)  Wt 159 lb (72.122 kg)  BMI 23.82 kg/m2  SpO2 93% Wt  Readings from Last 3 Encounters:  01/18/15 159 lb (72.122 kg)  10/19/14 162 lb (73.483 kg)  07/15/14 166 lb (75.297 kg)   General Appearance: Well nourished, in no apparent distress. Eyes: PERRLA, EOMs, conjunctiva no swelling or erythema Sinuses: No Frontal/maxillary tenderness ENT/Mouth: Ext aud canals clear, TMs without erythema, bulging. No erythema, swelling, or exudate on post pharynx.  Tonsils not swollen or erythematous. Hearing normal.  Neck: Supple, thyroid normal.  Respiratory: Respiratory effort normal, BS equal bilaterally without rales, rhonchi, wheezing or stridor.  Cardio: RRR with no MRGs. Brisk peripheral pulses without edema.  Abdomen: Soft, + BS,  Non tender, no guarding, rebound, hernias, masses. Lymphatics: Non tender without lymphadenopathy.  Musculoskeletal: Full ROM, 5/5 strength, slow unsteady gait Skin: Warm, dry without rashes, lesions, ecchymosis.  Neuro: Cranial nerves intact. Normal muscle tone, no cerebellar symptoms. Psych: Awake and oriented X 3, normal affect, Insight and Judgment appropriate.    Quentin MullingAmanda Tywone Bembenek, PA-C 11:10 AM [facility]

## 2015-01-19 ENCOUNTER — Encounter: Payer: Self-pay | Admitting: Internal Medicine

## 2015-01-19 LAB — VITAMIN D 25 HYDROXY (VIT D DEFICIENCY, FRACTURES): Vit D, 25-Hydroxy: 92 ng/mL (ref 30–100)

## 2015-04-20 ENCOUNTER — Encounter: Payer: Self-pay | Admitting: Internal Medicine

## 2015-04-20 ENCOUNTER — Ambulatory Visit (INDEPENDENT_AMBULATORY_CARE_PROVIDER_SITE_OTHER): Payer: PPO | Admitting: Internal Medicine

## 2015-04-20 VITALS — BP 120/84 | HR 64 | Temp 97.1°F | Resp 16 | Ht 68.75 in | Wt 155.8 lb

## 2015-04-20 DIAGNOSIS — E782 Mixed hyperlipidemia: Secondary | ICD-10-CM

## 2015-04-20 DIAGNOSIS — F0280 Dementia in other diseases classified elsewhere without behavioral disturbance: Secondary | ICD-10-CM

## 2015-04-20 DIAGNOSIS — G4733 Obstructive sleep apnea (adult) (pediatric): Secondary | ICD-10-CM

## 2015-04-20 DIAGNOSIS — Z0000 Encounter for general adult medical examination without abnormal findings: Secondary | ICD-10-CM | POA: Diagnosis not present

## 2015-04-20 DIAGNOSIS — G301 Alzheimer's disease with late onset: Secondary | ICD-10-CM

## 2015-04-20 DIAGNOSIS — Z79899 Other long term (current) drug therapy: Secondary | ICD-10-CM

## 2015-04-20 DIAGNOSIS — E559 Vitamin D deficiency, unspecified: Secondary | ICD-10-CM

## 2015-04-20 DIAGNOSIS — Z0001 Encounter for general adult medical examination with abnormal findings: Secondary | ICD-10-CM

## 2015-04-20 DIAGNOSIS — I10 Essential (primary) hypertension: Secondary | ICD-10-CM

## 2015-04-20 DIAGNOSIS — R7303 Prediabetes: Secondary | ICD-10-CM

## 2015-04-20 DIAGNOSIS — Z1212 Encounter for screening for malignant neoplasm of rectum: Secondary | ICD-10-CM

## 2015-04-20 DIAGNOSIS — Z125 Encounter for screening for malignant neoplasm of prostate: Secondary | ICD-10-CM

## 2015-04-20 LAB — CBC WITH DIFFERENTIAL/PLATELET
BASOS ABS: 0.1 10*3/uL (ref 0.0–0.1)
BASOS PCT: 1 % (ref 0–1)
EOS ABS: 0.3 10*3/uL (ref 0.0–0.7)
EOS PCT: 5 % (ref 0–5)
HCT: 43.4 % (ref 39.0–52.0)
Hemoglobin: 15.4 g/dL (ref 13.0–17.0)
Lymphocytes Relative: 43 % (ref 12–46)
Lymphs Abs: 3 10*3/uL (ref 0.7–4.0)
MCH: 33.6 pg (ref 26.0–34.0)
MCHC: 35.5 g/dL (ref 30.0–36.0)
MCV: 94.8 fL (ref 78.0–100.0)
MPV: 10 fL (ref 8.6–12.4)
Monocytes Absolute: 0.6 10*3/uL (ref 0.1–1.0)
Monocytes Relative: 8 % (ref 3–12)
NEUTROS PCT: 43 % (ref 43–77)
Neutro Abs: 3 10*3/uL (ref 1.7–7.7)
PLATELETS: 242 10*3/uL (ref 150–400)
RBC: 4.58 MIL/uL (ref 4.22–5.81)
RDW: 12.5 % (ref 11.5–15.5)
WBC: 6.9 10*3/uL (ref 4.0–10.5)

## 2015-04-20 LAB — HEPATIC FUNCTION PANEL
ALBUMIN: 4.3 g/dL (ref 3.6–5.1)
ALK PHOS: 45 U/L (ref 40–115)
ALT: 8 U/L — ABNORMAL LOW (ref 9–46)
AST: 14 U/L (ref 10–35)
BILIRUBIN DIRECT: 0.1 mg/dL (ref <=0.2)
BILIRUBIN INDIRECT: 0.6 mg/dL (ref 0.2–1.2)
BILIRUBIN TOTAL: 0.7 mg/dL (ref 0.2–1.2)
Total Protein: 6.8 g/dL (ref 6.1–8.1)

## 2015-04-20 LAB — LIPID PANEL
CHOL/HDL RATIO: 3.5 ratio (ref <=5.0)
Cholesterol: 195 mg/dL (ref 125–200)
HDL: 56 mg/dL (ref >=40)
LDL CALC: 127 mg/dL (ref <130)
TRIGLYCERIDES: 61 mg/dL (ref <150)
VLDL: 12 mg/dL (ref <30)

## 2015-04-20 LAB — TSH: TSH: 0.79 mIU/L (ref 0.40–4.50)

## 2015-04-20 LAB — BASIC METABOLIC PANEL WITH GFR
BUN: 15 mg/dL (ref 7–25)
CO2: 28 mmol/L (ref 20–31)
Calcium: 9.5 mg/dL (ref 8.6–10.3)
Chloride: 98 mmol/L (ref 98–110)
Creat: 0.91 mg/dL (ref 0.70–1.18)
GFR, EST NON AFRICAN AMERICAN: 83 mL/min (ref >=60)
Glucose, Bld: 98 mg/dL (ref 65–99)
POTASSIUM: 4.3 mmol/L (ref 3.5–5.3)
SODIUM: 137 mmol/L (ref 135–146)

## 2015-04-20 LAB — MAGNESIUM: Magnesium: 1.9 mg/dL (ref 1.5–2.5)

## 2015-04-20 NOTE — Progress Notes (Signed)
Patient ID: Cameron Mcclain, male   DOB: 07/18/1940, 75 y.o.   MRN: [id number]  Annual  Screening/Preventative Visit And Comprehensive Evaluation & Examination  This very nice 75 y.o. MWM presents for a Wellness/Preventative Visit & comprehensive evaluation and management of multiple medical co-morbidities.  Patient has been followed for HTN, Prediabetes, Hyperlipidemia and Vitamin D Deficiency. Patient also has SDAT and has had Neurology consultation and patient's wife has stopped his Aricept & Namenda for concern that his memory worsened. Other problems include OSA & RLS.    HTN predates since 1999. Patient's BP has been controlled at home.Today's BP: 120/84 mmHg. Patient denies any cardiac symptoms as chest pain, palpitations, shortness of breath, dizziness or ankle swelling.   Patient's hyperlipidemia is not controlled with diet and and his wife has discontinued his cholesterol meds(statins) for concern that they might affect his memory. Last lipids were not at goal Cholesterol 178; HDL 52; LDL 111; Triglycerides 74 on 01/18/2015.   Patient has prediabetes since 2011 with A1c 5.8% and patient denies reactive hypoglycemic symptoms, visual blurring, diabetic polys or paresthesias. Last A1c was 5.6% on 10/19/2014.    Finally, patient has history of Vitamin D Deficiency of of "24" in 2008 and last vitamin D was 92 on 01/18/2015.    Medication Sig  . Apoaequorin (PREVAGEN PO) Take by mouth daily.  . SUPER B COMPLEX  Take by mouth daily.  . Calcium-Magnesium-Vitamin D  Take 1 tablet by mouth daily.  Marland Kitchen VITAMIN D 2000 UNITS tablet Take 1 tablet (2,000 Units total) by mouth daily.  Marland Kitchen gemfibrozil  600 MG tablet Take 1 tablet (600 mg total) by mouth 2 (two) times daily before a meal.  . XALATAN ophth soln Place 1 drop into both eyes at bedtime.   . Omega-3 FISH OIL 1000 MG  Take 1 capsule (1,000 mg total) by mouth 2 (two) times daily.   Allergies  Allergen Reactions  . Benadryl [Diphenhydramine Hcl]  Other (See Comments)    Hallucinations, itching, shaking   Past Medical History  Diagnosis Date  . Vitamin D deficiency   . OSA (obstructive sleep apnea)   . RLS (restless legs syndrome)   . Glaucoma    Health Maintenance  Topic Date Due  . INFLUENZA VACCINE  09/05/2015  . COLONOSCOPY  10/05/2016  . TETANUS/TDAP  09/25/2020  . ZOSTAVAX  Completed  . PNA vac Low Risk Adult  Completed   Immunization History  Administered Date(s) Administered  . Influenza Whole 11/23/2012  . Influenza, High Dose Seasonal PF 11/23/2013, 10/19/2014  . Pneumococcal Conjugate-13 01/12/2014  . Pneumococcal Polysaccharide-23 12/02/2008  . Td 09/26/2010  . Zoster 02/25/2008   Past Surgical History  Procedure Laterality Date  . Pilonidal cyst / sinus excision  1959  . Axillary Right 1986    biopsy of axillary nodes   Family History  Problem Relation Age of Onset  . Hypertension Mother   . Diabetes Mother   . Hypertension Father   . Cancer Father     Social History   Social History  . Marital Status: Married    Spouse Name: N/A  . Number of Children: N/A  . Years of Education: N/A   Occupational History  . Retired Restaurant manager, fast food   Social History Main Topics  . Smoking status: Former Smoker    Quit date: 05/10/2009  . Smokeless tobacco: Not on file  . Alcohol Use: 0.0 oz/week    0 Standard drinks or equivalent per week  . Drug  Use: No  . Sexual Activity: Not on file    ROS Constitutional: Denies fever, chills, weight loss/gain, headaches, insomnia,  night sweats or change in appetite. Does c/o fatigue. Eyes: Denies redness, blurred vision, diplopia, discharge, itchy or watery eyes.  ENT: Denies discharge, congestion, post nasal drip, epistaxis, sore throat, earache, hearing loss, dental pain, Tinnitus, Vertigo, Sinus pain or snoring.  Cardio: Denies chest pain, palpitations, irregular heartbeat, syncope, dyspnea, diaphoresis, orthopnea, PND, claudication or edema Respiratory:  denies cough, dyspnea, DOE, pleurisy, hoarseness, laryngitis or wheezing.  Gastrointestinal: Denies dysphagia, heartburn, reflux, water brash, pain, cramps, nausea, vomiting, bloating, diarrhea, constipation, hematemesis, melena, hematochezia, jaundice or hemorrhoids Genitourinary: Denies dysuria, frequency, urgency, nocturia, hesitancy, discharge, hematuria or flank pain Musculoskeletal: Denies arthralgia, myalgia, stiffness, Jt. Swelling, pain, limp or strain/sprain. Denies Falls. Skin: Denies puritis, rash, hives, warts, acne, eczema or change in skin lesion Neuro: No weakness, tremor, incoordination, spasms, paresthesia or pain Psychiatric: Denies confusion, memory loss or sensory loss. Denies Depression. Endocrine: Denies change in weight, skin, hair change, nocturia, and paresthesia, diabetic polys, visual blurring or hyper / hypo glycemic episodes.  Heme/Lymph: No excessive bleeding, bruising or enlarged lymph nodes.  Physical Exam  BP 120/84 mmHg  Pulse 64  Temp(Src) 97.1 F (36.2 C)  Resp 16  Ht 5' 8.75" (1.746 m)  Wt 155 lb 12.8 oz (70.67 kg)  BMI 23.18 kg/m2  General Appearance: Well nourished, in no apparent distress. Eyes: PERRLA, EOMs, conjunctiva no swelling or erythema, normal fundi and vessels. Sinuses: No frontal/maxillary tenderness ENT/Mouth: EACs patent / TMs  nl. Nares clear without erythema, swelling, mucoid exudates. Oral hygiene is good. No erythema, swelling, or exudate. Tongue normal, non-obstructing. Tonsils not swollen or erythematous. Hearing normal.  Neck: Supple, thyroid normal. No bruits, nodes or JVD. Respiratory: Respiratory effort normal.  BS equal and clear bilateral without rales, rhonci, wheezing or stridor. Cardio: Heart sounds are normal with regular rate and rhythm and no murmurs, rubs or gallops. Peripheral pulses are normal and equal bilaterally without edema. No aortic or femoral bruits. Chest: symmetric with normal excursions and percussion.   Abdomen: Soft, with Nl bowel sounds. Nontender, no guarding, rebound, hernias, masses, or organomegaly.  Lymphatics: Non tender without lymphadenopathy.  Genitourinary: No hernias.Testes nl. DRE - prostate nl for age - smooth & firm w/o nodules. Musculoskeletal: Full ROM all peripheral extremities, joint stability, 5/5 strength, and normal gait. Skin: Warm and dry without rashes, lesions, cyanosis, clubbing or  ecchymosis.  Neuro: Cranial nerves intact, reflexes equal bilaterally. Normal muscle tone, no cerebellar symptoms. Sensation intact.  Pysch: Alert and oriented X 3 with normal affect, insight and judgment appropriate.   Assessment and Plan  1. Annual Preventative/Screening Exam    2. Essential hypertension  - Microalbumin / creatinine urine ratio - EKG 12-Lead - [place], RETROPERITNL ABD,  LTD - TSH  3. Hyperlipidemia  - Lipid panel - TSH  4. Prediabetes  - Hemoglobin A1c - Insulin, random  5. Vitamin D deficiency  - VITAMIN D 25 Hydroxy  6. OSA (obstructive sleep apnea)   7. SDAT (senile dementia of Alzheimer's type)   8. Screening for rectal cancer  - POC Hemoccult Bld/Stl   9. Prostate cancer screening  - PSA  10. Medication management  - Urinalysis, Routine w reflex microscopic  - CBC with Differential/Platelet - BASIC METABOLIC PANEL WITH GFR - Hepatic function panel - Magnesium   Continue prudent diet as discussed, weight control, BP monitoring, regular exercise, and medications as discussed.  Discussed med  effects and SE's. Routine screening labs and tests as requested with regular follow-up as recommended. Over 40 minutes of exam, counseling, chart review and high complex critical decision making was performed

## 2015-04-20 NOTE — Patient Instructions (Signed)

## 2015-04-21 ENCOUNTER — Encounter: Payer: Self-pay | Admitting: Internal Medicine

## 2015-04-21 ENCOUNTER — Other Ambulatory Visit: Payer: Self-pay | Admitting: Internal Medicine

## 2015-04-21 LAB — INSULIN, RANDOM: INSULIN: 12.5 u[IU]/mL (ref 2.0–19.6)

## 2015-04-21 LAB — MICROALBUMIN / CREATININE URINE RATIO
CREATININE, URINE: 41 mg/dL (ref 20–370)
MICROALB UR: 0.2 mg/dL
MICROALB/CREAT RATIO: 5 ug/mg{creat} (ref <30)

## 2015-04-21 LAB — HEMOGLOBIN A1C
Hgb A1c MFr Bld: 5.3 % (ref <5.7)
Mean Plasma Glucose: 105 mg/dL (ref <117)

## 2015-04-21 LAB — URINALYSIS, ROUTINE W REFLEX MICROSCOPIC
BILIRUBIN URINE: NEGATIVE
GLUCOSE, UA: NEGATIVE
HGB URINE DIPSTICK: NEGATIVE
KETONES UR: NEGATIVE
LEUKOCYTES UA: NEGATIVE
Nitrite: NEGATIVE
PH: 7 (ref 5.0–8.0)
PROTEIN: NEGATIVE
Specific Gravity, Urine: 1.012 (ref 1.001–1.035)

## 2015-04-21 LAB — VITAMIN D 25 HYDROXY (VIT D DEFICIENCY, FRACTURES): Vit D, 25-Hydroxy: 92 ng/mL (ref 30–100)

## 2015-04-21 LAB — PSA: PSA: 3.9 ng/mL (ref <=4.00)

## 2015-07-25 ENCOUNTER — Encounter: Payer: Self-pay | Admitting: Internal Medicine

## 2015-07-25 ENCOUNTER — Ambulatory Visit (INDEPENDENT_AMBULATORY_CARE_PROVIDER_SITE_OTHER): Payer: PPO | Admitting: Internal Medicine

## 2015-07-25 VITALS — BP 122/70 | HR 64 | Temp 98.0°F | Resp 14 | Ht 68.75 in | Wt 152.0 lb

## 2015-07-25 DIAGNOSIS — M790 Rheumatism, unspecified: Secondary | ICD-10-CM

## 2015-07-25 DIAGNOSIS — E782 Mixed hyperlipidemia: Secondary | ICD-10-CM

## 2015-07-25 DIAGNOSIS — I10 Essential (primary) hypertension: Secondary | ICD-10-CM | POA: Diagnosis not present

## 2015-07-25 DIAGNOSIS — E559 Vitamin D deficiency, unspecified: Secondary | ICD-10-CM | POA: Diagnosis not present

## 2015-07-25 DIAGNOSIS — Z79899 Other long term (current) drug therapy: Secondary | ICD-10-CM | POA: Diagnosis not present

## 2015-07-25 DIAGNOSIS — R7303 Prediabetes: Secondary | ICD-10-CM

## 2015-07-25 DIAGNOSIS — R6889 Other general symptoms and signs: Secondary | ICD-10-CM

## 2015-07-25 DIAGNOSIS — G301 Alzheimer's disease with late onset: Secondary | ICD-10-CM

## 2015-07-25 DIAGNOSIS — Z0001 Encounter for general adult medical examination with abnormal findings: Secondary | ICD-10-CM

## 2015-07-25 DIAGNOSIS — G308 Other Alzheimer's disease: Secondary | ICD-10-CM | POA: Diagnosis not present

## 2015-07-25 DIAGNOSIS — H409 Unspecified glaucoma: Secondary | ICD-10-CM | POA: Diagnosis not present

## 2015-07-25 DIAGNOSIS — M797 Fibromyalgia: Secondary | ICD-10-CM

## 2015-07-25 DIAGNOSIS — G4733 Obstructive sleep apnea (adult) (pediatric): Secondary | ICD-10-CM | POA: Diagnosis not present

## 2015-07-25 DIAGNOSIS — F0280 Dementia in other diseases classified elsewhere without behavioral disturbance: Secondary | ICD-10-CM

## 2015-07-25 DIAGNOSIS — Z6824 Body mass index (BMI) 24.0-24.9, adult: Secondary | ICD-10-CM

## 2015-07-25 DIAGNOSIS — Z0000 Encounter for general adult medical examination without abnormal findings: Secondary | ICD-10-CM

## 2015-07-25 LAB — HEPATIC FUNCTION PANEL
ALT: 8 U/L — AB (ref 9–46)
AST: 13 U/L (ref 10–35)
Albumin: 4.5 g/dL (ref 3.6–5.1)
Alkaline Phosphatase: 51 U/L (ref 40–115)
BILIRUBIN DIRECT: 0.1 mg/dL (ref <=0.2)
BILIRUBIN INDIRECT: 0.7 mg/dL (ref 0.2–1.2)
Total Bilirubin: 0.8 mg/dL (ref 0.2–1.2)
Total Protein: 6.8 g/dL (ref 6.1–8.1)

## 2015-07-25 LAB — CBC WITH DIFFERENTIAL/PLATELET
BASOS PCT: 1 %
Basophils Absolute: 79 cells/uL (ref 0–200)
EOS PCT: 4 %
Eosinophils Absolute: 316 cells/uL (ref 15–500)
HCT: 43.4 % (ref 38.5–50.0)
HEMOGLOBIN: 15 g/dL (ref 13.2–17.1)
LYMPHS ABS: 3634 {cells}/uL (ref 850–3900)
LYMPHS PCT: 46 %
MCH: 32.8 pg (ref 27.0–33.0)
MCHC: 34.6 g/dL (ref 32.0–36.0)
MCV: 95 fL (ref 80.0–100.0)
MPV: 10 fL (ref 7.5–12.5)
Monocytes Absolute: 632 cells/uL (ref 200–950)
Monocytes Relative: 8 %
NEUTROS PCT: 41 %
Neutro Abs: 3239 cells/uL (ref 1500–7800)
Platelets: 263 10*3/uL (ref 140–400)
RBC: 4.57 MIL/uL (ref 4.20–5.80)
RDW: 12.7 % (ref 11.0–15.0)
WBC: 7.9 10*3/uL (ref 3.8–10.8)

## 2015-07-25 LAB — LIPID PANEL
CHOLESTEROL: 202 mg/dL — AB (ref 125–200)
HDL: 63 mg/dL (ref >=40)
LDL Cholesterol: 124 mg/dL (ref <130)
Total CHOL/HDL Ratio: 3.2 Ratio (ref <=5.0)
Triglycerides: 74 mg/dL (ref <150)
VLDL: 15 mg/dL (ref <30)

## 2015-07-25 MED ORDER — GEMFIBROZIL 600 MG PO TABS: 600.0000 mg | ORAL_TABLET | Freq: Two times a day (BID) | ORAL | Status: DC

## 2015-07-25 NOTE — Progress Notes (Signed)
MEDICARE ANNUAL WELLNESS VISIT AND FOLLOW UP Assessment:    1. Essential hypertension -cont to monitor -no medications needed -dash diet -exercise as tolerated  2. OSA (obstructive sleep apnea) -weight loss has improved OSA  3. SDAT (senile dementia of Alzheimer's type) -discontinue prevagen -continue word puzzles  4. Fibromyalgia -cont exercise as tolerated  5. Hyperlipidemia -cont gemfibrozil - Lipid panel  6. Prediabetes -cont diet and exercise  7. Vitamin D deficiency -cont vit d def  8. Medication management  - CBC with Differential/Platelet - BASIC METABOLIC PANEL WITH GFR - Hepatic function panel  9. Glaucoma -cont maintenance visits with Dr. Emily FilbertGould  10. Body mass index (BMI) of 24.0-24.9 in adult -cont diet an exercise as tolerated  11. Routine general medical examination at a health care facility -due next year.    Over 30 minutes of exam, counseling, chart review, and critical decision making was performed  Future Appointments Date Time Provider Department Center  10/26/2015 10:30 AM Lucky CowboyWilliam McKeown, MD [facility] None  05/27/2016 10:00 AM Lucky CowboyWilliam McKeown, MD [facility] None     Plan:   During the course of the visit the patient was educated and counseled about appropriate screening and preventive services including:    Pneumococcal vaccine   Influenza vaccine  Prevnar 13  Td vaccine  Screening electrocardiogram  Colorectal cancer screening  Diabetes screening  Glaucoma screening  Nutrition counseling   Conditions/risks identified: BMI: Discussed weight loss, diet, and increase physical activity.  Increase physical activity: AHA recommends 150 minutes of physical activity a week.  Medications reviewed Diabetes is at goal, ACE/ARB therapy: No, Reason not on Ace Inhibitor/ARB therapy:  not indicated due to hypotension Urinary Incontinence is not an issue: discussed non pharmacology and pharmacology options.  Fall risk: low-  discussed PT, home fall assessment, medications.    Subjective:  Cameron Mcclain is a 75 y.o. male who presents for Medicare Annual Wellness Visit and 3 month follow up for HTN, hyperlipidemia, prediabetes, and vitamin D Def.   His blood pressure has been controlled at home, today their BP is BP: 122/70 mmHg He does workout. He is walking with his cat in his yard and also push mows his yard.   He denies chest pain, shortness of breath, dizziness.   He is on cholesterol medication and denies myalgias. His cholesterol is at goal. The cholesterol last visit was:  Lab Results  Component Value Date   CHOL 195 04/20/2015   HDL 56 04/20/2015   LDLCALC 127 04/20/2015   TRIG 61 04/20/2015   CHOLHDL 3.5 04/20/2015   He is working on diet and exercise.  He has had prediabetes in the past, but has managed this well with diet and exercise. Lab Results  Component Value Date   HGBA1C 5.3 04/20/2015    Last GFR Lab Results  Component Value Date   GFRNONAA 83 04/20/2015     Lab Results  Component Value Date   GFRAA >89 04/20/2015   Patient is on Vitamin D supplement.   Lab Results  Component Value Date   VD25OH 1392 04/20/2015     He notes that he has been having a hard time with post nasal dripping.  He is not currently taking anything for this.  His wife reports that his memory has been stable.  They do not see any effect of the prevagen that he is taking.    Medication Review: Current Outpatient Prescriptions on File Prior to Visit  Medication Sig Dispense Refill  . Apoaequorin (  PREVAGEN PO) Take by mouth daily.    . B Complex-C (SUPER B COMPLEX PO) Take by mouth daily.    . Calcium-Magnesium-Vitamin D (CALCIUM MAGNESIUM PO) Take 1 tablet by mouth daily.    . Cholecalciferol (VITAMIN D) 2000 UNITS tablet Take 1 tablet (2,000 Units total) by mouth daily.    Marland Kitchen gemfibrozil (LOPID) 600 MG tablet Take 1 tablet (600 mg total) by mouth 2 (two) times daily before a meal. 60 tablet 11  .  latanoprost (XALATAN) 0.005 % ophthalmic solution Place 1 drop into both eyes at bedtime.     . Omega-3 Fatty Acids (FISH OIL) 1000 MG CAPS Take 1 capsule (1,000 mg total) by mouth 2 (two) times daily.  0   No current facility-administered medications on file prior to visit.    Allergies: Allergies  Allergen Reactions  . Benadryl [Diphenhydramine Hcl] Other (See Comments)    Hallucinations, itching, shaking    Current Problems (verified) Patient Active Problem List   Diagnosis Date Noted  . Glaucoma 10/19/2014  . OSA (obstructive sleep apnea) 10/19/2014  . Body mass index (BMI) of 24.0-24.9 in adult 10/19/2014  . Medication management 07/01/2013  . Essential hypertension 12/29/2012  . Hyperlipidemia 12/29/2012  . Prediabetes 12/29/2012  . Vitamin D deficiency 12/29/2012  . SDAT (senile dementia of Alzheimer's type) 12/29/2012  . Fibromyalgia 12/29/2012    Screening Tests Immunization History  Administered Date(s) Administered  . Influenza Whole 11/23/2012  . Influenza, High Dose Seasonal PF 11/23/2013, 10/19/2014  . Pneumococcal Conjugate-13 01/12/2014  . Pneumococcal Polysaccharide-23 12/02/2008  . Td 09/26/2010  . Zoster 02/25/2008    Preventative care: Last colonoscopy: 2012  Prior vaccinations: TD or Tdap: 2012  Influenza: 2016  Pneumococcal: 2010 Prevnar13: 2015 Shingles/Zostavax: 2010  Names of Other Physician/Practitioners you currently use: 1. [facility] here for primary care 2. Dr. Emily Filbert, eye doctor, last visit 2016, due  3. Patient cannot recall, dentist, last visit  Patient Care Team: Lucky Cowboy, MD as PCP - General (Internal Medicine)  Surgical: Past Surgical History  Procedure Laterality Date  . Pilonidal cyst / sinus excision  1959  . Axillary Right 1986    biopsy of axillary nodes   Family  Patient's family history includes Cancer in his father; Diabetes in his mother; Hypertension in his father  and mother. Social history  He reports that he quit smoking about 6 years ago. He does not have any smokeless tobacco history on file. He reports that he drinks alcohol. He reports that he does not use illicit drugs.  MEDICARE WELLNESS OBJECTIVES: Tobacco use: He does not smoke.  Patient is not a former smoker. If yes, counseling given Alcohol Current alcohol use: none Diet: well balanced Physical activity:   Cardiac risk factors:   Depression/mood screen:   Depression screen [facility] 2/9 04/20/2015  Decreased Interest 0  Down, Depressed, Hopeless 0  PHQ - 2 Score 0    ADLs:  In your present state of health, do you have any difficulty performing the following activities: 04/20/2015  Hearing? N  Vision? N  Difficulty concentrating or making decisions? Y  Walking or climbing stairs? N  Dressing or bathing? N  Doing errands, shopping? Y     Cognitive Testing  Alert? Yes  Normal Appearance?Yes  Oriented to person? Yes  Place? Yes   Time? Yes  Recall of three objects?  Yes  Can perform simple calculations? Yes  Displays appropriate judgment?Yes  Can read the correct time from  a watch face?Yes  EOL planning:     Objective:   Today's Vitals   07/25/15 1028  BP: 122/70  Pulse: 64  Temp: 98 F (36.7 C)  TempSrc: Temporal  Resp: 14  Height: 5' 8.75" (1.746 m)  Weight: 152 lb (68.947 kg)   Body mass index is 22.62 kg/(m^2).  General appearance: alert, no distress, WD/WN, male HEENT: normocephalic, sclerae anicteric, TMs pearly, nares patent, no discharge or erythema, pharynx normal Oral cavity: MMM, no lesions Neck: supple, no lymphadenopathy, no thyromegaly, no masses Heart: RRR, normal S1, S2, no murmurs Lungs: CTA bilaterally, no wheezes, rhonchi, or rales Abdomen: +bs, soft, non tender, non distended, no masses, no hepatomegaly, no splenomegaly Musculoskeletal: nontender, no swelling, no obvious deformity Extremities: no edema, no cyanosis, no clubbing Pulses: 2+  symmetric, upper and lower extremities, normal cap refill Neurological: alert, oriented x 3, CN2-12 intact, strength normal upper extremities and lower extremities, sensation normal throughout, DTRs 2+ throughout, no cerebellar signs, gait normal Psychiatric: normal affect, behavior normal, pleasant   Medicare Attestation I have personally reviewed: The patient's medical and social history Their use of alcohol, tobacco or illicit drugs Their current medications and supplements The patient's functional ability including ADLs,fall risks, home safety risks, cognitive, and hearing and visual impairment Diet and physical activities Evidence for depression or mood disorders  The patient's weight, height, BMI, and visual acuity have been recorded in the chart.  I have made referrals, counseling, and provided education to the patient based on review of the above and I have provided the patient with a written personalized care plan for preventive services.     Terri Piedra, PA-C   07/25/2015

## 2015-09-11 ENCOUNTER — Ambulatory Visit (INDEPENDENT_AMBULATORY_CARE_PROVIDER_SITE_OTHER): Payer: PPO | Admitting: Internal Medicine

## 2015-09-11 VITALS — BP 116/76 | HR 87 | Temp 97.9°F | Resp 16

## 2015-09-11 DIAGNOSIS — L720 Epidermal cyst: Secondary | ICD-10-CM

## 2015-09-11 MED ORDER — DOXYCYCLINE HYCLATE 100 MG PO CAPS: 100.0000 mg | ORAL_CAPSULE | Freq: Two times a day (BID) | ORAL | 0 refills | Status: DC

## 2015-09-11 NOTE — Patient Instructions (Signed)
Epidermal Cyst An epidermal cyst is sometimes called a sebaceous cyst, epidermal inclusion cyst, or infundibular cyst. These cysts usually contain a substance that looks "pasty" or "cheesy" and may have a bad smell. This substance is a protein called keratin. Epidermal cysts are usually found on the face, neck, or trunk. They may also occur in the vaginal area or other parts of the genitalia of both men and women. Epidermal cysts are usually small, painless, slow-growing bumps or lumps that move freely under the skin. It is important not to try to pop them. This may cause an infection and lead to tenderness and swelling. CAUSES  Epidermal cysts may be caused by a deep penetrating injury to the skin or a plugged hair follicle, often associated with acne. SYMPTOMS  Epidermal cysts can become inflamed and cause:  Redness.  Tenderness.  Increased temperature of the skin over the bumps or lumps.  Grayish-white, bad smelling material that drains from the bump or lump. DIAGNOSIS  Epidermal cysts are easily diagnosed by your caregiver during an exam. Rarely, a tissue sample (biopsy) may be taken to rule out other conditions that may resemble epidermal cysts. TREATMENT   Epidermal cysts often get better and disappear on their own. They are rarely ever cancerous.  If a cyst becomes infected, it may become inflamed and tender. This may require opening and draining the cyst. Treatment with antibiotics may be necessary. When the infection is gone, the cyst may be removed with minor surgery.  Small, inflamed cysts can often be treated with antibiotics or by injecting steroid medicines.  Sometimes, epidermal cysts become large and bothersome. If this happens, surgical removal in your caregiver's office may be necessary. HOME CARE INSTRUCTIONS  Only take over-the-counter or prescription medicines as directed by your caregiver.  Take your antibiotics as directed. Finish them even if you start to feel  better. SEEK MEDICAL CARE IF:   Your cyst becomes tender, red, or swollen.  Your condition is not improving or is getting worse.  You have any other questions or concerns. MAKE SURE YOU:  Understand these instructions.  Will watch your condition.  Will get help right away if you are not doing well or get worse.   This information is not intended to replace advice given to you by your health care provider. Make sure you discuss any questions you have with your health care provider.   Document Released: 12/23/2003 Document Revised: 04/15/2011 Document Reviewed: 07/30/2010 Elsevier Interactive Patient Education 2016 [employer]vier Inc.  

## 2015-09-14 ENCOUNTER — Encounter: Payer: Self-pay | Admitting: Internal Medicine

## 2015-09-14 NOTE — Progress Notes (Signed)
   Subjective:    Patient ID: Barry Dienesobert V Freiman, male    DOB: Apr 19, 1940, 75 y.o.   MRN: [id number]  HPI  Patient presents for two tender bumps on his back which has been getting bigger for the last 6 months.  Sometimes they drain white thick stuff when his wife pushes on them.  NO redness, fever, chills, nausea or vomiting.  Review of Systems  Constitutional: Negative for chills, fatigue and fever.  Skin: Positive for color change and wound.       Objective:   Physical Exam  Constitutional: He appears well-developed and well-nourished. No distress.  HENT:  Head: Normocephalic.  Mouth/Throat: Oropharynx is clear and moist. No oropharyngeal exudate.  Eyes: Conjunctivae are normal. No scleral icterus.  Neck: Normal range of motion. Neck supple. No JVD present. No thyromegaly present.  Lymphadenopathy:    He has no cervical adenopathy.  Skin: Skin is warm and dry. Rash noted. He is not diaphoretic.     Nursing note and vitals reviewed.        Assessment & Plan:    1. Epidermal cyst -set up removal with Dr. Oneta RackMckeown -doxycycline -stop picking at it -warm compresses -call if spreading redness or increase in pain

## 2015-09-20 ENCOUNTER — Ambulatory Visit (INDEPENDENT_AMBULATORY_CARE_PROVIDER_SITE_OTHER): Payer: PPO | Admitting: Internal Medicine

## 2015-09-20 ENCOUNTER — Encounter: Payer: Self-pay | Admitting: Internal Medicine

## 2015-09-20 VITALS — BP 104/80 | HR 76 | Temp 97.5°F | Resp 16 | Ht 68.75 in | Wt 148.4 lb

## 2015-09-20 DIAGNOSIS — I10 Essential (primary) hypertension: Secondary | ICD-10-CM | POA: Diagnosis not present

## 2015-09-20 DIAGNOSIS — L723 Sebaceous cyst: Secondary | ICD-10-CM

## 2015-09-20 NOTE — Progress Notes (Signed)
Subjective:    Patient ID: Cameron Mcclain, male    DOB: Jan 24, 1941, 75 y.o.   MRN: [id number]  HPI This nice 75 yo MWM with HTN, HLD, preDM, SDAT, Vit D Def presents for recheck and also has concerns about 2 tender lumps of his upper back. Hypertensive systems review is negative for HA, dizziness, CPO, palpitations, dyspnea or ankle swelling.   Outpatient Medications Prior to Visit  Medication Sig Dispense Refill  . B Complex-C (SUPER B COMPLEX PO) Take by mouth daily.    . Calcium-Magnesium-Vitamin D (CALCIUM MAGNESIUM PO) Take 1 tablet by mouth daily.    . Cholecalciferol (VITAMIN D) 2000 UNITS tablet Take 1 tablet (2,000 Units total) by mouth daily.    Marland Kitchen. doxycycline (VIBRAMYCIN) 100 MG capsule Take 1 capsule (100 mg total) by mouth 2 (two) times daily. One po bid x 7 days 14 capsule 0  . gemfibrozil (LOPID) 600 MG tablet Take 1 tablet (600 mg total) by mouth 2 (two) times daily before a meal. 60 tablet 11  . latanoprost (XALATAN) 0.005 % ophthalmic solution Place 1 drop into both eyes at bedtime.     . Omega-3 Fatty Acids (FISH OIL) 1000 MG CAPS Take 1 capsule (1,000 mg total) by mouth 2 (two) times daily.  0  . Apoaequorin (PREVAGEN PO) Take by mouth daily.     No facility-administered medications prior to visit.    Allergies  Allergen Reactions  . Benadryl [Diphenhydramine Hcl] Other (See Comments)    Hallucinations, itching, shaking   Past Medical History:  Diagnosis Date  . Glaucoma   . OSA (obstructive sleep apnea)   . RLS (restless legs syndrome)   . Vitamin D deficiency    Past Surgical History:  Procedure Laterality Date  . axillary Right 1986   biopsy of axillary nodes  . PILONIDAL CYST / SINUS EXCISION  1959   Review of Systems  10 point systems review negative except as above.    Objective:   Physical Exam  BP 104/80   Pulse 76   Temp 97.5 F (36.4 C)   Resp 16   Ht 5' 8.75" (1.746 m)   Wt 148 lb 6.4 oz (67.3 kg)   BMI 22.07 kg/m   HEENT - Eac's  patent. TM's Nl. EOM's full. PERRLA. NasoOroPharynx clear. Neck - supple. Nl Thyroid. Carotids 2+ & No bruits, nodes, JVD Chest - Clear equal BS w/o Rales, rhonchi, wheezes. Cor - Nl HS. RRR w/o sig M. No edema. MS- FROM w/o deformities. Muscle power, tone and bulk Nl. Gait Nl. Neuro - No obvious Cr N abnormalities.  Poor ST recall. Nl w/o focal abnormalities. Psyche - Mental status normal & appropriate.  Pleasant affect.   Skin: there are 2 apparent sebaceous cysts of the upper back. On the right there is an ~3 x 3 cm soft mobile sebaceous cyst.  On the Left there is a smaller 1.5 x 2 cm sebaceous cyst.   Procedure : (CPT 11402) -  After informed consent and aseptic prep with alcohol, the Left sided lesion was anesthetized with 2 ml of Marcaine 0.5% w/epi and then a #11 scalpel created a 2 cm horiz. transverse incision and the subcut. mass was sharply dissected free and delivered and appeared to be a glistening white sebaceous cyst measuring 2.5 x 1.5 cm. The cyst cavity was irrigated with H2O2 and then wound edges approximated with # 4 vertical mattress sutures with Nylon 3-0. Then the wound was aligned and closed with  a running suture (#5) with Nylon and a 4" x 6" Tegoderm was applied and patient & wife were advised in wound care.     Assessment & Plan:

## 2015-09-26 ENCOUNTER — Ambulatory Visit: Payer: PPO | Admitting: Internal Medicine

## 2015-09-26 ENCOUNTER — Encounter: Payer: Self-pay | Admitting: Internal Medicine

## 2015-09-26 VITALS — BP 132/64 | HR 68 | Temp 98.6°F | Resp 14 | Ht 68.75 in | Wt 151.2 lb

## 2015-09-26 DIAGNOSIS — L723 Sebaceous cyst: Secondary | ICD-10-CM

## 2015-09-26 NOTE — Progress Notes (Signed)
    Patient returns 1 week s/p large sebaceous cyst excision of the mid back for partial suture removal & wound check. Wound clean w/o sign of infection and # 3 vertical mattress sutures removed, but healing ridge incompetent, so running suture left intact. Seb cyst of left upper back appears and palpates smaller after this last week ok on bid Doxycline. \    Will Rx Keflex 500 mg #30  Taking 1 qid until return in 1 week and recheck wound & 2sd cyst.     Wife advised wound care to shower - clean w/soap & water, pat dry apply Vaseline and cover w/bandaid

## 2015-09-28 ENCOUNTER — Other Ambulatory Visit: Payer: Self-pay | Admitting: *Deleted

## 2015-09-28 MED ORDER — CEPHALEXIN 500 MG PO CAPS: 500.0000 mg | ORAL_CAPSULE | Freq: Four times a day (QID) | ORAL | 0 refills | Status: DC

## 2015-10-04 ENCOUNTER — Encounter: Payer: Self-pay | Admitting: Internal Medicine

## 2015-10-04 ENCOUNTER — Ambulatory Visit: Payer: PPO | Admitting: Internal Medicine

## 2015-10-04 VITALS — BP 116/68 | HR 64 | Temp 97.0°F | Resp 16 | Ht 68.75 in | Wt 151.2 lb

## 2015-10-04 DIAGNOSIS — L723 Sebaceous cyst: Secondary | ICD-10-CM

## 2015-10-04 MED ORDER — CEPHALEXIN 500 MG PO CAPS: ORAL_CAPSULE | ORAL | 0 refills | Status: AC

## 2015-10-04 NOTE — Progress Notes (Signed)
    All sutures removed with no sign of infection and dry eschar over wound left intact. Seb. Cyst over Lt upper back still tender, but seems smaller.  Rx - Cephalexin 500 mg # 90 take 1 cap 3 x/day after meals

## 2015-10-26 ENCOUNTER — Encounter: Payer: Self-pay | Admitting: Internal Medicine

## 2015-10-26 ENCOUNTER — Ambulatory Visit (INDEPENDENT_AMBULATORY_CARE_PROVIDER_SITE_OTHER): Payer: PPO | Admitting: Internal Medicine

## 2015-10-26 VITALS — BP 122/78 | HR 60 | Temp 97.2°F | Resp 16 | Ht 68.75 in | Wt 149.2 lb

## 2015-10-26 DIAGNOSIS — R7303 Prediabetes: Secondary | ICD-10-CM

## 2015-10-26 DIAGNOSIS — I10 Essential (primary) hypertension: Secondary | ICD-10-CM

## 2015-10-26 DIAGNOSIS — Z1211 Encounter for screening for malignant neoplasm of colon: Secondary | ICD-10-CM

## 2015-10-26 DIAGNOSIS — Z79899 Other long term (current) drug therapy: Secondary | ICD-10-CM

## 2015-10-26 DIAGNOSIS — F0280 Dementia in other diseases classified elsewhere without behavioral disturbance: Secondary | ICD-10-CM

## 2015-10-26 DIAGNOSIS — Z23 Encounter for immunization: Secondary | ICD-10-CM

## 2015-10-26 DIAGNOSIS — G301 Alzheimer's disease with late onset: Secondary | ICD-10-CM

## 2015-10-26 DIAGNOSIS — E782 Mixed hyperlipidemia: Secondary | ICD-10-CM | POA: Diagnosis not present

## 2015-10-26 DIAGNOSIS — G308 Other Alzheimer's disease: Secondary | ICD-10-CM | POA: Diagnosis not present

## 2015-10-26 DIAGNOSIS — E559 Vitamin D deficiency, unspecified: Secondary | ICD-10-CM

## 2015-10-26 LAB — CBC WITH DIFFERENTIAL/PLATELET
BASOS ABS: 124 {cells}/uL (ref 0–200)
BASOS PCT: 2 %
EOS ABS: 310 {cells}/uL (ref 15–500)
EOS PCT: 5 %
HCT: 41 % (ref 38.5–50.0)
Hemoglobin: 14.3 g/dL (ref 13.2–17.1)
LYMPHS ABS: 2790 {cells}/uL (ref 850–3900)
Lymphocytes Relative: 45 %
MCH: 32.6 pg (ref 27.0–33.0)
MCHC: 34.9 g/dL (ref 32.0–36.0)
MCV: 93.4 fL (ref 80.0–100.0)
MONOS PCT: 9 %
MPV: 10.1 fL (ref 7.5–12.5)
Monocytes Absolute: 558 cells/uL (ref 200–950)
NEUTROS ABS: 2418 {cells}/uL (ref 1500–7800)
Neutrophils Relative %: 39 %
PLATELETS: 240 10*3/uL (ref 140–400)
RBC: 4.39 MIL/uL (ref 4.20–5.80)
RDW: 12.9 % (ref 11.0–15.0)
WBC: 6.2 10*3/uL (ref 3.8–10.8)

## 2015-10-26 LAB — BASIC METABOLIC PANEL WITH GFR
BUN: 13 mg/dL (ref 7–25)
CHLORIDE: 100 mmol/L (ref 98–110)
CO2: 27 mmol/L (ref 20–31)
CREATININE: 0.96 mg/dL (ref 0.70–1.18)
Calcium: 9.2 mg/dL (ref 8.6–10.3)
GFR, Est Non African American: 78 mL/min (ref >=60)
Glucose, Bld: 92 mg/dL (ref 65–99)
POTASSIUM: 4 mmol/L (ref 3.5–5.3)
SODIUM: 138 mmol/L (ref 135–146)

## 2015-10-26 LAB — HEPATIC FUNCTION PANEL
ALK PHOS: 50 U/L (ref 40–115)
ALT: 8 U/L — ABNORMAL LOW (ref 9–46)
AST: 18 U/L (ref 10–35)
Albumin: 4.3 g/dL (ref 3.6–5.1)
BILIRUBIN DIRECT: 0.2 mg/dL (ref <=0.2)
BILIRUBIN TOTAL: 0.8 mg/dL (ref 0.2–1.2)
Indirect Bilirubin: 0.6 mg/dL (ref 0.2–1.2)
Total Protein: 6.4 g/dL (ref 6.1–8.1)

## 2015-10-26 LAB — LIPID PANEL
Cholesterol: 195 mg/dL (ref 125–200)
HDL: 58 mg/dL (ref >=40)
LDL CALC: 126 mg/dL (ref <130)
TRIGLYCERIDES: 56 mg/dL (ref <150)
Total CHOL/HDL Ratio: 3.4 Ratio (ref <=5.0)
VLDL: 11 mg/dL (ref <30)

## 2015-10-26 LAB — TSH: TSH: 0.84 m[IU]/L (ref 0.40–4.50)

## 2015-10-26 LAB — MAGNESIUM: Magnesium: 1.9 mg/dL (ref 1.5–2.5)

## 2015-10-26 NOTE — Patient Instructions (Signed)

## 2015-10-26 NOTE — Progress Notes (Signed)
[facility] Lucky Cowboy, M.D.        Dyanne Carrel. Steffanie Dunn, P.A.-C       Terri Piedra, P.A.-C  [facility]                [address]                [place], [place]. [address] Telephone [phone] Telefax [phone] ______________________________________________________________________     This very nice 75 y.o. MWM presents for 6 month follow up with Hypertension, Hyperlipidemia, Pre-Diabetes, SDAT  and Vitamin D Deficiency. Patient's wife stopped his Aricept & Namendia for concern that they worsened his memory.     Patient is treated for HTN (1999)  & BP has been controlled at home. Today's BP is 122/78. Patient has had no complaints of any cardiac type chest pain, palpitations, dyspnea/orthopnea/PND, dizziness, claudication, or dependent edema.     Hyperlipidemia is not  controlled with diet & pt's wife has stopped all of his Statins in the past concerned that they may contribute to his Dementia.  He does take Gemfibrozil w/o SE's.  Last Lipids were not at goal with elevated LDL: Lab Results  Component Value Date   CHOL 202 (H) 07/25/2015   HDL 63 07/25/2015   LDLCALC 124 07/25/2015   TRIG 74 07/25/2015   CHOLHDL 3.2 07/25/2015      Also, the patient has history of PreDiabetes with A1c 5.8% in 2011  and has had no symptoms of reactive hypoglycemia, diabetic polys, paresthesias or visual blurring.  Last A1c was back to normal and at goal: Lab Results  Component Value Date   HGBA1C 5.3 04/20/2015      Further, the patient also has history of Vitamin D Deficiency in 2008 of "24"  and supplements vitamin D without any suspected side-effects. Last vitamin D was   Lab Results  Component Value Date   VD25OH 3 04/20/2015   Current Outpatient Prescriptions on File Prior to Visit  Medication Sig  . B Complex-C (SUPER B COMPLEX PO) Take by mouth daily.  . Calcium-Magnesium-Vitamin D (CALCIUM MAGNESIUM PO) Take  1 tablet by mouth daily.  . cephALEXin (KEFLEX) 500 MG capsule Take 1 capsule 3 x / day after meals for skin infection  . Cholecalciferol (VITAMIN D) 2000 UNITS tablet Take 1 tablet (2,000 Units total) by mouth daily.  Marland Kitchen gemfibrozil (LOPID) 600 MG tablet Take 1 tablet (600 mg total) by mouth 2 (two) times daily before a meal.  . latanoprost (XALATAN) 0.005 % ophthalmic solution Place 1 drop into both eyes at bedtime.   . Omega-3 Fatty Acids (FISH OIL) 1000 MG CAPS Take 1 capsule (1,000 mg total) by mouth 2 (two) times daily.   No current facility-administered medications on file prior to visit.    Allergies  Allergen Reactions  . Benadryl [Diphenhydramine Hcl] Other (See Comments)    Hallucinations, itching, shaking   PMHx:   Past Medical History:  Diagnosis Date  . Glaucoma   . OSA (obstructive sleep apnea)   . RLS (restless legs syndrome)   . Vitamin D deficiency    Immunization History  Administered Date(s) Administered  . Influenza Whole 11/23/2012  . Influenza, High Dose Seasonal PF 11/23/2013, 10/19/2014  . Pneumococcal Conjugate-13 01/12/2014  . Pneumococcal Polysaccharide-23 12/02/2008  . Td 09/26/2010  . Zoster 02/25/2008   Past Surgical History:  Procedure Laterality Date  . axillary Right 1986   biopsy of axillary nodes  .  PILONIDAL CYST / SINUS EXCISION  1959   FHx:    Reviewed / unchanged  SHx:    Reviewed / unchanged  Systems Review:  Constitutional: Denies fever, chills, wt changes, headaches, insomnia, fatigue, night sweats, change in appetite. Eyes: Denies redness, blurred vision, diplopia, discharge, itchy, watery eyes.  ENT: Denies discharge, congestion, post nasal drip, epistaxis, sore throat, earache, hearing loss, dental pain, tinnitus, vertigo, sinus pain, snoring.  CV: Denies chest pain, palpitations, irregular heartbeat, syncope, dyspnea, diaphoresis, orthopnea, PND, claudication or edema. Respiratory: denies cough, dyspnea, DOE, pleurisy,  hoarseness, laryngitis, wheezing.  Gastrointestinal: Denies dysphagia, odynophagia, heartburn, reflux, water brash, abdominal pain or cramps, nausea, vomiting, bloating, diarrhea, constipation, hematemesis, melena, hematochezia  or hemorrhoids. Genitourinary: Denies dysuria, frequency, urgency, nocturia, hesitancy, discharge, hematuria or flank pain. Musculoskeletal: Denies arthralgias, myalgias, stiffness, jt. swelling, pain, limping or strain/sprain.  Skin: Denies pruritus, rash, hives, warts, acne, eczema or change in skin lesion(s). Neuro: No weakness, tremor, incoordination, spasms, paresthesia or pain. Psychiatric: Denies confusion, memory loss or sensory loss. Endo: Denies change in weight, skin or hair change.  Heme/Lymph: No excessive bleeding, bruising or enlarged lymph nodes.  Physical Exam BP 122/78   Pulse 60   Temp 97.2 F (36.2 C)   Resp 16   Ht 5' 8.75" (1.746 m)   Wt 149 lb 3.2 oz (67.7 kg)   BMI 22.19 kg/m   Appears well nourished and in no distress.  Eyes: PERRLA, EOMs, conjunctiva no swelling or erythema. Sinuses: No frontal/maxillary tenderness ENT/Mouth: EAC's clear, TM's nl w/o erythema, bulging. Nares clear w/o erythema, swelling, exudates. Oropharynx clear without erythema or exudates. Oral hygiene is good. Tongue normal, non obstructing. Hearing intact.  Neck: Supple. Thyroid nl. Car 2+/2+ without bruits, nodes or JVD. Chest: Respirations nl with BS clear & equal w/o rales, rhonchi, wheezing or stridor.  Cor: Heart sounds normal w/ regular rate and rhythm without sig. murmurs, gallops, clicks, or rubs. Peripheral pulses normal and equal  without edema.  Abdomen: Soft & bowel sounds normal. Non-tender w/o guarding, rebound, hernias, masses, or organomegaly.  Lymphatics: Unremarkable.  Musculoskeletal: Full ROM all peripheral extremities, joint stability, 5/5 strength, and normal gait.  Skin: Warm, dry without exposed rashes, lesions or ecchymosis apparent.   Neuro: Cranial nerves intact, reflexes equal bilaterally. Sensory-motor testing grossly intact. Tendon reflexes grossly intact.  Pysch: Alert & oriented x 3.  Insight and judgement nl & appropriate. No ideations.  Assessment and Plan:   1. Essential hypertension  - Continue medication, monitor blood pressure at home. Continue DASH diet. Reminder to go to the ER if any CP, SOB, nausea, dizziness, severe HA, changes vision/speech, left arm numbness and tingling and jaw pain. - TSH  2. Hyperlipidemia  - Continue diet/meds, exercise,& lifestyle modifications. Continue monitor periodic cholesterol/liver & renal functions - Lipid panel - TSH  3. Prediabetes  - Continue diet, exercise, lifestyle modifications. Monitor appropriate labs. - Hemoglobin A1c - Insulin, random  4. Vitamin D deficiency  - Continue supplementation. - VITAMIN D 25 Hydroxy  5. SDAT (senile dementia of Alzheimer's type)  6. Medication management  - CBC with Differential/Platelet - BASIC METABOLIC PANEL WITH GFR - Hepatic function panel - Magnesium  7. Need for prophylactic vaccination and inoculation against influenza  - Flu vaccine HIGH DOSE PF (Fluzone High dose)  8. Special screening for malignant neoplasms, colon  - Ambulatory referral to Gastroenterology    Recommended regular exercise, BP monitoring, weight control, and discussed med and SE's. Recommended labs to assess  and monitor clinical status. Further disposition pending results of labs. Over 30 minutes of exam, counseling, chart review was performed

## 2015-10-27 ENCOUNTER — Encounter: Payer: Self-pay | Admitting: Gastroenterology

## 2015-10-27 LAB — HEMOGLOBIN A1C
HEMOGLOBIN A1C: 5.2 % (ref <5.7)
MEAN PLASMA GLUCOSE: 103 mg/dL

## 2015-10-27 LAB — INSULIN, RANDOM: INSULIN: 4.3 u[IU]/mL (ref 2.0–19.6)

## 2015-10-27 LAB — VITAMIN D 25 HYDROXY (VIT D DEFICIENCY, FRACTURES): Vit D, 25-Hydroxy: 87 ng/mL (ref 30–100)

## 2015-11-27 DIAGNOSIS — H2513 Age-related nuclear cataract, bilateral: Secondary | ICD-10-CM | POA: Diagnosis not present

## 2015-11-27 DIAGNOSIS — H401131 Primary open-angle glaucoma, bilateral, mild stage: Secondary | ICD-10-CM | POA: Diagnosis not present

## 2015-12-06 ENCOUNTER — Encounter: Payer: Self-pay | Admitting: Gastroenterology

## 2016-01-25 ENCOUNTER — Ambulatory Visit (INDEPENDENT_AMBULATORY_CARE_PROVIDER_SITE_OTHER): Payer: PPO | Admitting: Physician Assistant

## 2016-01-25 ENCOUNTER — Encounter: Payer: Self-pay | Admitting: Physician Assistant

## 2016-01-25 VITALS — BP 126/80 | HR 95 | Temp 97.5°F | Resp 14 | Ht 68.75 in | Wt 154.0 lb

## 2016-01-25 DIAGNOSIS — R7303 Prediabetes: Secondary | ICD-10-CM

## 2016-01-25 DIAGNOSIS — E782 Mixed hyperlipidemia: Secondary | ICD-10-CM | POA: Diagnosis not present

## 2016-01-25 DIAGNOSIS — I10 Essential (primary) hypertension: Secondary | ICD-10-CM

## 2016-01-25 DIAGNOSIS — F0280 Dementia in other diseases classified elsewhere without behavioral disturbance: Secondary | ICD-10-CM | POA: Diagnosis not present

## 2016-01-25 DIAGNOSIS — G301 Alzheimer's disease with late onset: Secondary | ICD-10-CM | POA: Diagnosis not present

## 2016-01-25 LAB — HEPATIC FUNCTION PANEL
ALT: 10 U/L (ref 9–46)
AST: 20 U/L (ref 10–35)
Albumin: 4.2 g/dL (ref 3.6–5.1)
Alkaline Phosphatase: 50 U/L (ref 40–115)
Bilirubin, Direct: 0.1 mg/dL (ref <=0.2)
Indirect Bilirubin: 0.6 mg/dL (ref 0.2–1.2)
Total Bilirubin: 0.7 mg/dL (ref 0.2–1.2)
Total Protein: 6.4 g/dL (ref 6.1–8.1)

## 2016-01-25 LAB — CBC WITH DIFFERENTIAL/PLATELET
BASOS ABS: 67 {cells}/uL (ref 0–200)
BASOS PCT: 1 %
EOS ABS: 402 {cells}/uL (ref 15–500)
Eosinophils Relative: 6 %
HEMATOCRIT: 43.9 % (ref 38.5–50.0)
Hemoglobin: 14.7 g/dL (ref 13.2–17.1)
LYMPHS PCT: 50 %
Lymphs Abs: 3350 cells/uL (ref 850–3900)
MCH: 32.4 pg (ref 27.0–33.0)
MCHC: 33.5 g/dL (ref 32.0–36.0)
MCV: 96.7 fL (ref 80.0–100.0)
MONO ABS: 603 {cells}/uL (ref 200–950)
MPV: 10.3 fL (ref 7.5–12.5)
Monocytes Relative: 9 %
Neutro Abs: 2278 cells/uL (ref 1500–7800)
Neutrophils Relative %: 34 %
Platelets: 238 10*3/uL (ref 140–400)
RBC: 4.54 MIL/uL (ref 4.20–5.80)
RDW: 12.8 % (ref 11.0–15.0)
WBC: 6.7 10*3/uL (ref 3.8–10.8)

## 2016-01-25 LAB — BASIC METABOLIC PANEL WITH GFR
BUN: 17 mg/dL (ref 7–25)
CO2: 27 mmol/L (ref 20–31)
CREATININE: 0.95 mg/dL (ref 0.70–1.18)
Calcium: 9.3 mg/dL (ref 8.6–10.3)
Chloride: 99 mmol/L (ref 98–110)
GFR, EST NON AFRICAN AMERICAN: 79 mL/min (ref >=60)
GFR, Est African American: >89 mL/min (ref >=60)
GLUCOSE: 80 mg/dL (ref 65–99)
Potassium: 4.1 mmol/L (ref 3.5–5.3)
Sodium: 138 mmol/L (ref 135–146)

## 2016-01-25 LAB — LIPID PANEL
Cholesterol: 180 mg/dL (ref <200)
HDL: 54 mg/dL (ref >40)
LDL Cholesterol: 109 mg/dL — ABNORMAL HIGH (ref <100)
Total CHOL/HDL Ratio: 3.3 Ratio (ref <5.0)
Triglycerides: 86 mg/dL (ref <150)
VLDL: 17 mg/dL (ref <30)

## 2016-01-25 LAB — TSH: TSH: 0.67 mIU/L (ref 0.40–4.50)

## 2016-01-25 NOTE — Progress Notes (Signed)
Assessment and Plan:  1. Hypertension -Continue medication, monitor blood pressure at home. Continue DASH diet.  Reminder to go to the ER if any CP, SOB, nausea, dizziness, severe HA, changes vision/speech, left arm numbness and tingling and jaw pain.  2. Cholesterol -Continue diet and exercise. Check cholesterol.   3. Prediabetes  -Continue diet and exercise. Check A1C  4. Vitamin D Def - check level and continue medications.   5. SDAT Declines medications at this time.   Continue diet and meds as discussed. Further disposition pending results of labs. Over 30 minutes of exam, counseling, chart review, and critical decision making was performed Future Appointments Date Time Provider Department Center  05/27/2016 10:00 AM Lucky CowboyWilliam McKeown, MD [facility] None    HPI 75 y.o. male  presents for 3 month follow up on hypertension, cholesterol, prediabetes, and vitamin D deficiency.   His blood pressure has been controlled at home, today their BP is BP: 126/80  He does workout. He denies chest pain, shortness of breath, dizziness.  He is on cholesterol medication and denies myalgias. His cholesterol is at goal. The cholesterol last visit was:   Lab Results  Component Value Date   CHOL 195 10/26/2015   HDL 58 10/26/2015   LDLCALC 126 10/26/2015   TRIG 56 10/26/2015   CHOLHDL 3.4 10/26/2015   Last R6EA1C in the office was:  Lab Results  Component Value Date   HGBA1C 5.2 10/26/2015   Patient is on Vitamin D supplement.   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  Wife is here with him and states that he has constant memory decline, has been getting progressively worse, short term is worse, he does not cook anymore. Has had some imbalance, better if he stands for a while before walking.   Current Medications:  Current Outpatient Prescriptions on File Prior to  Visit  Medication Sig Dispense Refill  . B Complex-C (SUPER B COMPLEX PO) Take by mouth daily.    . Calcium-Magnesium-Vitamin D (CALCIUM MAGNESIUM PO) Take 1 tablet by mouth daily.    . Cholecalciferol (VITAMIN D) 2000 UNITS tablet Take 1 tablet (2,000 Units total) by mouth daily.    Marland Kitchen. gemfibrozil (LOPID) 600 MG tablet Take 1 tablet (600 mg total) by mouth 2 (two) times daily before a meal. 60 tablet 11  . latanoprost (XALATAN) 0.005 % ophthalmic solution Place 1 drop into both eyes at bedtime.     . Omega-3 Fatty Acids (FISH OIL) 1000 MG CAPS Take 1 capsule (1,000 mg total) by mouth 2 (two) times daily.  0   No current facility-administered medications on file prior to visit.    Medical History:  Past Medical History:  Diagnosis Date  . Glaucoma   . OSA (obstructive sleep apnea)   . RLS (restless legs syndrome)   . Vitamin D deficiency    Allergies:  Allergies  Allergen Reactions  . Benadryl [Diphenhydramine Hcl] Other (See Comments)    Hallucinations, itching, shaking     Review of Systems:  Review of Systems  Constitutional: Negative for chills, fever and malaise/fatigue.  HENT: Negative for congestion, ear discharge, nosebleeds and sore throat.   Eyes: Negative.   Respiratory: Negative for cough, shortness of breath and wheezing.   Cardiovascular: Negative for chest pain, palpitations and leg swelling.  Gastrointestinal: Negative for abdominal pain,  blood in stool, constipation, diarrhea, heartburn, melena and nausea.  Genitourinary: Negative.   Musculoskeletal: Negative for falls.  Skin: Negative.   Neurological: Negative for dizziness, sensory change, loss of consciousness and headaches.  Psychiatric/Behavioral: Positive for memory loss. Negative for depression and suicidal ideas. The patient is not nervous/anxious.     Family history- Review and unchanged Social history- Review and unchanged Physical Exam: BP 126/80   Pulse 95   Temp 97.5 F (36.4 C)   Resp 14    Ht 5' 8.75" (1.746 m)   Wt 154 lb (69.9 kg)   SpO2 95%   BMI 22.91 kg/m  Wt Readings from Last 3 Encounters:  01/25/16 154 lb (69.9 kg)  10/26/15 149 lb 3.2 oz (67.7 kg)  10/04/15 151 lb 3.2 oz (68.6 kg)   General Appearance: Well nourished, in no apparent distress. Eyes: PERRLA, EOMs, conjunctiva no swelling or erythema Sinuses: No Frontal/maxillary tenderness ENT/Mouth: Ext aud canals clear, TMs without erythema, bulging. No erythema, swelling, or exudate on post pharynx.  Tonsils not swollen or erythematous. Hearing normal.  Neck: Supple, thyroid normal.  Respiratory: Respiratory effort normal, BS equal bilaterally without rales, rhonchi, wheezing or stridor.  Cardio: RRR with no MRGs. Brisk peripheral pulses without edema.  Abdomen: Soft, + BS,  Non tender, no guarding, rebound, hernias, masses. Lymphatics: Non tender without lymphadenopathy.  Musculoskeletal: Full ROM, 5/5 strength, slow unsteady gait Skin: Warm, dry without rashes, lesions, ecchymosis.  Neuro: Cranial nerves intact. Normal muscle tone, no cerebellar symptoms. Psych: Awake and oriented X 3, normal affect, Insight and Judgment appropriate.    Quentin MullingAmanda Kenijah Benningfield, PA-C 9:50 AM [facility]

## 2016-01-25 NOTE — Patient Instructions (Signed)
  Vascular Dementia Introduction Dementia is a condition in which a person has problems with thinking, memory, and behavior that are severe enough to interfere with daily life. Vascular dementia is a type of dementia. It results from brain damage that is caused by the brain not getting enough blood. Vascular dementia usually begins between 60 and 75 years of age. What are the causes? Vascular dementia is caused by conditions that lessen blood flow to the brain. Common causes include:  Multiple small strokes. These may happen without symptoms (silent stroke).  Major stroke.  Damage to small blood vessels in the brain (cerebral small vessel disease). What increases the risk?  Advancing age.  Having had a stroke.  Having high blood pressure (hypertension) or high cholesterol.  Having a disease that affects the heart or blood vessels.  Smoking.  Having diabetes.  Being male.  Being obese.  Not being active.  Having depression. What are the signs or symptoms? Symptoms can vary a lot from one person to another. Symptoms may be mild or severe depending on the amount of damage and which parts of the brain have been affected. Symptoms may begin suddenly or may develop gradually. Symptoms may remain stable, or they may get worse over time. Symptoms of vascular dementia may be similar to those of Alzheimer disease. The two conditions can occur together (mixed dementia). Symptoms of vascular dementia may include: Mental  Confusion.  Memory problems.  Poor attention and concentration.  Trouble understanding speech.  Depression.  Personality changes.  Trouble recognizing familiar people.  Agitation or aggression.  Paranoia.  Delusions or hallucinations. Physical  Weakness.  Poor balance.  Loss of bladder or bowel control (incontinence).  Unsteady walking (gait).  Speaking problems. Behavioral  Getting lost in familiar places.  Problems with planning and  judgment.  Trouble following instructions.  Social problems.  Emotional outbursts.  Trouble with daily activities and self-care.  Problems handling money. How is this diagnosed? There is not a specific test to diagnose vascular dementia. The health care provider will consider the person's medical history and symptoms or changes that are reported by friends and family. The health care provider will do a physical exam and may order lab tests or other tests that check brain and nervous system function. Tests that may be done include:  Blood tests.  Brain imaging tests.  Tests of movement, speech, and other daily activities (neurological exam).  Tests of memory, thinking, and problem-solving (neuropsychological or neurocognitive testing). Diagnosis may involve several specialists. These may include a health care provider who specializes in the brain and nervous system (neurologist), a provider who specializes in disorders of the mind (psychiatrist), and a provider who focuses on speech and language changes (speech pathologist). How is this treated? There is no cure for vascular dementia. Brain damage that has already occurred cannot be reversed. Treatment depends on:  How severe the condition is.  Which parts of the brain have been affected.  The person's overall health. Treatment measures aim to:  Treat the underlying cause of vascular dementia and manage risk factors. This may include:  Controlling blood pressure.  Lowering cholesterol.  Treating diabetes.  Quitting smoking.  Losing weight.  Manage symptoms.  Prevent further brain damage.  Improve the person's health and quality of life. Treatment for dementia may involve a team of health care providers, including:  A neurologist.  A psychiatrist.  An occupational therapist.  A speech pathologist.  A cardiologist.  An exercise physiologist or physical therapist.   Follow these instructions at home: Home care  for a person with vascular dementia depends on what caused the condition and how severe the symptoms are. General guidelines for care at home include:  Following the health care provider's instructions for treating the condition that caused the dementia.  Using medicines only as told by the person's health care provider.  Creating a safe living space.  Learning ways to help the person remember people, appointments, and daily activities.  Finding a support group to help caregivers and family to cope with the effects of dementia.  Helping family and friends learn about ways to communicate with a person who has dementia.  Making sure the person keeps all follow-up visits and goes to all rehabilitation appointments as told by the health care team. This is important. Contact a health care provider if:  A fever develops.  New behavioral problems develop.  Problems with swallowing develop.  Confusion gets worse.  Sleepiness gets worse. Get help right away if:  Loss of consciousness occurs.  There is a sudden loss of speech, balance, or thinking ability.  New numbness or paralysis occurs.  Sudden, severe headache occurs.  Vision is lost or suddenly gets worse in one or both eyes. This information is not intended to replace advice given to you by your health care provider. Make sure you discuss any questions you have with your health care provider. Document Released: 01/11/2002 Document Revised: 06/29/2015 Document Reviewed: 05/04/2014  2017 Elsevier  

## 2016-02-06 NOTE — Progress Notes (Signed)
Pt aware of lab results & voiced understanding of those results.

## 2016-03-01 ENCOUNTER — Encounter: Payer: Self-pay | Admitting: Physician Assistant

## 2016-03-01 ENCOUNTER — Ambulatory Visit (INDEPENDENT_AMBULATORY_CARE_PROVIDER_SITE_OTHER): Payer: PPO | Admitting: Physician Assistant

## 2016-03-01 VITALS — BP 122/88 | HR 86 | Temp 97.7°F | Resp 14 | Ht 68.75 in | Wt 156.6 lb

## 2016-03-01 DIAGNOSIS — J0100 Acute maxillary sinusitis, unspecified: Secondary | ICD-10-CM

## 2016-03-01 MED ORDER — AZITHROMYCIN 250 MG PO TABS: ORAL_TABLET | ORAL | 1 refills | Status: AC

## 2016-03-01 MED ORDER — BENZONATATE 100 MG PO CAPS: 200.0000 mg | ORAL_CAPSULE | Freq: Three times a day (TID) | ORAL | 0 refills | Status: DC | PRN

## 2016-03-01 MED ORDER — PREDNISONE 20 MG PO TABS: ORAL_TABLET | ORAL | 0 refills | Status: DC

## 2016-03-01 NOTE — Progress Notes (Signed)
Subjective:    Patient ID: Cameron Mcclain, male    DOB: 1940/06/29, 76 y.o.   MRN: [id number]  HPI 76 y.o. WM with dementia presents with cough/sinus issues x 2 weeks and gets worse. Wife is here with him. Has had sinus drainage, congestion, cough without mucus, SOB, wheezing, no fever, chills, CP, diaphoresis.   Blood pressure 122/88, pulse 86, temperature 97.7 F (36.5 C), resp. rate 14, height 5' 8.75" (1.746 m), weight 156 lb 9.6 oz (71 kg), SpO2 95 %.  Medications Current Outpatient Prescriptions on File Prior to Visit  Medication Sig  . B Complex-C (SUPER B COMPLEX PO) Take by mouth daily.  . Calcium-Magnesium-Vitamin D (CALCIUM MAGNESIUM PO) Take 1 tablet by mouth daily.  . Cholecalciferol (VITAMIN D) 2000 UNITS tablet Take 1 tablet (2,000 Units total) by mouth daily.  Marland Kitchen gemfibrozil (LOPID) 600 MG tablet Take 1 tablet (600 mg total) by mouth 2 (two) times daily before a meal.  . latanoprost (XALATAN) 0.005 % ophthalmic solution Place 1 drop into both eyes at bedtime.   . Omega-3 Fatty Acids (FISH OIL) 1000 MG CAPS Take 1 capsule (1,000 mg total) by mouth 2 (two) times daily.   No current facility-administered medications on file prior to visit.     Problem list He has Essential hypertension; Hyperlipidemia; Prediabetes; Vitamin D deficiency; SDAT (senile dementia of Alzheimer's type); Fibromyalgia; Medication management; Glaucoma; OSA (obstructive sleep apnea); and Body mass index (BMI) of 24.0-24.9 in adult on his problem list.   Review of Systems  Constitutional: Negative for chills, diaphoresis and fever.  HENT: Positive for congestion, postnasal drip, rhinorrhea, sinus pressure and sore throat. Negative for ear pain, sneezing, trouble swallowing and voice change.   Eyes: Negative.   Respiratory: Positive for cough, shortness of breath and wheezing. Negative for chest tightness.   Cardiovascular: Negative.   Gastrointestinal: Negative.   Genitourinary: Negative.    Musculoskeletal: Negative.  Negative for neck pain.  Neurological: Negative.  Negative for headaches.       Objective:   Physical Exam  Constitutional: He is oriented to person, place, and time. He appears well-developed and well-nourished.  HENT:  Head: Normocephalic and atraumatic.  Right Ear: Hearing and tympanic membrane normal.  Left Ear: Hearing and tympanic membrane normal.  Nose: Right sinus exhibits maxillary sinus tenderness. Left sinus exhibits maxillary sinus tenderness.  Mouth/Throat: Uvula is midline and mucous membranes are normal. Posterior oropharyngeal erythema present. No oropharyngeal exudate, posterior oropharyngeal edema or tonsillar abscesses.  Eyes: Conjunctivae are normal. Pupils are equal, round, and reactive to light.  Neck: Normal range of motion. Neck supple.  Cardiovascular: Normal rate and regular rhythm.   Pulmonary/Chest: Effort normal and breath sounds normal. He has no wheezes. He has no rales.  Abdominal: Soft. Bowel sounds are normal.  Musculoskeletal: Normal range of motion.  Lymphadenopathy:    He has no cervical adenopathy.  Neurological: He is alert and oriented to person, place, and time.  Skin: Skin is warm and dry. No rash noted.      Assessment & Plan:  1. Acute maxillary sinusitis, recurrence not specified Will hold the zpak and take if she is not getting better, increase fluids, rest, cont allergy pill - azithromycin (ZITHROMAX) 250 MG tablet; Take 2 tablets (500 mg) on  Day 1,  followed by 1 tablet (250 mg) once daily on Days 2 through 5.  Dispense: 6 each; Refill: 1 - predniSONE (DELTASONE) 20 MG tablet; 2 tablets daily for 3 days, 1  tablet daily for 4 days.  Dispense: 10 tablet; Refill: 0 - benzonatate (TESSALON PERLES) 100 MG capsule; Take 2 capsules (200 mg total) by mouth 3 (three) times daily as needed for cough (Max: 600mg  per day).  Dispense: 60 capsule; Refill: 0

## 2016-03-01 NOTE — Patient Instructions (Signed)

## 2016-03-18 DIAGNOSIS — H2513 Age-related nuclear cataract, bilateral: Secondary | ICD-10-CM | POA: Diagnosis not present

## 2016-03-18 DIAGNOSIS — H401131 Primary open-angle glaucoma, bilateral, mild stage: Secondary | ICD-10-CM | POA: Diagnosis not present

## 2016-03-26 ENCOUNTER — Ambulatory Visit (INDEPENDENT_AMBULATORY_CARE_PROVIDER_SITE_OTHER): Payer: PPO | Admitting: Physician Assistant

## 2016-03-26 ENCOUNTER — Encounter: Payer: Self-pay | Admitting: Physician Assistant

## 2016-03-26 ENCOUNTER — Ambulatory Visit (HOSPITAL_COMMUNITY)
Admission: RE | Admit: 2016-03-26 | Discharge: 2016-03-26 | Disposition: A | Discharge status: Home / Self Care | Payer: PPO | Source: Ambulatory Visit | Attending: Physician Assistant | Admitting: Physician Assistant

## 2016-03-26 VITALS — BP 126/80 | HR 68 | Temp 97.5°F | Resp 14 | Ht 68.75 in | Wt 156.8 lb

## 2016-03-26 DIAGNOSIS — I700 Atherosclerosis of aorta: Secondary | ICD-10-CM | POA: Insufficient documentation

## 2016-03-26 DIAGNOSIS — J441 Chronic obstructive pulmonary disease with (acute) exacerbation: Secondary | ICD-10-CM

## 2016-03-26 DIAGNOSIS — J439 Emphysema, unspecified: Secondary | ICD-10-CM | POA: Insufficient documentation

## 2016-03-26 DIAGNOSIS — R05 Cough: Secondary | ICD-10-CM | POA: Diagnosis not present

## 2016-03-26 MED ORDER — ALBUTEROL SULFATE 108 (90 BASE) MCG/ACT IN AEPB: 1.0000 | INHALATION_SPRAY | Freq: Three times a day (TID) | RESPIRATORY_TRACT | 3 refills | Status: DC | PRN

## 2016-03-26 NOTE — Patient Instructions (Addendum)
Safe to take claritin, take one daily, day or night with or without food  Can get flonase or nasocort over the counter nasal spray Can do a steroid nasal spary 1-2 sparys at night each nostril. Remember to spray each nostril twice towards the outer part of your eye.  Do not sniff but instead pinch your nose and tilt your head back to help the medicine get into your sinuses.  The best time to do this is at bedtime. Stop if you get blurred vision or nose bleeds.   Start back on the prednisone, do 2 pills tonight with food, and then one pill at night with food until you run out.    Use the proair/albuterol inhaler once in the morning and once at lunch for 4-5 days, shake inhaler before use and then do 1-2 spray in your mouth, then can use AS needed, can make his feel shaky/nervous, this is normal.     Chronic Obstructive Pulmonary Disease Exacerbation Chronic obstructive pulmonary disease (COPD) is a common lung problem. In COPD, the flow of air from the lungs is limited. COPD exacerbations are times that breathing gets worse and you need extra treatment. Without treatment they can be life threatening. If they happen often, your lungs can become more damaged. If your COPD gets worse, your doctor may treat you with:  Medicines.  Oxygen.  Different ways to clear your airway, such as using a mask. Follow these instructions at home:  Do not smoke.  Avoid tobacco smoke and other things that bother your lungs.  If given, take your antibiotic medicine as told. Finish the medicine even if you start to feel better.  Only take medicines as told by your doctor.  Drink enough fluids to keep your pee (urine) clear or pale yellow (unless your doctor has told you not to).  Use a cool mist machine (vaporizer).  If you use oxygen or a machine that turns liquid medicine into a mist (nebulizer), continue to use them as told.  Keep up with shots (vaccinations) as told by your doctor.  Exercise  regularly.  Eat healthy foods.  Keep all doctor visits as told. Get help right away if:  You are very short of breath and it gets worse.  You have trouble talking.  You have bad chest pain.  You have blood in your spit (sputum).  You have a fever.  You keep throwing up (vomiting).  You feel weak, or you pass out (faint).  You feel confused.  You keep getting worse. This information is not intended to replace advice given to you by your health care provider. Make sure you discuss any questions you have with your health care provider. Document Released: 01/10/2011 Document Revised: 06/29/2015 Document Reviewed: 09/25/2012 Elsevier Interactive Patient Education  2017 [employer]

## 2016-03-26 NOTE — Progress Notes (Signed)
Pt's wife was made aware of CXR results & voiced understanding.

## 2016-03-26 NOTE — Progress Notes (Signed)
   Subjective:    Patient ID: Cameron Mcclain, male    DOB: 1941-01-30, 76 y.o.   MRN: [id number]  HPI 76 y.o. WM presents with worsening symptoms. He was given prednisone and zpak on 01/26, he was feeling better until 2 days ago - 3 days ago, he is having fatigue, SOB, and cough without mucus.   Blood pressure 126/80, pulse 68, temperature 97.5 F (36.4 C), resp. rate 14, height 5' 8.75" (1.746 m), weight 156 lb 12.8 oz (71.1 kg), SpO2 97 %.  Medications Current Outpatient Prescriptions on File Prior to Visit  Medication Sig  . B Complex-C (SUPER B COMPLEX PO) Take by mouth daily.  . benzonatate (TESSALON PERLES) 100 MG capsule Take 2 capsules (200 mg total) by mouth 3 (three) times daily as needed for cough (Max: 600mg  per day).  . Calcium-Magnesium-Vitamin D (CALCIUM MAGNESIUM PO) Take 1 tablet by mouth daily.  . Cholecalciferol (VITAMIN D) 2000 UNITS tablet Take 1 tablet (2,000 Units total) by mouth daily.  Marland Kitchen. gemfibrozil (LOPID) 600 MG tablet Take 1 tablet (600 mg total) by mouth 2 (two) times daily before a meal.  . latanoprost (XALATAN) 0.005 % ophthalmic solution Place 1 drop into both eyes at bedtime.   . Omega-3 Fatty Acids (FISH OIL) 1000 MG CAPS Take 1 capsule (1,000 mg total) by mouth 2 (two) times daily.   No current facility-administered medications on file prior to visit.     Problem list He has Essential hypertension; Hyperlipidemia; Prediabetes; Vitamin D deficiency; SDAT (senile dementia of Alzheimer's type); Fibromyalgia; Medication management; Glaucoma; OSA (obstructive sleep apnea); and Body mass index (BMI) of 24.0-24.9 in adult on his problem list.   Review of Systems  Constitutional: Negative for chills, diaphoresis and fever.  HENT: Positive for congestion, sinus pressure, sneezing and sore throat. Negative for ear pain, trouble swallowing and voice change.   Eyes: Negative.   Respiratory: Positive for cough. Negative for chest tightness, shortness of breath  and wheezing.   Cardiovascular: Negative.   Gastrointestinal: Negative.   Genitourinary: Negative.   Musculoskeletal: Negative for neck pain.  Neurological: Negative.  Negative for headaches.       Objective:   Physical Exam  Constitutional: He is oriented to person, place, and time. He appears well-developed and well-nourished.  HENT:  Head: Normocephalic and atraumatic.  Right Ear: External ear normal.  Left Ear: External ear normal.  Nose: Nose normal.  Mouth/Throat: Oropharynx is clear and moist.  Eyes: Conjunctivae are normal. Pupils are equal, round, and reactive to light.  Neck: Normal range of motion. Neck supple.  Cardiovascular: Normal rate, regular rhythm and normal heart sounds.   No murmur heard. Pulmonary/Chest: Effort normal. No respiratory distress. He has wheezes. He has no rales. He exhibits no tenderness.  Abdominal: Soft. Bowel sounds are normal.  Lymphadenopathy:    He has no cervical adenopathy.  Neurological: He is alert and oriented to person, place, and time.  Skin: Skin is warm and dry.       Assessment & Plan:  1. COPD exacerbation (HCC) Will send in ABX pending CXR - prednisone, flonase, claritin with strict instructions - DG Chest 2 View; Future - Albuterol Sulfate (PROAIR RESPICLICK) 108 (90 Base) MCG/ACT AEPB; Inhale 1 Inhaler into the lungs 3 (three) times daily as needed.  Dispense: 1 each; Refill: 3

## 2016-05-25 NOTE — Progress Notes (Signed)
[facility]   Cameron Mcclain, Cameron Mcclain, P.A.-C Cameron Mcclain                [address]                [place], [place]. [address] Telephone [phone] Telefax [phone] Annual  Screening/Preventative Visit  & Comprehensive Evaluation & Examination     This very nice 76 y.o. MWM presents for a Screening/Preventative Visit & comprehensive evaluation and management of multiple medical co-morbidities.  Patient has been followed for HTN, SDAT, Prediabetes, Hyperlipidemia and Vitamin D Deficiency.Patient had been on Aricept which was d/c'd for side-effects and wife d/c'd Namenda for suspect of lack of benefit. Patient remains independent in personal hygiene, reads and has no retention of any short term recall.      HTN predates since 1999. Patient's BP has been controlled at home.  Today's BP is at goal - 110/72. Patient denies any cardiac symptoms as chest pain, palpitations, shortness of breath, dizziness or ankle swelling.     Patient's hyperlipidemia is not controlled with diet and wife has declined Statin medications because of his Dementia. Patient denies myalgias or other medication SE's. Last lipids were near goal on Gemfibrozil with sl elevated LDL: Lab Results  Component Value Date   CHOL 180 01/25/2016   HDL 54 01/25/2016   LDLCALC 109 (H) 01/25/2016   TRIG 86 01/25/2016   CHOLHDL 3.3 01/25/2016      Patient has prediabetes(A1c 5.8% in 2011) and patient denies reactive hypoglycemic symptoms, visual blurring, diabetic polys or paresthesias. Last A1c was at goal:  Lab Results  Component Value Date   HGBA1C 5.2 10/26/2015       Finally, patient has history of Vitamin D Deficiency ("24" in 2008) and last vitamin D was at goal: Lab Results  Component Value Date   VD25OH 87 10/26/2015   Current Outpatient Prescriptions on File Prior to Visit  Medication Sig  . PROAIR RESPICLICK  1 Inhale   3 x daily as needed.  . SUPER B COMPLEX Take by mouth daily.  . Calcium-Mag-Vitamin D  Take 1 tab daily.  Marland Kitchen VITAMIN D 2000 UNITS Take 1 tab daily.  Marland Kitchen gemfibrozil  600 MG tablet Take 1 tab 2 x daily before a meal.  . XALATAN ophth soln Place 1 drop into  eyes at bedtime.   . Omega-3 FISH OIL 1000 MG  Take 1 cap 2 xdaily.   Allergies  Allergen Reactions  . Benadryl [Diphenhydramine Hcl] Other (See Comments)    Hallucinations, itching, shaking   Past Medical History:  Diagnosis Date  . Glaucoma   . OSA (obstructive sleep apnea)   . RLS (restless legs syndrome)   . Vitamin D deficiency    Health Maintenance  Topic Date Due  . INFLUENZA VACCINE  09/04/2016  . COLONOSCOPY  10/22/2016  . TETANUS/TDAP  09/25/2020  . PNA vac Low Risk Adult  Completed   Immunization History  Administered Date(s) Administered  . Influenza Whole 11/23/2012  . Influenza, High Dose Seasonal PF 11/23/2013, 10/19/2014, 10/26/2015  . Pneumococcal Conjugate-13 01/12/2014  . Pneumococcal Polysaccharide-23 12/02/2008  . Td 09/26/2010  . Zoster 02/25/2008   Past Surgical History:  Procedure Laterality Date  . axillary Right 1986   biopsy of axillary nodes  . PILONIDAL CYST / SINUS EXCISION  1959   Family History  Problem Relation Age of Onset  .  Hypertension Mother   . Diabetes Mother   . Hypertension Father   . Cancer Father    Social History   Social History  . Marital status: Married    Spouse name: N/A  . Number of children: N/A  . Years of education: N/A   Occupational History  . Not on file.   Social History Main Topics  . Smoking status: Former Smoker    Quit date: 05/10/2009  . Smokeless tobacco: Never Used  . Alcohol use 0.0 oz/week  . Drug use: No  . Sexual activity: Not on file    ROS Constitutional: Denies fever, chills, weight loss/gain, headaches, insomnia,  night sweats or change in appetite. Does c/o fatigue. Eyes: Denies redness, blurred vision, diplopia, discharge,  itchy or watery eyes.  ENT: Denies discharge, congestion, post nasal drip, epistaxis, sore throat, earache, hearing loss, dental pain, Tinnitus, Vertigo, Sinus pain or snoring.  Cardio: Denies chest pain, palpitations, irregular heartbeat, syncope, dyspnea, diaphoresis, orthopnea, PND, claudication or edema Respiratory: denies cough, dyspnea, DOE, pleurisy, hoarseness, laryngitis or wheezing.  Gastrointestinal: Denies dysphagia, heartburn, reflux, water brash, pain, cramps, nausea, vomiting, bloating, diarrhea, constipation, hematemesis, melena, hematochezia, jaundice or hemorrhoids Genitourinary: Denies dysuria, frequency, urgency, nocturia, hesitancy, discharge, hematuria or flank pain Musculoskeletal: Denies arthralgia, myalgia, stiffness, Jt. Swelling, pain, limp or strain/sprain. Denies Falls. Skin: Denies puritis, rash, hives, warts, acne, eczema or change in skin lesion Neuro: No weakness, tremor, incoordination, spasms, paresthesia or pain Psychiatric: Denies confusion, memory loss or sensory loss. Denies Depression. Endocrine: Denies change in weight, skin, hair change, nocturia, and paresthesia, diabetic polys, visual blurring or hyper / hypo glycemic episodes.  Heme/Lymph: No excessive bleeding, bruising or enlarged lymph nodes.  Physical Exam  BP 110/72   Pulse 76   Temp 97.8 F (36.6 C)   Resp 16   Ht  (1.753 m)   Wt 152 lb 12.8 oz (69.3 kg)   BMI 22.56 kg/m   General Appearance: Well nourished and well groomed and in no apparent distress.  Eyes: PERRLA, EOMs, conjunctiva no swelling or erythema, normal fundi and vessels. Sinuses: No frontal/maxillary tenderness ENT/Mouth: EACs patent / TMs  nl. Nares clear without erythema, swelling, mucoid exudates. Oral hygiene is good. No erythema, swelling, or exudate. Tongue normal, non-obstructing. Tonsils not swollen or erythematous. Hearing normal.  Neck: Supple, thyroid normal. No bruits, nodes or JVD. Respiratory:  Respiratory effort normal.  BS equal and clear bilateral without rales, rhonci, wheezing or stridor. Cardio: Heart sounds are normal with regular rate and rhythm and no murmurs, rubs or gallops. Peripheral pulses are normal and equal bilaterally without edema. No aortic or femoral bruits. Chest: symmetric with normal excursions and percussion.  Abdomen: Soft, with Nl bowel sounds. Nontender, no guarding, rebound, hernias, masses, or organomegaly.  Lymphatics: Non tender without lymphadenopathy.  Genitourinary: No hernias.Testes nl. DRE - prostate nl for age - smooth & firm w/o nodules. Musculoskeletal: Full ROM all peripheral extremities, joint stability, 5/5 strength, and normal gait. Skin: Warm and dry without rashes, lesions, cyanosis, clubbing or  ecchymosis.  Neuro: Cranial nerves intact, reflexes equal bilaterally. Normal muscle tone, no cerebellar symptoms. Sensation intact.  Pysch: Alert and oriented X 3 with normal affect, insight and judgment appropriate.   Assessment and Plan  1. Annual Preventative/Screening Exam   2. Essential hypertension  - EKG 12-Lead - [place], RETROPERITNL ABD,  LTD - CBC with Differential/Platelet - Urinalysis, Routine w reflex microscopic - Microalbumin / creatinine urine ratio - BASIC METABOLIC PANEL WITH  GFR - Magnesium - TSH  3. Hyperlipidemia, mixed  - EKG 12-Lead - [place], RETROPERITNL ABD,  LTD - Hepatic function panel - Lipid panel - TSH  4. Prediabetes  - EKG 12-Lead - [place], RETROPERITNL ABD,  LTD - Hemoglobin A1c - Insulin, random  5. Vitamin D deficiency  - VITAMIN D 25 Hydroxy   6. SDAT (senile dementia of Alzheimer's type)   7. Screening for rectal cancer  - POC Hemoccult Bld/Stl  8. Prostate cancer screening  - PSA  9. OSA (obstructive sleep apnea)   10. Screening for ischemic heart disease  - EKG 12-Lead - TSH  11. Screening for AAA (aortic abdominal aneurysm)  - [place], RETROPERITNL ABD,  LTD  12. Medication  management  - CBC with Differential/Platelet - Urinalysis, Routine w reflex microscopic - Microalbumin / creatinine urine ratio - BASIC METABOLIC PANEL WITH GFR - Hepatic function panel - Magnesium - Lipid panel - TSH - Hemoglobin A1c - Insulin, random - VITAMIN D 25 Hydroxy        Patient was counseled in prudent diet, weight contro to achieve/maintain BMI less than 25, BP monitoring, regular exercise and medications as discussed.  Discussed med effects and SE's. Routine screening labs and tests as requested with regular follow-up as recommended. Over 40 minutes of exam, counseling, chart review and high complex critical decision making was performed

## 2016-05-25 NOTE — Patient Instructions (Signed)

## 2016-05-27 ENCOUNTER — Ambulatory Visit (INDEPENDENT_AMBULATORY_CARE_PROVIDER_SITE_OTHER): Payer: PPO | Admitting: Internal Medicine

## 2016-05-27 ENCOUNTER — Encounter: Payer: Self-pay | Admitting: Internal Medicine

## 2016-05-27 VITALS — BP 110/72 | HR 76 | Temp 97.8°F | Resp 16 | Ht 69.0 in | Wt 152.8 lb

## 2016-05-27 DIAGNOSIS — Z136 Encounter for screening for cardiovascular disorders: Secondary | ICD-10-CM

## 2016-05-27 DIAGNOSIS — Z1212 Encounter for screening for malignant neoplasm of rectum: Secondary | ICD-10-CM

## 2016-05-27 DIAGNOSIS — E559 Vitamin D deficiency, unspecified: Secondary | ICD-10-CM

## 2016-05-27 DIAGNOSIS — E782 Mixed hyperlipidemia: Secondary | ICD-10-CM | POA: Diagnosis not present

## 2016-05-27 DIAGNOSIS — Z0000 Encounter for general adult medical examination without abnormal findings: Secondary | ICD-10-CM

## 2016-05-27 DIAGNOSIS — F0280 Dementia in other diseases classified elsewhere without behavioral disturbance: Secondary | ICD-10-CM

## 2016-05-27 DIAGNOSIS — R7303 Prediabetes: Secondary | ICD-10-CM | POA: Diagnosis not present

## 2016-05-27 DIAGNOSIS — I10 Essential (primary) hypertension: Secondary | ICD-10-CM | POA: Diagnosis not present

## 2016-05-27 DIAGNOSIS — N320 Bladder-neck obstruction: Secondary | ICD-10-CM | POA: Diagnosis not present

## 2016-05-27 DIAGNOSIS — G4733 Obstructive sleep apnea (adult) (pediatric): Secondary | ICD-10-CM

## 2016-05-27 DIAGNOSIS — Z79899 Other long term (current) drug therapy: Secondary | ICD-10-CM | POA: Diagnosis not present

## 2016-05-27 DIAGNOSIS — Z125 Encounter for screening for malignant neoplasm of prostate: Secondary | ICD-10-CM | POA: Diagnosis not present

## 2016-05-27 DIAGNOSIS — Z0001 Encounter for general adult medical examination with abnormal findings: Secondary | ICD-10-CM

## 2016-05-27 DIAGNOSIS — G301 Alzheimer's disease with late onset: Secondary | ICD-10-CM

## 2016-05-27 LAB — CBC WITH DIFFERENTIAL/PLATELET
BASOS PCT: 1 %
Basophils Absolute: 72 cells/uL (ref 0–200)
EOS PCT: 6 %
Eosinophils Absolute: 432 cells/uL (ref 15–500)
HEMATOCRIT: 43.2 % (ref 38.5–50.0)
Hemoglobin: 14.8 g/dL (ref 13.2–17.1)
LYMPHS PCT: 47 %
Lymphs Abs: 3384 cells/uL (ref 850–3900)
MCH: 32.3 pg (ref 27.0–33.0)
MCHC: 34.3 g/dL (ref 32.0–36.0)
MCV: 94.3 fL (ref 80.0–100.0)
MONO ABS: 648 {cells}/uL (ref 200–950)
MPV: 10 fL (ref 7.5–12.5)
Monocytes Relative: 9 %
NEUTROS PCT: 37 %
Neutro Abs: 2664 cells/uL (ref 1500–7800)
PLATELETS: 254 10*3/uL (ref 140–400)
RBC: 4.58 MIL/uL (ref 4.20–5.80)
RDW: 12.9 % (ref 11.0–15.0)
WBC: 7.2 10*3/uL (ref 3.8–10.8)

## 2016-05-27 LAB — LIPID PANEL
Cholesterol: 186 mg/dL (ref <200)
HDL: 53 mg/dL (ref >40)
LDL CALC: 116 mg/dL — AB (ref <100)
TRIGLYCERIDES: 87 mg/dL (ref <150)
Total CHOL/HDL Ratio: 3.5 Ratio (ref <5.0)
VLDL: 17 mg/dL (ref <30)

## 2016-05-27 LAB — HEPATIC FUNCTION PANEL
ALK PHOS: 44 U/L (ref 40–115)
ALT: 8 U/L — ABNORMAL LOW (ref 9–46)
AST: 18 U/L (ref 10–35)
Albumin: 4.2 g/dL (ref 3.6–5.1)
BILIRUBIN TOTAL: 0.7 mg/dL (ref 0.2–1.2)
Bilirubin, Direct: 0.1 mg/dL (ref <=0.2)
Indirect Bilirubin: 0.6 mg/dL (ref 0.2–1.2)
TOTAL PROTEIN: 6.6 g/dL (ref 6.1–8.1)

## 2016-05-27 LAB — BASIC METABOLIC PANEL WITH GFR
BUN: 14 mg/dL (ref 7–25)
CO2: 23 mmol/L (ref 20–31)
CREATININE: 0.87 mg/dL (ref 0.70–1.18)
Calcium: 9.3 mg/dL (ref 8.6–10.3)
Chloride: 100 mmol/L (ref 98–110)
GFR, Est Non African American: 84 mL/min (ref >=60)
GLUCOSE: 75 mg/dL (ref 65–99)
POTASSIUM: 4.2 mmol/L (ref 3.5–5.3)
Sodium: 137 mmol/L (ref 135–146)

## 2016-05-27 LAB — MAGNESIUM: Magnesium: 2.1 mg/dL (ref 1.5–2.5)

## 2016-05-27 LAB — TSH: TSH: 0.61 mIU/L (ref 0.40–4.50)

## 2016-05-28 LAB — URINALYSIS, ROUTINE W REFLEX MICROSCOPIC
Bilirubin Urine: NEGATIVE
Glucose, UA: NEGATIVE
Hgb urine dipstick: NEGATIVE
KETONES UR: NEGATIVE
LEUKOCYTES UA: NEGATIVE
NITRITE: NEGATIVE
PROTEIN: NEGATIVE
Specific Gravity, Urine: 1.009 (ref 1.001–1.035)
pH: 7.5 (ref 5.0–8.0)

## 2016-05-28 LAB — HEMOGLOBIN A1C
Hgb A1c MFr Bld: 5.1 % (ref <5.7)
MEAN PLASMA GLUCOSE: 100 mg/dL

## 2016-05-28 LAB — PSA: PSA: 3.2 ng/mL (ref <=4.0)

## 2016-05-28 LAB — MICROALBUMIN / CREATININE URINE RATIO: CREATININE, URINE: 52 mg/dL (ref 20–370)

## 2016-05-28 LAB — VITAMIN D 25 HYDROXY (VIT D DEFICIENCY, FRACTURES): Vit D, 25-Hydroxy: 96 ng/mL (ref 30–100)

## 2016-05-28 LAB — INSULIN, RANDOM: Insulin: 3.3 u[IU]/mL (ref 2.0–19.6)

## 2016-07-08 DIAGNOSIS — H401131 Primary open-angle glaucoma, bilateral, mild stage: Secondary | ICD-10-CM | POA: Diagnosis not present

## 2016-07-08 DIAGNOSIS — H2513 Age-related nuclear cataract, bilateral: Secondary | ICD-10-CM | POA: Diagnosis not present

## 2016-08-20 ENCOUNTER — Other Ambulatory Visit: Payer: Self-pay

## 2016-08-20 MED ORDER — GEMFIBROZIL 600 MG PO TABS: 600.0000 mg | ORAL_TABLET | Freq: Two times a day (BID) | ORAL | 11 refills | Status: DC

## 2016-09-10 DIAGNOSIS — J449 Chronic obstructive pulmonary disease, unspecified: Secondary | ICD-10-CM | POA: Insufficient documentation

## 2016-09-10 NOTE — Progress Notes (Signed)
MEDICARE ANNUAL WELLNESS VISIT AND FOLLOW UP Assessment:   Essential hypertension - continue medications, DASH diet, exercise and monitor at home. Call if greater than 130/80.  -     CBC with Differential/Platelet -     BASIC METABOLIC PANEL WITH GFR -     Hepatic function panel -     TSH  Hyperlipidemia -continue medications, check lipids, decrease fatty foods, increase activity.  -     Lipid panel  Prediabetes Continue to eat healthy/weight management  Medication management -     Magnesium  Vitamin D deficiency Continue supplement  OSA (obstructive sleep apnea) Off since weight loss, wife will observe at night, ? Still need  SDAT (senile dementia of Alzheimer's type) Does not want treatment and is aware of progressive nature of disease  Fibromyalgia Controlled, continue exercise  Glaucoma, unspecified glaucoma type, unspecified laterality Continue follow up  Encounter for Medicare annual wellness exam 1 year  Advanced care counseling/discussion Discussed advanced care planning with patient  Chronic obstructive pulmonary disease, unspecified COPD type (HCC) Monitor symptoms, avoid triggers   Over 30 minutes of exam, counseling, chart review, and critical decision making was performed  Future Appointments Date Time Provider Department Center  12/16/2016 10:30 AM Lucky Cowboy, MD [facility] None  06/18/2017 10:00 AM Lucky Cowboy, MD [facility] None     Plan:   During the course of the visit the patient was educated and counseled about appropriate screening and preventive services including:    Pneumococcal vaccine   Influenza vaccine  Prevnar 13  Td vaccine  Screening electrocardiogram  Colorectal cancer screening  Diabetes screening  Glaucoma screening  Nutrition counseling    Subjective:  Cameron Mcclain is a 76 y.o. male who presents for Medicare Annual Wellness Visit and 3 month follow up for HTN, hyperlipidemia, prediabetes,  and vitamin D Def.   His blood pressure has been controlled at home, today their BP is BP: 118/80 He does workout. He is walking with his cat in his yard and also push mows his yard.  He denies chest pain, shortness of breath, dizziness.  His wife is with him, has memory loss/SDAT that is stable at this time. He is not on a CPAP. He is reading very slowly, he has dizziness, some hypotension.  He is on cholesterol medication and denies myalgias. His cholesterol is at goal. The cholesterol last visit was:   Lab Results  Component Value Date   CHOL 186 05/27/2016   HDL 53 05/27/2016   LDLCALC 116 (H) 05/27/2016   TRIG 87 05/27/2016   CHOLHDL 3.5 05/27/2016   He is working on diet and exercise.  He has had prediabetes in the past, but has managed this well with diet and exercise. Lab Results  Component Value Date   HGBA1C 5.1 05/27/2016   Last GFR Lab Results  Component Value Date   GFRNONAA 84 05/27/2016   Patient is on Vitamin D supplement.   Lab Results  Component Value Date   VD25OH 96 05/27/2016     BMI is Body mass index is 22.3 kg/m., he is working on diet and exercise. Has not been on a CPAP for years and years, wife states he does not snore and no apnea.  Wt Readings from Last 3 Encounters:  09/11/16 151 lb (68.5 kg)  05/27/16 152 lb 12.8 oz (69.3 kg)  03/26/16 156 lb 12.8 oz (71.1 kg)    Medication Review: Current Outpatient Prescriptions on File Prior to Visit  Medication Sig  Dispense Refill  . Albuterol Sulfate (PROAIR RESPICLICK) 108 (90 Base) MCG/ACT AEPB Inhale 1 Inhaler into the lungs 3 (three) times daily as needed. 1 each 3  . B Complex-C (SUPER B COMPLEX PO) Take by mouth daily.    . Calcium-Magnesium-Vitamin D (CALCIUM MAGNESIUM PO) Take 1 tablet by mouth daily.    . Cholecalciferol (VITAMIN D) 2000 UNITS tablet Take 1 tablet (2,000 Units total) by mouth daily.    Marland Kitchen. gemfibrozil (LOPID) 600 MG tablet Take 1 tablet (600 mg total) by mouth 2 (two) times daily  before a meal. 60 tablet 11  . latanoprost (XALATAN) 0.005 % ophthalmic solution Place 1 drop into both eyes at bedtime.     Marland Kitchen. loratadine (CLARITIN) 10 MG tablet Take 10 mg by mouth daily.    . Omega-3 Fatty Acids (FISH OIL) 1000 MG CAPS Take 1 capsule (1,000 mg total) by mouth 2 (two) times daily.  0   No current facility-administered medications on file prior to visit.    Current Problems (verified) Patient Active Problem List   Diagnosis Date Noted  . Chronic obstructive pulmonary disease (HCC) 09/10/2016  . Glaucoma 10/19/2014  . OSA (obstructive sleep apnea) 10/19/2014  . Body mass index (BMI) of 24.0-24.9 in adult 10/19/2014  . Medication management 07/01/2013  . Essential hypertension 12/29/2012  . Hyperlipidemia 12/29/2012  . Prediabetes 12/29/2012  . Vitamin D deficiency 12/29/2012  . SDAT (senile dementia of Alzheimer's type) 12/29/2012  . Fibromyalgia 12/29/2012    Screening Tests Immunization History  Administered Date(s) Administered  . Influenza Whole 11/23/2012  . Influenza, High Dose Seasonal PF 11/23/2013, 10/19/2014, 10/26/2015  . Pneumococcal Conjugate-13 01/12/2014  . Pneumococcal Polysaccharide-23 12/02/2008  . Td 09/26/2010  . Zoster 02/25/2008    Preventative care: Last colonoscopy: 2012 CT head 2013 atrophy  Prior vaccinations: TD or Tdap: 2012  Influenza: 2016  Pneumococcal: 2010 Prevnar13: 2015 Shingles/Zostavax: 2010  Names of Other Physician/Practitioners you currently use: 1. [facility] here for primary care 2. Dr. Emily FilbertGould, eye doctor, last visit 2018 3. Dr. Sharma CovertNorman, q 6 months Patient Care Team: Lucky CowboyMcKeown, [name], MD as PCP - General (Internal Medicine)  Allergies Allergies  Allergen Reactions  . Benadryl [Diphenhydramine Hcl] Other (See Comments)    Hallucinations, itching, shaking    SURGICAL HISTORY He  has a past surgical history that includes Pilonidal cyst / sinus excision (1959) and  axillary (Right, 1986). FAMILY HISTORY His family history includes Cancer in his father; Diabetes in his mother; Hypertension in his father and mother. SOCIAL HISTORY He  reports that he quit smoking about 7 years ago. He has never used smokeless tobacco. He reports that he drinks alcohol. He reports that he does not use drugs.  MEDICARE WELLNESS OBJECTIVES: Physical activity: Current Exercise Habits: The patient does not participate in regular exercise at present Cardiac risk factors: Cardiac Risk Factors include: advanced age (>355men, 54>65 women);diabetes mellitus;dyslipidemia;family history of premature cardiovascular disease;hypertension Depression/mood screen:   Depression screen [facility] 2/9 09/11/2016  Decreased Interest 0  Down, Depressed, Hopeless 0  PHQ - 2 Score 0    ADLs:  In your present state of health, do you have any difficulty performing the following activities: 09/11/2016 05/27/2016  Hearing? N N  Vision? N N  Difficulty concentrating or making decisions? Y Y  Comment - Patient recognizes that he cannot remenber past 3 minutes and has limited insight  Walking or climbing stairs? N N  Dressing or bathing? N N  Doing errands,  shopping? Y Y  Comment - wife does all odf the driving.  Preparing Food and eating ? Y -  Using the Toilet? N -  In the past six months, have you accidently leaked urine? N -  Do you have problems with loss of bowel control? N -  Managing your Medications? Y -  Managing your Finances? Y -  Housekeeping or managing your Housekeeping? Y -  Some recent data might be hidden     Cognitive Testing  Alert? Yes  Normal Appearance?Yes  Oriented to person? Yes  Place? Yes   Time? No  Recall of three objects?  No  Can perform simple calculations? No  Displays appropriate judgment?No  Can read the correct time from a watch face?No  EOL planning: Does Patient Have a Medical Advance Directive?: Yes Type of Advance Directive: Healthcare Power of Attorney,  Living will Copy of Healthcare Power of Attorney in Chart?: Yes We do have a copy in Epic from 2015  Objective:   Today's Vitals   09/11/16 1034  BP: 118/80  Pulse: 68  Resp: 14  Temp: (!) 97.3 F (36.3 C)  SpO2: 97%  Weight: 151 lb (68.5 kg)  Height: 5\' 9"  (1.753 m)  PainSc: 0-No pain   Body mass index is 22.3 kg/m.  General appearance: alert, no distress, WD/WN, male HEENT: normocephalic, sclerae anicteric, TMs pearly, nares patent, no discharge or erythema, pharynx normal Oral cavity: MMM, no lesions Neck: supple, no lymphadenopathy, no thyromegaly, no masses Heart: RRR, normal S1, S2, no murmurs Lungs: CTA bilaterally, no wheezes, rhonchi, or rales Abdomen: +bs, soft, non tender, non distended, no masses, no hepatomegaly, no splenomegaly Musculoskeletal: nontender, no swelling, no obvious deformity Extremities: no edema, no cyanosis, no clubbing Pulses: 2+ symmetric, upper and lower extremities, normal cap refill Neurological: alert, oriented x 3, CN2-12 intact, strength normal upper extremities and lower extremities, sensation normal throughout, DTRs 2+ throughout, no cerebellar signs, gait normal Psychiatric: normal affect, behavior normal, pleasant   Medicare Attestation I have personally reviewed: The patient's medical and social history Their use of alcohol, tobacco or illicit drugs Their current medications and supplements The patient's functional ability including ADLs,fall risks, home safety risks, cognitive, and hearing and visual impairment Diet and physical activities Evidence for depression or mood disorders  The patient's weight, height, BMI, and visual acuity have been recorded in the chart.  I have made referrals, counseling, and provided education to the patient based on review of the above and I have provided the patient with a written personalized care plan for preventive services.     Quentin Mulling, PA-C   09/11/2016

## 2016-09-11 ENCOUNTER — Encounter: Payer: Self-pay | Admitting: Physician Assistant

## 2016-09-11 ENCOUNTER — Ambulatory Visit (INDEPENDENT_AMBULATORY_CARE_PROVIDER_SITE_OTHER): Payer: PPO | Admitting: Physician Assistant

## 2016-09-11 VITALS — BP 118/80 | HR 68 | Temp 97.3°F | Resp 14 | Ht 69.0 in | Wt 151.0 lb

## 2016-09-11 DIAGNOSIS — G4733 Obstructive sleep apnea (adult) (pediatric): Secondary | ICD-10-CM | POA: Diagnosis not present

## 2016-09-11 DIAGNOSIS — J449 Chronic obstructive pulmonary disease, unspecified: Secondary | ICD-10-CM

## 2016-09-11 DIAGNOSIS — F0280 Dementia in other diseases classified elsewhere without behavioral disturbance: Secondary | ICD-10-CM

## 2016-09-11 DIAGNOSIS — H409 Unspecified glaucoma: Secondary | ICD-10-CM

## 2016-09-11 DIAGNOSIS — G301 Alzheimer's disease with late onset: Secondary | ICD-10-CM | POA: Diagnosis not present

## 2016-09-11 DIAGNOSIS — R7303 Prediabetes: Secondary | ICD-10-CM | POA: Diagnosis not present

## 2016-09-11 DIAGNOSIS — I10 Essential (primary) hypertension: Secondary | ICD-10-CM | POA: Diagnosis not present

## 2016-09-11 DIAGNOSIS — Z7189 Other specified counseling: Secondary | ICD-10-CM

## 2016-09-11 DIAGNOSIS — E782 Mixed hyperlipidemia: Secondary | ICD-10-CM

## 2016-09-11 DIAGNOSIS — Z0000 Encounter for general adult medical examination without abnormal findings: Secondary | ICD-10-CM

## 2016-09-11 DIAGNOSIS — M790 Rheumatism, unspecified: Secondary | ICD-10-CM | POA: Diagnosis not present

## 2016-09-11 DIAGNOSIS — Z79899 Other long term (current) drug therapy: Secondary | ICD-10-CM

## 2016-09-11 DIAGNOSIS — E559 Vitamin D deficiency, unspecified: Secondary | ICD-10-CM

## 2016-09-11 DIAGNOSIS — M797 Fibromyalgia: Secondary | ICD-10-CM

## 2016-09-11 LAB — CBC WITH DIFFERENTIAL/PLATELET
BASOS PCT: 1 %
Basophils Absolute: 66 cells/uL (ref 0–200)
EOS ABS: 396 {cells}/uL (ref 15–500)
Eosinophils Relative: 6 %
HCT: 42.9 % (ref 38.5–50.0)
Hemoglobin: 14.4 g/dL (ref 13.2–17.1)
LYMPHS PCT: 49 %
Lymphs Abs: 3234 cells/uL (ref 850–3900)
MCH: 31.9 pg (ref 27.0–33.0)
MCHC: 33.6 g/dL (ref 32.0–36.0)
MCV: 94.9 fL (ref 80.0–100.0)
MONOS PCT: 10 %
MPV: 10.3 fL (ref 7.5–12.5)
Monocytes Absolute: 660 cells/uL (ref 200–950)
NEUTROS ABS: 2244 {cells}/uL (ref 1500–7800)
Neutrophils Relative %: 34 %
PLATELETS: 261 10*3/uL (ref 140–400)
RBC: 4.52 MIL/uL (ref 4.20–5.80)
RDW: 12.7 % (ref 11.0–15.0)
WBC: 6.6 10*3/uL (ref 3.8–10.8)

## 2016-09-11 LAB — TSH: TSH: 0.63 mIU/L (ref 0.40–4.50)

## 2016-09-11 NOTE — Patient Instructions (Signed)
Memory Compensation Strategies  1. Use "WARM" strategy.  W= write it down  A= associate it  R= repeat it  M= make a mental note  2.   You can keep a Glass blower/designer.  Use a 3-ring notebook with sections for the following: calendar, important names and phone numbers,  medications, doctors' names/phone numbers, lists/reminders, and a section to journal what you did  each day.   3.    Use a calendar to write appointments down.  4.    Write yourself a schedule for the day.  This can be placed on the calendar or in a separate section of the Memory Notebook.  Keeping a  regular schedule can help memory.  5.    Use medication organizer with sections for each day or morning/evening pills.  You may need help loading it  6.    Keep a basket, or pegboard by the door.  Place items that you need to take out with you in the basket or on the pegboard.  You may also want to  include a message board for reminders.  7.    Use sticky notes.  Place sticky notes with reminders in a place where the task is performed.  For example: " turn off the  stove" placed by the stove, "lock the door" placed on the door at eye level, " take your medications" on  the bathroom mirror or by the place where you normally take your medications.  8.    Use alarms/timers.  Use while cooking to remind yourself to check on food or as a reminder to take your medicine, or as a  reminder to make a call, or as a reminder to perform another task, etc.   Dementia  Dementia is a general term for problems with brain function. A person with dementia has memory loss and a hard time with at least one other brain function such as thinking, speaking, or problem solving. Dementia can affect social functioning, how you do your job, your mood, or your personality. The changes may be hidden for a long time. The earliest forms of this disease are usually not detected by family or friends.  Dementia can be:  Irreversible.  Potentially  reversible.  Partially reversible.  Progressive. This means it can get worse over time. CAUSES  Irreversible dementia causes may include:  Degeneration of brain cells (Alzheimer's disease or lewy body dementia).  Multiple small strokes (vascular dementia).  Infection (chronic meningitis or Creutzfelt-Jakob disease).  Frontotemporal dementia. This affects younger people, age 80 to 64, compared to those who have Alzheimer's disease.  Dementia associated with other disorders like Parkinson's disease, Huntington's disease, or HIV-associated dementia. Potentially or partially reversible dementia causes may include:  Medicines.  Metabolic causes such as excessive alcohol intake, vitamin B12 deficiency, or thyroid disease.  Masses or pressure in the brain such as a tumor, blood clot, or hydrocephalus. SYMPTOMS  Symptoms are often hard to detect. Family members or coworkers may not notice them early in the disease process. Different people with dementia may have different symptoms. Symptoms can include:  A hard time with memory, especially recent memory. Long-term memory may not be impaired.  Asking the same question multiple times or forgetting something someone just said.  A hard time speaking your thoughts or finding certain words.  A hard time solving problems or performing familiar tasks (such as how to use a telephone).  Sudden changes in mood.  Changes in personality, especially increasing moodiness or mistrust.  Depression.  A hard time understanding complex ideas that were never a problem in the past. DIAGNOSIS  There are no specific tests for dementia.  Your caregiver may recommend a thorough evaluation. This is because some forms of dementia can be reversible. The evaluation will likely include a physical exam and getting a detailed history from you and a family member. The history often gives the best clues and suggestions for a diagnosis.  Memory testing may be done. A detailed brain  function evaluation called neuropsychologic testing may be helpful.  Lab tests and brain imaging (such as a CT scan or MRI scan) are sometimes important.  Sometimes observation and re-evaluation over time is very helpful. TREATMENT  Treatment depends on the cause.  If the problem is a vitamin deficiency, it may be helped or cured with supplements.  For dementias such as Alzheimer's disease, medicines are available to stabilize or slow the course of the disease. There are no cures for this type of dementia.  Your caregiver can help direct you to groups, organizations, and other caregivers to help with decisions in the care of you or your loved one. HOME CARE INSTRUCTIONS  The care of individuals with dementia is varied and dependent upon the progression of the dementia. The following suggestions are intended for the person living with, or caring for, the person with dementia.  Create a safe environment.  Remove the locks on bathroom doors to prevent the person from accidentally locking himself or herself in.  Use childproof latches on kitchen cabinets and any place where cleaning supplies, chemicals, or alcohol are kept.  Use childproof covers in unused electrical outlets.  Install childproof devices to keep doors and windows secured.  Remove stove knobs or install safety knobs and an automatic shut-off on the stove.  Lower the temperature on water heaters.  Label medicines and keep them locked up.  Secure knives, lighters, matches, power tools, and guns, and keep these items out of reach.  Keep the house free from clutter. Remove rugs or anything that might contribute to a fall.  Remove objects that might break and hurt the person.  Make sure lighting is good, both inside and outside.  Install grab rails as needed.  Use a monitoring device to alert you to falls or other needs for help.  Reduce confusion.  Keep familiar objects and people around.  Use night lights or dim lights at night.   Label items or areas.  Use reminders, notes, or directions for daily activities or tasks.  Keep a simple, consistent routine for waking, meals, bathing, dressing, and bedtime.  Create a calm, quiet environment.  Place large clocks and calendars prominently.  Display emergency numbers and home address near all telephones.  Use cues to establish different times of the day. An example is to open curtains to let the natural light in during the day.  Use effective communication.  Choose simple words and short sentences.  Use a gentle, calm tone of voice.  Be careful not to interrupt.  If the person is struggling to find a word or communicate a thought, try to provide the word or thought.  Ask one question at a time. Allow the person ample time to answer questions. Repeat the question again if the person does not respond.  Reduce nighttime restlessness.  Provide a comfortable bed.  Have a consistent nighttime routine.  Ensure a regular walking or physical activity schedule. Involve the person in daily activities as much as possible.  Limit  napping during the day.  Limit caffeine.  Attend social events that stimulate rather than overwhelm the senses.  Encourage good nutrition and hydration.  Reduce distractions during meal times and snacks.  Avoid foods that are too hot or too cold.  Monitor chewing and swallowing ability.  Continue with routine vision, hearing, dental, and medical screenings.  Only give over-the-counter or prescription medicines as directed by the caregiver.  Monitor driving abilities. Do not allow the person to drive when safe driving is no longer possible.  Register with an identification program which could provide location assistance in the event of a missing person situation. SEEK MEDICAL CARE IF:  New behavioral problems start such as moodiness, aggressiveness, or seeing things that are not there (hallucinations).  Any new problem with brain function happens. This  includes problems with balance, speech, or falling a lot.  Problems with swallowing develop.  Any symptoms of other illness happen. Small changes or worsening in any aspect of brain function can be a sign that the illness is getting worse. It can also be a sign of another medical illness such as infection. Seeing a caregiver right away is important.  SEEK IMMEDIATE MEDICAL CARE IF:  A fever develops.  New or worsened confusion develops.  New or worsened sleepiness develops.  Staying awake becomes hard to do. Document Released: 07/17/2000 Document Revised: 04/15/2011 Document Reviewed: 06/18/2010  [facility] Patient Information 2014 [place], [place].

## 2016-09-12 LAB — HEPATIC FUNCTION PANEL
ALBUMIN: 4.3 g/dL (ref 3.6–5.1)
ALK PHOS: 56 U/L (ref 40–115)
ALT: 8 U/L — ABNORMAL LOW (ref 9–46)
AST: 18 U/L (ref 10–35)
BILIRUBIN DIRECT: 0.1 mg/dL (ref <=0.2)
BILIRUBIN INDIRECT: 0.6 mg/dL (ref 0.2–1.2)
BILIRUBIN TOTAL: 0.7 mg/dL (ref 0.2–1.2)
Total Protein: 6.5 g/dL (ref 6.1–8.1)

## 2016-09-12 LAB — BASIC METABOLIC PANEL WITH GFR
BUN: 17 mg/dL (ref 7–25)
CHLORIDE: 101 mmol/L (ref 98–110)
CO2: 23 mmol/L (ref 20–32)
Calcium: 9.8 mg/dL (ref 8.6–10.3)
Creat: 0.9 mg/dL (ref 0.70–1.18)
GFR, EST NON AFRICAN AMERICAN: 83 mL/min (ref >=60)
Glucose, Bld: 79 mg/dL (ref 65–99)
POTASSIUM: 4.4 mmol/L (ref 3.5–5.3)
SODIUM: 136 mmol/L (ref 135–146)

## 2016-09-12 LAB — LIPID PANEL
CHOL/HDL RATIO: 3.3 ratio (ref <5.0)
Cholesterol: 193 mg/dL (ref <200)
HDL: 59 mg/dL (ref >40)
LDL Cholesterol: 121 mg/dL — ABNORMAL HIGH (ref <100)
Triglycerides: 63 mg/dL (ref <150)
VLDL: 13 mg/dL (ref <30)

## 2016-09-12 LAB — MAGNESIUM: MAGNESIUM: 1.9 mg/dL (ref 1.5–2.5)

## 2016-09-12 NOTE — Progress Notes (Signed)
Pt aware of lab results & voiced understanding of those results.

## 2016-11-05 ENCOUNTER — Ambulatory Visit: Payer: PPO

## 2016-11-11 DIAGNOSIS — H2513 Age-related nuclear cataract, bilateral: Secondary | ICD-10-CM | POA: Diagnosis not present

## 2016-11-11 DIAGNOSIS — H401131 Primary open-angle glaucoma, bilateral, mild stage: Secondary | ICD-10-CM | POA: Diagnosis not present

## 2016-12-15 NOTE — Progress Notes (Signed)
This very nice 76 y.o. MWM presents for 6 month follow up with Hypertension, Hyperlipidemia, SDAT, Pre-Diabetes and Vitamin D Deficiency.  Patient had been on Aricept & Namenda and wife d/c'd for concern of possible side effects and perceived lack of  immediate benefit. Wife relates patient short term memory is gradually getting worse. Patient is a ware that his recall is non existent.      Patient is treated for HTN (1999)  & BP has been controlled at home. Today's BP is at goal - 122/72. Patient has had no complaints of any cardiac type chest pain, palpitations, dyspnea / orthopnea / PND, dizziness, claudication, or dependent edema.     Hyperlipidemia is not controlled with diet & wife d/c'd his statins for concern statins worsened his Dementia. Patient has been on Gemfibrozil and is not at goal. Patient denies myalgias or other med SE's. Last Lipids were not at goal:   Lab Results  Component Value Date   CHOL 193 09/11/2016   HDL 59 09/11/2016   LDLCALC 121 (H) 09/11/2016   TRIG 63 09/11/2016   CHOLHDL 3.3 09/11/2016      Also, the patient has history of PreDiabetes (A1c 5.8% / 2011) and has had no symptoms of reactive hypoglycemia, diabetic polys, paresthesias or visual blurring.  Last A1c was normal & at goal: Lab Results  Component Value Date   HGBA1C 5.1 05/27/2016      Further, the patient also has history of Vitamin D Deficiency ("24" / 2008) and supplements vitamin D without any suspected side-effects. Last vitamin D was at goal:    Lab Results  Component Value Date   VD25OH 96 05/27/2016   Current Outpatient Medications on File Prior to Visit  Medication Sig  . Albuterol Sulfate (PROAIR RESPICLICK) 108 (90 Base) MCG/ACT AEPB Inhale 1 Inhaler into the lungs 3 (three) times daily as needed.  . B Complex-C (SUPER B COMPLEX PO) Take by mouth daily.  . Calcium-Magnesium-Vitamin D (CALCIUM MAGNESIUM PO) Take 1 tablet by mouth daily.  . Cholecalciferol (VITAMIN D) 2000 UNITS  tablet Take 1 tablet (2,000 Units total) by mouth daily.  Marland Kitchen. gemfibrozil (LOPID) 600 MG tablet Take 1 tablet (600 mg total) by mouth 2 (two) times daily before a meal.  . latanoprost (XALATAN) 0.005 % ophthalmic solution Place 1 drop into both eyes at bedtime.   Marland Kitchen. loratadine (CLARITIN) 10 MG tablet Take 10 mg by mouth daily.  . Omega-3 Fatty Acids (FISH OIL) 1000 MG CAPS Take 1 capsule (1,000 mg total) by mouth 2 (two) times daily.   No current facility-administered medications on file prior to visit.    Allergies  Allergen Reactions  . Benadryl [Diphenhydramine Hcl] Other (See Comments)    Hallucinations, itching, shaking   PMHx:   Past Medical History:  Diagnosis Date  . Glaucoma   . OSA (obstructive sleep apnea)   . RLS (restless legs syndrome)   . Vitamin D deficiency    Immunization History  Administered Date(s) Administered  . Influenza Whole 11/23/2012  . Influenza, High Dose Seasonal PF 11/23/2013, 10/19/2014, 10/26/2015, 11/05/2016  . Pneumococcal Conjugate-13 01/12/2014  . Pneumococcal Polysaccharide-23 12/02/2008  . Td 09/26/2010  . Zoster 02/25/2008   Past Surgical History:  Procedure Laterality Date  . axillary Right 1986   biopsy of axillary nodes  . PILONIDAL CYST / SINUS EXCISION  1959   FHx:    Reviewed / unchanged  SHx:    Reviewed / unchanged  Systems  Review:  Constitutional: Denies fever, chills, wt changes, headaches, insomnia, fatigue, night sweats, change in appetite. Eyes: Denies redness, blurred vision, diplopia, discharge, itchy, watery eyes.  ENT: Denies discharge, congestion, post nasal drip, epistaxis, sore throat, earache, hearing loss, dental pain, tinnitus, vertigo, sinus pain, snoring.  CV: Denies chest pain, palpitations, irregular heartbeat, syncope, dyspnea, diaphoresis, orthopnea, PND, claudication or edema. Respiratory: denies cough, dyspnea, DOE, pleurisy, hoarseness, laryngitis, wheezing.  Gastrointestinal: Denies dysphagia,  odynophagia, heartburn, reflux, water brash, abdominal pain or cramps, nausea, vomiting, bloating, diarrhea, constipation, hematemesis, melena, hematochezia  or hemorrhoids. Genitourinary: Denies dysuria, frequency, urgency, nocturia, hesitancy, discharge, hematuria or flank pain. Musculoskeletal: Denies arthralgias, myalgias, stiffness, jt. swelling, pain, limping or strain/sprain.  Skin: Denies pruritus, rash, hives, warts, acne, eczema or change in skin lesion(s). Neuro: No weakness, tremor, incoordination, spasms, paresthesia or pain. Psychiatric: Denies confusion, memory loss or sensory loss. Endo: Denies change in weight, skin or hair change.  Heme/Lymph: No excessive bleeding, bruising or enlarged lymph nodes.  Physical Exam  BP 122/72   Pulse 68   Temp (!) 97.5 F (36.4 C)   Ht 5\' 9"  (1.753 m)   Wt 152 lb 6.4 oz (69.1 kg)   SpO2 97%   BMI 22.51 kg/m   Appears well nourished, well groomed  and in no distress.  Eyes: PERRLA, EOMs, conjunctiva no swelling or erythema. Sinuses: No frontal/maxillary tenderness ENT/Mouth: EAC's clear, TM's nl w/o erythema, bulging. Nares clear w/o erythema, swelling, exudates. Oropharynx clear without erythema or exudates. Oral hygiene is good. Tongue normal, non obstructing. Hearing intact.  Neck: Supple. Thyroid nl. Car 2+/2+ without bruits, nodes or JVD. Chest: Respirations nl with BS clear & equal w/o rales, rhonchi, wheezing or stridor.  Cor: Heart sounds normal w/ regular rate and rhythm without sig. murmurs, gallops, clicks or rubs. Peripheral pulses normal and equal  without edema.  Abdomen: Soft & bowel sounds normal. Non-tender w/o guarding, rebound, hernias, masses or organomegaly.  Lymphatics: Unremarkable.  Musculoskeletal: Full ROM all peripheral extremities, joint stability, 5/5 strength and normal gait.  Skin: Warm, dry without exposed rashes, lesions or ecchymosis apparent.  Neuro: Cranial nerves intact, reflexes equal bilaterally.  Sensory-motor testing grossly intact. Tendon reflexes grossly intact.  Pysch: Alert & oriented x 3, but Short Term recall is non existent.  Insight and judgement nl & appropriate. No ideations.  Assessment and Plan:  1. Essential hypertension  - Continue medication, monitor blood pressure at home.  - Continue DASH diet. Reminder to go to the ER if any CP,  SOB, nausea, dizziness, severe HA, changes vision/speech.  - CBC with Differential/Platelet - BASIC METABOLIC PANEL WITH GFR - Magnesium - TSH  2. Hyperlipidemia, mixed  - Continue diet/meds, exercise,& lifestyle modifications.  - Continue monitor periodic cholesterol/liver & renal functions   - Hepatic function panel - Lipid panel - TSH  3. Prediabetes  - Continue diet, exercise, lifestyle modifications.  - Monitor appropriate labs.  - Hemoglobin A1c - Insulin, random  4. Vitamin D deficiency  - Continue supplementation.  - VITAMIN D 25 Hydroxy  5. OSA (obstructive sleep apnea)   6. SDAT (senile dementia of Alzheimer's type)   7. Medication management  - CBC with Differential/Platelet - BASIC METABOLIC PANEL WITH GFR - Hepatic function panel - Magnesium - Lipid panel - TSH - Hemoglobin A1c - Insulin, random - VITAMIN D 25 Hydroxy        Discussed  regular exercise, BP monitoring, weight control to achieve/maintain BMI less than 25 and discussed  med and SE's. Recommended labs to assess and monitor clinical status with further disposition pending results of labs. Over 30 minutes of exam, counseling, chart review was performed.

## 2016-12-15 NOTE — Patient Instructions (Signed)

## 2016-12-16 ENCOUNTER — Encounter: Payer: Self-pay | Admitting: Internal Medicine

## 2016-12-16 ENCOUNTER — Ambulatory Visit: Payer: PPO | Admitting: Internal Medicine

## 2016-12-16 VITALS — BP 122/72 | HR 68 | Temp 97.5°F | Ht 69.0 in | Wt 152.4 lb

## 2016-12-16 DIAGNOSIS — E782 Mixed hyperlipidemia: Secondary | ICD-10-CM

## 2016-12-16 DIAGNOSIS — F0280 Dementia in other diseases classified elsewhere without behavioral disturbance: Secondary | ICD-10-CM | POA: Diagnosis not present

## 2016-12-16 DIAGNOSIS — G301 Alzheimer's disease with late onset: Secondary | ICD-10-CM | POA: Diagnosis not present

## 2016-12-16 DIAGNOSIS — Z79899 Other long term (current) drug therapy: Secondary | ICD-10-CM

## 2016-12-16 DIAGNOSIS — G4733 Obstructive sleep apnea (adult) (pediatric): Secondary | ICD-10-CM | POA: Diagnosis not present

## 2016-12-16 DIAGNOSIS — R7303 Prediabetes: Secondary | ICD-10-CM

## 2016-12-16 DIAGNOSIS — I10 Essential (primary) hypertension: Secondary | ICD-10-CM

## 2016-12-16 DIAGNOSIS — E559 Vitamin D deficiency, unspecified: Secondary | ICD-10-CM

## 2016-12-18 ENCOUNTER — Other Ambulatory Visit: Payer: Self-pay | Admitting: Internal Medicine

## 2016-12-18 DIAGNOSIS — E782 Mixed hyperlipidemia: Secondary | ICD-10-CM

## 2016-12-18 LAB — HEPATIC FUNCTION PANEL
AG RATIO: 1.8 (calc) (ref 1.0–2.5)
ALBUMIN MSPROF: 4.6 g/dL (ref 3.6–5.1)
ALT: 9 U/L (ref 9–46)
AST: 17 U/L (ref 10–35)
Alkaline phosphatase (APISO): 58 U/L (ref 40–115)
BILIRUBIN DIRECT: 0.2 mg/dL (ref 0.0–0.2)
BILIRUBIN INDIRECT: 0.6 mg/dL (ref 0.2–1.2)
GLOBULIN: 2.5 g/dL (ref 1.9–3.7)
TOTAL PROTEIN: 7.1 g/dL (ref 6.1–8.1)
Total Bilirubin: 0.8 mg/dL (ref 0.2–1.2)

## 2016-12-18 LAB — CBC WITH DIFFERENTIAL/PLATELET
BASOS ABS: 108 {cells}/uL (ref 0–200)
BASOS PCT: 1.5 %
EOS ABS: 403 {cells}/uL (ref 15–500)
EOS PCT: 5.6 %
HEMATOCRIT: 44.3 % (ref 38.5–50.0)
HEMOGLOBIN: 15.3 g/dL (ref 13.2–17.1)
LYMPHS ABS: 3096 {cells}/uL (ref 850–3900)
MCH: 32.4 pg (ref 27.0–33.0)
MCHC: 34.5 g/dL (ref 32.0–36.0)
MCV: 93.9 fL (ref 80.0–100.0)
MPV: 10.4 fL (ref 7.5–12.5)
Monocytes Relative: 8.8 %
NEUTROS ABS: 2959 {cells}/uL (ref 1500–7800)
Neutrophils Relative %: 41.1 %
Platelets: 273 10*3/uL (ref 140–400)
RBC: 4.72 10*6/uL (ref 4.20–5.80)
RDW: 11.3 % (ref 11.0–15.0)
Total Lymphocyte: 43 %
WBC mixed population: 634 cells/uL (ref 200–950)
WBC: 7.2 10*3/uL (ref 3.8–10.8)

## 2016-12-18 LAB — BASIC METABOLIC PANEL WITH GFR
BUN: 12 mg/dL (ref 7–25)
CALCIUM: 9.8 mg/dL (ref 8.6–10.3)
CO2: 32 mmol/L (ref 20–32)
CREATININE: 0.93 mg/dL (ref 0.70–1.18)
Chloride: 98 mmol/L (ref 98–110)
GFR, EST AFRICAN AMERICAN: 93 mL/min/{1.73_m2} (ref >=60)
GFR, Est Non African American: 80 mL/min/{1.73_m2} (ref >=60)
Glucose, Bld: 83 mg/dL (ref 65–99)
Potassium: 4.2 mmol/L (ref 3.5–5.3)
Sodium: 136 mmol/L (ref 135–146)

## 2016-12-18 LAB — TSH: TSH: 0.66 mIU/L (ref 0.40–4.50)

## 2016-12-18 LAB — MAGNESIUM: MAGNESIUM: 2.1 mg/dL (ref 1.5–2.5)

## 2016-12-18 LAB — HEMOGLOBIN A1C
Hgb A1c MFr Bld: 5.2 % of total Hgb (ref <5.7)
Mean Plasma Glucose: 103 (calc)
eAG (mmol/L): 5.7 (calc)

## 2016-12-18 LAB — LIPID PANEL
CHOL/HDL RATIO: 3.4 (calc) (ref <5.0)
CHOLESTEROL: 222 mg/dL — AB (ref <200)
HDL: 66 mg/dL (ref >40)
LDL CHOLESTEROL (CALC): 138 mg/dL — AB
Non-HDL Cholesterol (Calc): 156 mg/dL (calc) — ABNORMAL HIGH (ref <130)
TRIGLYCERIDES: 79 mg/dL (ref <150)

## 2016-12-18 LAB — VITAMIN D 25 HYDROXY (VIT D DEFICIENCY, FRACTURES): VIT D 25 HYDROXY: 79 ng/mL (ref 30–100)

## 2016-12-18 LAB — INSULIN, RANDOM: INSULIN: 3.1 u[IU]/mL (ref 2.0–19.6)

## 2016-12-18 MED ORDER — EZETIMIBE 10 MG PO TABS: ORAL_TABLET | ORAL | 1 refills | Status: DC

## 2016-12-31 ENCOUNTER — Telehealth: Payer: Self-pay | Admitting: *Deleted

## 2016-12-31 NOTE — Telephone Encounter (Signed)
Spouse was informed it is OK to take the Zetia at the same time as Gemfibrozil, either at the morning or evening dose.

## 2017-03-10 ENCOUNTER — Encounter: Payer: Self-pay | Admitting: Adult Health

## 2017-03-10 ENCOUNTER — Ambulatory Visit (INDEPENDENT_AMBULATORY_CARE_PROVIDER_SITE_OTHER): Payer: PPO | Admitting: Adult Health

## 2017-03-10 VITALS — BP 110/68 | HR 83 | Temp 97.7°F | Ht 69.0 in | Wt 162.0 lb

## 2017-03-10 DIAGNOSIS — R21 Rash and other nonspecific skin eruption: Secondary | ICD-10-CM | POA: Diagnosis not present

## 2017-03-10 MED ORDER — FLUCONAZOLE 150 MG PO TABS: 150.0000 mg | ORAL_TABLET | ORAL | 0 refills | Status: DC

## 2017-03-10 MED ORDER — NYSTATIN 100000 UNIT/GM EX CREA: 1.0000 "application " | TOPICAL_CREAM | Freq: Two times a day (BID) | CUTANEOUS | 1 refills | Status: DC

## 2017-03-10 NOTE — Progress Notes (Signed)
Assessment and Plan:  Cameron Mcclain was seen today for rash.  Diagnoses and all orders for this visit:  Scrotal rash Appears fungal in origin Discussed general hygiene/care recommendations (keep area dry, clean with gentle soap and water, wear cotton boxers and expose to air as much as possible), information provided on AVS to wife -     fluconazole (DIFLUCAN) 150 MG tablet; Take 1 tablet (150 mg total) by mouth once a week x 3 weeks -     nystatin cream (MYCOSTATIN); Apply 1 application topically 2 (two) times daily. Contact office if symptoms fail to improve or with new symptoms or concerns   Further disposition pending results of labs. Discussed med's effects and SE's.   Over 15 minutes of exam, counseling, chart review, and critical decision making was performed.   Future Appointments  Date Time Provider Department Center  03/20/2017 10:30 AM Judd Gaudierorbett, Adolphus Hanf, NP [facility] None  06/18/2017 10:00 AM Lucky CowboyMcKeown, [name], MD [facility] None    ------------------------------------------------------------------------------------------------------------------   HPI BP 110/68   Pulse 83   Temp 97.7 F (36.5 C)   Ht 5\' 9"  (1.753 m)   Wt 162 lb (73.5 kg)   SpO2 97%   BMI 23.92 kg/m   76 y.o.male with Alzheimer's type dementia presents accompanied by his wife for rash to Left groin/scrotum x 1.5 weeks. He endorses some mild itching/burning to area. Has been applying OTC antibiotic cream without improvement. Denies other symptoms; fever/chills, N/V/D/abdominal pain, headaches, myalgias/arthralgias, fatigue, malaise.   No history of diabetes, immunocompromise or frequent infections.    Past Medical History:  Diagnosis Date  . Glaucoma   . OSA (obstructive sleep apnea)   . RLS (restless legs syndrome)   . Vitamin D deficiency      Allergies  Allergen Reactions  . Benadryl [Diphenhydramine Hcl] Other (See Comments)    Hallucinations, itching, shaking    Current Outpatient Medications  on File Prior to Visit  Medication Sig  . Cholecalciferol (VITAMIN D) 2000 UNITS tablet Take 1 tablet (2,000 Units total) by mouth daily.  Marland Kitchen. ezetimibe (ZETIA) 10 MG tablet Take 1 tablet daily for Cholesterol ( Please continue Gemfibrozil )  . gemfibrozil (LOPID) 600 MG tablet Take 1 tablet (600 mg total) by mouth 2 (two) times daily before a meal.  . latanoprost (XALATAN) 0.005 % ophthalmic solution Place 1 drop into both eyes at bedtime.   Marland Kitchen. loratadine (CLARITIN) 10 MG tablet Take 10 mg by mouth daily.  Marland Kitchen. MAGNESIUM CITRATE PO Take by mouth.  . Albuterol Sulfate (PROAIR RESPICLICK) 108 (90 Base) MCG/ACT AEPB Inhale 1 Inhaler into the lungs 3 (three) times daily as needed. (Patient not taking: Reported on 03/10/2017)  . B Complex-C (SUPER B COMPLEX PO) Take by mouth daily.  . Calcium-Magnesium-Vitamin D (CALCIUM MAGNESIUM PO) Take 1 tablet by mouth daily.  . Omega-3 Fatty Acids (FISH OIL) 1000 MG CAPS Take 1 capsule (1,000 mg total) by mouth 2 (two) times daily. (Patient not taking: Reported on 03/10/2017)   No current facility-administered medications on file prior to visit.     ROS: all negative except above.   Physical Exam:  BP 110/68   Pulse 83   Temp 97.7 F (36.5 C)   Ht 5\' 9"  (1.753 m)   Wt 162 lb (73.5 kg)   SpO2 97%   BMI 23.92 kg/m   General Appearance: Well nourished, in no apparent distress. Respiratory: Respiratory effort normal. Cardio: RRR with no MRGs. Brisk peripheral pulses without edema.  Abdomen: Soft, +  BS.  Non-tender.  Lymphatics: Non tender without lymphadenopathy.  Musculoskeletal: normal gait.  Skin: Warm, dry; red/inflamed area to Left side of groin/scrotum without distinct lesions or discharge Psych: Awake and oriented, normal affect, Insight and Judgment appropriate.    Dan Maker, NP 5:52 PM [facility]

## 2017-03-10 NOTE — Patient Instructions (Addendum)
Wash with gentle soap and warm  - use aveeno, dove, baby soap, etc   Keep area as dry as possible - cotton loose boxers  Diflucan orally for 3 weeks  Topical cream twice daily until resolves - may take a few weeks  Call if worsening/not improving   Diflucan once weekly with food for 3 weeks.    Rash A rash is a change in the color of the skin. A rash can also change the way your skin feels. There are many different conditions and factors that can cause a rash. Follow these instructions at home: Pay attention to any changes in your symptoms. Follow these instructions to help with your condition: Medicine Take or apply over-the-counter and prescription medicines only as told by your health care provider. These may include:  Corticosteroid cream.  Anti-itch lotions.  Oral antihistamines.  Skin Care  Apply cool compresses to the affected areas.  Try taking a bath with: ? Epsom salts. Follow the instructions on the packaging. You can get these at your local pharmacy or grocery store. ? Baking soda. Pour a small amount into the bath as told by your health care provider. ? Colloidal oatmeal. Follow the instructions on the packaging. You can get this at your local pharmacy or grocery store.  Try applying baking soda paste to your skin. Stir water into baking soda until it reaches a paste-like consistency.  Do not scratch or rub your skin.  Avoid covering the rash. Make sure the rash is exposed to air as much as possible. General instructions  Avoid hot showers or baths, which can make itching worse. A cold shower may help.  Avoid scented soaps, detergents, and perfumes. Use gentle soaps, detergents, perfumes, and other cosmetic products.  Avoid any substance that causes your rash. Keep a journal to help track what causes your rash. Write down: ? What you eat. ? What cosmetic products you use. ? What you drink. ? What you wear. This includes jewelry.  Keep all follow-up  visits as told by your health care provider. This is important. Contact a health care provider if:  You sweat at night.  You lose weight.  You urinate more than normal.  You feel weak.  You vomit.  Your skin or the whites of your eyes look yellow (jaundice).  Your skin: ? Tingles. ? Is numb.  Your rash: ? Does not go away after several days. ? Gets worse.  You are: ? Unusually thirsty. ? More tired than normal.  You have: ? New symptoms. ? Pain in your abdomen. ? A fever. ? Diarrhea. Get help right away if:  You develop a rash that covers all or most of your body. The rash may or may not be painful.  You develop blisters that: ? Are on top of the rash. ? Grow larger or grow together. ? Are painful. ? Are inside your nose or mouth.  You develop a rash that: ? Looks like purple pinprick-sized spots all over your body. ? Has a "bull's eye" or looks like a target. ? Is not related to sun exposure, is red and painful, and causes your skin to peel. This information is not intended to replace advice given to you by your health care provider. Make sure you discuss any questions you have with your health care provider. Document Released: 01/11/2002 Document Revised: 06/27/2015 Document Reviewed: 06/08/2014 Elsevier Interactive Patient Education  2018 Else[employer] Inc[employer]    Fluconazole tablets What is this medicine? FLUCONAZOLE (floo KON  na zole) is an antifungal medicine. It is used to treat certain kinds of fungal or yeast infections. This medicine may be used for other purposes; ask your health care provider or pharmacist if you have questions. COMMON BRAND NAME(S): Diflucan What should I tell my health care provider before I take this medicine? They need to know if you have any of these conditions: -history of irregular heart beat -kidney disease -an unusual or allergic reaction to fluconazole, other azole antifungals, medicines, foods, dyes, or  preservatives -pregnant or trying to get pregnant -breast-feeding How should I use this medicine? Take this medicine by mouth. Follow the directions on the prescription label. Do not take your medicine more often than directed. Talk to your pediatrician regarding the use of this medicine in children. Special care may be needed. This medicine has been used in children as young as 52 months of age. Overdosage: If you think you have taken too much of this medicine contact a poison control center or emergency room at once. NOTE: This medicine is only for you. Do not share this medicine with others. What if I miss a dose? If you miss a dose, take it as soon as you can. If it is almost time for your next dose, take only that dose. Do not take double or extra doses. What may interact with this medicine? Do not take this medicine with any of the following medications: -astemizole -certain medicines for irregular heart beat like dofetilide, dronedarone, quinidine -cisapride -erythromycin -lomitapide -other medicines that prolong the QT interval (cause an abnormal heart rhythm) -pimozide -terfenadine -thioridazine -tolvaptan -ziprasidone This medicine may also interact with the following medications: -antiviral medicines for HIV or AIDS -birth control pills -certain antibiotics like rifabutin, rifampin -certain medicines for blood pressure like amlodipine, isradipine, felodipine, hydrochlorothiazide, losartan, nifedipine -certain medicines for cancer like cyclophosphamide, vinblastine, vincristine -certain medicines for cholesterol like atorvastatin, lovastatin, fluvastatin, simvastatin -certain medicines for depression, anxiety, or psychotic disturbances like amitriptyline, midazolam, nortriptyline, triazolam -certain medicines for diabetes like glipizide, glyburide, tolbutamide -certain medicines for pain like alfentanil, fentanyl, methadone -certain medicines for seizures like carbamazepine,  phenytoin -certain medicines that treat or prevent blood clots like warfarin -halofantrine -medicines that lower your chance of fighting infection like cyclosporine, prednisone, tacrolimus -NSAIDS, medicines for pain and inflammation, like celecoxib, diclofenac, flurbiprofen, ibuprofen, meloxicam, naproxen -other medicines for fungal infections -sirolimus -theophylline -tofacitinib This list may not describe all possible interactions. Give your health care provider a list of all the medicines, herbs, non-prescription drugs, or dietary supplements you use. Also tell them if you smoke, drink alcohol, or use illegal drugs. Some items may interact with your medicine. What should I watch for while using this medicine? Visit your doctor or health care professional for regular checkups. If you are taking this medicine for a long time you may need blood work. Tell your doctor if your symptoms do not improve. Some fungal infections need many weeks or months of treatment to cure. Alcohol can increase possible damage to your liver. Avoid alcoholic drinks. If you have a vaginal infection, do not have sex until you have finished your treatment. You can wear a sanitary napkin. Do not use tampons. Wear freshly washed cotton, not synthetic, panties. What side effects may I notice from receiving this medicine? Side effects that you should report to your doctor or health care professional as soon as possible: -allergic reactions like skin rash or itching, hives, swelling of the lips, mouth, tongue, or throat -dark urine -feeling dizzy  or faint -irregular heartbeat or chest pain -redness, blistering, peeling or loosening of the skin, including inside the mouth -trouble breathing -unusual bruising or bleeding -vomiting -yellowing of the eyes or skin Side effects that usually do not require medical attention (report to your doctor or health care professional if they continue or are bothersome): -changes in how  food tastes -diarrhea -headache -stomach upset or nausea This list may not describe all possible side effects. Call your doctor for medical advice about side effects. You may report side effects to FDA at 1-800-FDA-1088. Where should I keep my medicine? Keep out of the reach of children. Store at room temperature below 30 degrees C (86 degrees F). Throw away any medicine after the expiration date. NOTE: This sheet is a summary. It may not cover all possible information. If you have questions about this medicine, talk to your doctor, pharmacist, or health care provider.  2018 Elsevier/Gold Standard (2012-08-29 19:37:38)

## 2017-03-19 DIAGNOSIS — Z0000 Encounter for general adult medical examination without abnormal findings: Secondary | ICD-10-CM | POA: Insufficient documentation

## 2017-03-19 NOTE — Progress Notes (Signed)
MEDICARE ANNUAL WELLNESS VISIT AND FOLLOW UP Assessment:    Encounter for Medicare annual wellness exam  Essential hypertension Continue medication Monitor blood pressure at home; call if consistently over 130/80 Continue DASH diet.   Reminder to go to the ER if any CP, SOB, nausea, dizziness, severe HA, changes vision/speech, left arm numbness and tingling and jaw pain.  Chronic obstructive pulmonary disease, unspecified COPD type (HCC) Emphysema on imaging; stable without inhalers. Continue to monitor  OSA (obstructive sleep apnea) Previously on CPAP; hasn't used in years. No observed apnea by wife, ? Need after weight loss.    SDAT (senile dementia of Alzheimer's type) Continue to monitor -  Balance is declining, discussed PT, declines No longer treated by namenda/aricept  BMI 23.0-23.9, adult  Glaucoma, unspecified glaucoma type, unspecified laterality Continue follow up with ophthalmology  Hyperlipidemia Continue medications Continue low cholesterol diet and exercise.   -     Lipid panel -     TSH  Medication management -     CBC with Differential/Platelet -     BASIC METABOLIC PANEL WITH GFR -     Hepatic function panel  Other abnormal glucose -     BASIC METABOLIC PANEL WITH GFR  Vitamin D deficiency Continue supplementation for goal of 70-100 At goal at recent check; defer vitamin D level  Need for colon cancer screening Was recommended for 5 year follow up after colonoscopy in 2008 - never followed up. Discussed referral today, whether would pursue treatment, requests to postpone and discuss with Dr. Oneta Rack. Risks of not pursuing discussed at length and recommended at least referral and discussion with GI for + hx of serrated adenoma and hyperplastic polyps.    Over 30 minutes of exam, counseling, chart review, and critical decision making was performed  Future Appointments  Date Time Provider Department Center  06/18/2017 10:00 AM Lucky Cowboy, MD  [facility] None  03/25/2018 10:30 AM Judd Gaudier, NP [facility] None    Plan:   During the course of the visit the patient was educated and counseled about appropriate screening and preventive services including:    Pneumococcal vaccine   Influenza vaccine  Prevnar 13  Td vaccine  Screening electrocardiogram  Colorectal cancer screening  Diabetes screening  Glaucoma screening  Nutrition counseling    Subjective:  Cameron Mcclain is a 77 y.o. male who presents for Medicare Annual Wellness Visit and 3 month follow up for HTN, hyperlipidemia, glucose management, and vitamin D Def. He has SDAT previously treated by namenda/aricept but this was discontinued for lack of perceived benefit and ?SE. Ongoing need for CPAP for OSA since weight loss has been questioned; wife has been monitoring without observed episodes of apnea.   BMI is Body mass index is 22.74 kg/m., he has not been working on diet and exercise. Wt Readings from Last 3 Encounters:  03/20/17 154 lb (69.9 kg)  03/10/17 162 lb (73.5 kg)  12/16/16 152 lb 6.4 oz (69.1 kg)   His blood pressure has been controlled at home, today their BP is BP: 106/64 He does not workout. He denies chest pain, shortness of breath, dizziness.   He is on cholesterol medication and denies myalgias. His cholesterol is not at goal. The cholesterol last visit was:   Lab Results  Component Value Date   CHOL 222 (H) 12/16/2016   HDL 66 12/16/2016   LDLCALC 121 (H) 09/11/2016   TRIG 79 12/16/2016   CHOLHDL 3.4 12/16/2016   He has not been working  on diet and exercise for glucose management, and denies increased appetite, nausea, paresthesia of the feet, polydipsia, polyuria, visual disturbances, vomiting and weight loss. Last A1C in the office was:  Lab Results  Component Value Date   HGBA1C 5.2 12/16/2016   Last GFR Lab Results  Component Value Date   GFRNONAA 80 12/16/2016    Patient is on Vitamin D supplement and at goal as  last check:    Lab Results  Component Value Date   VD25OH 79 12/16/2016      Medication Review: Current Outpatient Medications on File Prior to Visit  Medication Sig Dispense Refill  . B Complex-C (SUPER B COMPLEX PO) Take by mouth daily.    . Calcium-Magnesium-Vitamin D (CALCIUM MAGNESIUM PO) Take 1 tablet by mouth daily.    . Cholecalciferol (VITAMIN D) 2000 UNITS tablet Take 1 tablet (2,000 Units total) by mouth daily. (Patient taking differently: Take 4,000 Units by mouth daily. )    . ezetimibe (ZETIA) 10 MG tablet Take 1 tablet daily for Cholesterol ( Please continue Gemfibrozil ) 90 tablet 1  . fluconazole (DIFLUCAN) 150 MG tablet Take 1 tablet (150 mg total) by mouth once a week. 3 tablet 0  . gemfibrozil (LOPID) 600 MG tablet Take 1 tablet (600 mg total) by mouth 2 (two) times daily before a meal. 60 tablet 11  . latanoprost (XALATAN) 0.005 % ophthalmic solution Place 1 drop into both eyes at bedtime.     Marland Kitchen. loratadine (CLARITIN) 10 MG tablet Take 10 mg by mouth daily.    Marland Kitchen. MAGNESIUM CITRATE PO Take by mouth.    . nystatin cream (MYCOSTATIN) Apply 1 application topically 2 (two) times daily. 30 g 1  . Omega-3 Fatty Acids (FISH OIL) 1000 MG CAPS Take 1 capsule (1,000 mg total) by mouth 2 (two) times daily.  0  . Albuterol Sulfate (PROAIR RESPICLICK) 108 (90 Base) MCG/ACT AEPB Inhale 1 Inhaler into the lungs 3 (three) times daily as needed. (Patient not taking: Reported on 03/20/2017) 1 each 3   No current facility-administered medications on file prior to visit.     Allergies: Allergies  Allergen Reactions  . Benadryl [Diphenhydramine Hcl] Other (See Comments)    Hallucinations, itching, shaking    Current Problems (verified) has Essential hypertension; Hyperlipidemia; Other abnormal glucose; Vitamin D deficiency; SDAT (senile dementia of Alzheimer's type); Medication management; Glaucoma; OSA (obstructive sleep apnea); BMI 23.0-23.9, adult; Chronic obstructive pulmonary  disease (HCC); and Encounter for Medicare annual wellness exam on their problem list.  Screening Tests Immunization History  Administered Date(s) Administered  . Influenza Whole 11/23/2012  . Influenza, High Dose Seasonal PF 11/23/2013, 10/19/2014, 10/26/2015, 11/05/2016  . Pneumococcal Conjugate-13 01/12/2014  . Pneumococcal Polysaccharide-23 12/02/2008  . Td 09/26/2010  . Zoster 02/25/2008   Preventative care: Last colonoscopy: 2008, - overdue for 5 year follow, declines referral at this time, wants to discuss with Dr. Oneta RackMckeown.  CT head 2013 atrophy CT angio chest 2015 - emphysema  Prior vaccinations: TD or Tdap: 2012  Influenza: 2016  Pneumococcal: 2010 Prevnar13: 2015 Shingles/Zostavax: 2010  Names of Other Physician/Practitioners you currently use: 1. [facility] here for primary care 2. Dr. Emily FilbertGould, eye doctor, last visit 2018 3. Dr. Sharma CovertNorman, q 6 months  Patient Care Team: Lucky CowboyMcKeown, [name], MD as PCP - General (Internal Medicine)  Surgical: He  has a past surgical history that includes Pilonidal cyst / sinus excision (1959) and axillary (Right, 1986). Family His family history includes Cancer in  his father; Diabetes in his mother; Hypertension in his father and mother. Social history  He reports that he quit smoking about 7 years ago. he has never used smokeless tobacco. He reports that he drinks alcohol. He reports that he does not use drugs.  MEDICARE WELLNESS OBJECTIVES: Physical activity: Current Exercise Habits: The patient does not participate in regular exercise at present Cardiac risk factors:   Depression/mood screen:   Depression screen [facility] 2/9 03/20/2017  Decreased Interest 0  Down, Depressed, Hopeless 0  PHQ - 2 Score 0    ADLs:  In your present state of health, do you have any difficulty performing the following activities: 03/20/2017 12/16/2016  Hearing? Y N  Vision? N N  Difficulty concentrating or making  decisions? Y N  Comment - -  Walking or climbing stairs? N N  Dressing or bathing? N N  Doing errands, shopping? Y N  Comment - -  Quarry manager and eating ? Y -  Comment Doesn't cook anymore -  Using the Toilet? N -  In the past six months, have you accidently leaked urine? N -  Do you have problems with loss of bowel control? N -  Managing your Medications? Y -  Comment By wife -  Managing your Finances? Y -  Comment Wife -  Housekeeping or managing your Housekeeping? Y -  Comment Wife -  Some recent data might be hidden     Cognitive Testing  Alert? Yes  Normal Appearance?Yes  Oriented to person? Yes  Place? Yes   Time? Yes  Recall of three objects?  No - cannot recall 0/3 on immediate recall  Can perform simple calculations? Yes  Displays appropriate judgment?Yes  Can read the correct time from a watch face?Yes  EOL planning: Does Patient Have a Medical Advance Directive?: Yes Type of Advance Directive: Healthcare Power of Attorney, Living will Does patient want to make changes to medical advance directive?: No - Patient declined Copy of Healthcare Power of Attorney in Chart?: Yes   Objective:   Today's Vitals   03/20/17 1033  BP: 106/64  Pulse: 83  Temp: 97.7 F (36.5 C)  SpO2: 96%  Weight: 154 lb (69.9 kg)  Height: 5\' 9"  (1.753 m)   Body mass index is 22.74 kg/m.  General appearance: alert, no distress, WD/WN, male HEENT: normocephalic, sclerae anicteric, TMs pearly, nares patent, no discharge or erythema, pharynx normal Oral cavity: MMM, no lesions Neck: supple, no lymphadenopathy, no thyromegaly, no masses Heart: RRR, normal S1, S2, no murmurs Lungs: CTA bilaterally, no wheezes, rhonchi, or rales Abdomen: +bs, soft, non tender, non distended, no masses, no hepatomegaly, no splenomegaly Musculoskeletal: nontender, no swelling, no obvious deformity Extremities: no edema, no cyanosis, no clubbing Pulses: 2+ symmetric, upper and lower extremities,  normal cap refill Neurological: alert, oriented x 3, CN2-12 intact, strength normal upper extremities and lower extremities, sensation normal throughout, DTRs 2+ throughout, no cerebellar signs, gait normal Psychiatric: normal affect, behavior normal, pleasant, short term memory extremely poor- 0/3 word on immediate recall, insight appropriate  Medicare Attestation I have personally reviewed: The patient's medical and social history Their use of alcohol, tobacco or illicit drugs Their current medications and supplements The patient's functional ability including ADLs,fall risks, home safety risks, cognitive, and hearing and visual impairment Diet and physical activities Evidence for depression or mood disorders  The patient's weight, height, BMI, and visual acuity have been recorded in the chart.  I have made referrals, counseling, and provided education to  the patient based on review of the above and I have provided the patient with a written personalized care plan for preventive services.     Dan Maker, NP   03/20/2017

## 2017-03-20 ENCOUNTER — Ambulatory Visit (INDEPENDENT_AMBULATORY_CARE_PROVIDER_SITE_OTHER): Payer: PPO | Admitting: Adult Health

## 2017-03-20 ENCOUNTER — Encounter: Payer: Self-pay | Admitting: Adult Health

## 2017-03-20 VITALS — BP 106/64 | HR 83 | Temp 97.7°F | Ht 69.0 in | Wt 154.0 lb

## 2017-03-20 DIAGNOSIS — R6889 Other general symptoms and signs: Secondary | ICD-10-CM

## 2017-03-20 DIAGNOSIS — R7309 Other abnormal glucose: Secondary | ICD-10-CM | POA: Diagnosis not present

## 2017-03-20 DIAGNOSIS — F0280 Dementia in other diseases classified elsewhere without behavioral disturbance: Secondary | ICD-10-CM | POA: Diagnosis not present

## 2017-03-20 DIAGNOSIS — J449 Chronic obstructive pulmonary disease, unspecified: Secondary | ICD-10-CM

## 2017-03-20 DIAGNOSIS — Z79899 Other long term (current) drug therapy: Secondary | ICD-10-CM | POA: Diagnosis not present

## 2017-03-20 DIAGNOSIS — Z0001 Encounter for general adult medical examination with abnormal findings: Secondary | ICD-10-CM

## 2017-03-20 DIAGNOSIS — H409 Unspecified glaucoma: Secondary | ICD-10-CM

## 2017-03-20 DIAGNOSIS — G4733 Obstructive sleep apnea (adult) (pediatric): Secondary | ICD-10-CM | POA: Diagnosis not present

## 2017-03-20 DIAGNOSIS — E782 Mixed hyperlipidemia: Secondary | ICD-10-CM

## 2017-03-20 DIAGNOSIS — E559 Vitamin D deficiency, unspecified: Secondary | ICD-10-CM | POA: Diagnosis not present

## 2017-03-20 DIAGNOSIS — M790 Rheumatism, unspecified: Secondary | ICD-10-CM

## 2017-03-20 DIAGNOSIS — M797 Fibromyalgia: Secondary | ICD-10-CM | POA: Diagnosis not present

## 2017-03-20 DIAGNOSIS — G301 Alzheimer's disease with late onset: Secondary | ICD-10-CM

## 2017-03-20 DIAGNOSIS — Z6823 Body mass index (BMI) 23.0-23.9, adult: Secondary | ICD-10-CM

## 2017-03-20 DIAGNOSIS — I10 Essential (primary) hypertension: Secondary | ICD-10-CM

## 2017-03-20 DIAGNOSIS — Z0000 Encounter for general adult medical examination without abnormal findings: Secondary | ICD-10-CM

## 2017-03-20 LAB — CBC WITH DIFFERENTIAL/PLATELET
Basophils Absolute: 108 cells/uL (ref 0–200)
Basophils Relative: 1.4 %
EOS PCT: 5.2 %
Eosinophils Absolute: 400 cells/uL (ref 15–500)
HCT: 44.5 % (ref 38.5–50.0)
Hemoglobin: 15.6 g/dL (ref 13.2–17.1)
Lymphs Abs: 3442 cells/uL (ref 850–3900)
MCH: 32.6 pg (ref 27.0–33.0)
MCHC: 35.1 g/dL (ref 32.0–36.0)
MCV: 93.1 fL (ref 80.0–100.0)
MONOS PCT: 6.9 %
MPV: 10.7 fL (ref 7.5–12.5)
NEUTROS PCT: 41.8 %
Neutro Abs: 3219 cells/uL (ref 1500–7800)
PLATELETS: 272 10*3/uL (ref 140–400)
RBC: 4.78 10*6/uL (ref 4.20–5.80)
RDW: 11.6 % (ref 11.0–15.0)
TOTAL LYMPHOCYTE: 44.7 %
WBC mixed population: 531 cells/uL (ref 200–950)
WBC: 7.7 10*3/uL (ref 3.8–10.8)

## 2017-03-20 LAB — HEPATIC FUNCTION PANEL
AG Ratio: 2 (calc) (ref 1.0–2.5)
ALBUMIN MSPROF: 4.7 g/dL (ref 3.6–5.1)
ALT: 11 U/L (ref 9–46)
AST: 19 U/L (ref 10–35)
Alkaline phosphatase (APISO): 65 U/L (ref 40–115)
BILIRUBIN DIRECT: 0.1 mg/dL (ref 0.0–0.2)
Globulin: 2.4 g/dL (calc) (ref 1.9–3.7)
Indirect Bilirubin: 0.5 mg/dL (calc) (ref 0.2–1.2)
Total Bilirubin: 0.6 mg/dL (ref 0.2–1.2)
Total Protein: 7.1 g/dL (ref 6.1–8.1)

## 2017-03-20 LAB — BASIC METABOLIC PANEL WITH GFR
BUN: 18 mg/dL (ref 7–25)
CALCIUM: 10 mg/dL (ref 8.6–10.3)
CHLORIDE: 102 mmol/L (ref 98–110)
CO2: 29 mmol/L (ref 20–32)
Creat: 1.1 mg/dL (ref 0.70–1.18)
GFR, Est African American: 76 mL/min/{1.73_m2} (ref >=60)
GFR, Est Non African American: 65 mL/min/{1.73_m2} (ref >=60)
Glucose, Bld: 87 mg/dL (ref 65–99)
POTASSIUM: 6.1 mmol/L — AB (ref 3.5–5.3)
Sodium: 136 mmol/L (ref 135–146)

## 2017-03-20 LAB — LIPID PANEL
CHOLESTEROL: 191 mg/dL (ref <200)
HDL: 51 mg/dL (ref >40)
LDL CHOLESTEROL (CALC): 118 mg/dL — AB
Non-HDL Cholesterol (Calc): 140 mg/dL (calc) — ABNORMAL HIGH (ref <130)
Total CHOL/HDL Ratio: 3.7 (calc) (ref <5.0)
Triglycerides: 108 mg/dL (ref <150)

## 2017-03-20 LAB — TSH: TSH: 0.94 mIU/L (ref 0.40–4.50)

## 2017-03-20 NOTE — Patient Instructions (Signed)
Dementia Caregiver Guide Dementia is a term used to describe a number of symptoms that affect memory and thinking. The most common symptoms include:  Memory loss.  Trouble with language and communication.  Trouble concentrating.  Poor judgment.  Problems with reasoning.  Child-like behavior and language.  Extreme anxiety.  Angry outbursts.  Wandering from home or public places.  Dementia usually gets worse slowly over time. In the early stages, people with dementia can stay independent and safe with some help. In later stages, they need help with daily tasks such as dressing, grooming, and using the bathroom. How to help the person with dementia cope Dementia can be frightening and confusing. Here are some tips to help the person with dementia cope with changes caused by the disease. General tips  Keep the person on track with his or her routine.  Try to identify areas where the person may need help.  Be supportive, patient, calm, and encouraging.  Gently remind the person that adjusting to changes takes time.  Help with the tasks that the person has asked for help with.  Keep the person involved in daily tasks and decisions as much as possible.  Encourage conversation, but try not to get frustrated or harried if the person struggles to find words or does not seem to appreciate your help. Communication tips  When the person is talking or seems frustrated, make eye contact and hold the person's hand.  Ask specific questions that need yes or no answers.  Use simple words, short sentences, and a calm voice. Only give one direction at a time.  When offering choices, limit them to just 1 or 2.  Avoid correcting the person in a negative way.  If the person is struggling to find the right words, gently try to help him or her. How to recognize symptoms of stress Symptoms of stress in caregivers include:  Feeling frustrated or angry with the person with  dementia.  Denying that the person has dementia or that his or her symptoms will not improve.  Feeling hopeless and unappreciated.  Difficulty sleeping.  Difficulty concentrating.  Feeling anxious, irritable, or depressed.  Developing stress-related health problems.  Feeling like you have too little time for your own life.  Follow these instructions at home:  Make sure that you and the person you are caring for: ? Get regular sleep. ? Exercise regularly. ? Eat regular, nutritious meals. ? Drink enough fluid to keep your urine clear or pale yellow. ? Take over-the-counter and prescription medicines only as told by your health care providers. ? Attend all scheduled health care appointments.  Join a support group with others who are caregivers.  Ask about respite care resources so that you can have a regular break from the stress of caregiving.  Look for signs of stress in yourself and in the person you are caring for. If you notice signs of stress, take steps to manage it.  Consider any safety risks and take steps to avoid them.  Organize medications in a pill box for each day of the week.  Create a plan to handle any legal or financial matters. Get legal or financial advice if needed.  Keep a calendar in a central location to remind the person of appointments or other activities. Tips for reducing the risk of injury  Keep floors clear of clutter. Remove rugs, magazine racks, and floor lamps.  Keep hallways well lit, especially at night.  Put a handrail and nonslip mat in the bathtub   or shower.  Put childproof locks on cabinets that contain dangerous items, such as medicines, alcohol, guns, toxic cleaning items, sharp tools or utensils, matches, and lighters.  Put the locks in places where the person cannot see or reach them easily. This will help ensure that the person does not wander out of the house and get lost.  Be prepared for emergencies. Keep a list of  emergency phone numbers and addresses in a convenient area.  Remove car keys and lock garage doors so that the person does not try to get in the car and drive.  Have the person wear a bracelet that tracks locations and identifies the person as having memory problems. This should be worn at all times for safety. Where to find support: Many individuals and organizations offer support. These include:  Support groups for people with dementia and for caregivers.  Counselors or therapists.  Home health care services.  Adult day care centers.  Where to find more information: Alzheimer's Association: www.alz.org Contact a health care provider if:  The person's health is rapidly getting worse.  You are no longer able to care for the person.  Caring for the person is affecting your physical and emotional health.  The person threatens himself or herself, you, or anyone else. Summary  Dementia is a term used to describe a number of symptoms that affect memory and thinking.  Dementia usually gets worse slowly over time.  Take steps to reduce the person's risk of injury, and to plan for future care.  Caregivers need support, relief from caregiving, and time for their own lives. This information is not intended to replace advice given to you by your health care provider. Make sure you discuss any questions you have with your health care provider. Document Released: 12/26/2015 Document Revised: 12/26/2015 Document Reviewed: 12/26/2015 Elsevier Interactive Patient Education  2018 [employer]vier Inc.  

## 2017-03-27 ENCOUNTER — Other Ambulatory Visit: Payer: PPO

## 2017-03-27 DIAGNOSIS — Z79899 Other long term (current) drug therapy: Secondary | ICD-10-CM | POA: Diagnosis not present

## 2017-03-27 LAB — BASIC METABOLIC PANEL WITH GFR
BUN: 15 mg/dL (ref 7–25)
CO2: 30 mmol/L (ref 20–32)
CREATININE: 0.83 mg/dL (ref 0.70–1.18)
Calcium: 9.5 mg/dL (ref 8.6–10.3)
Chloride: 100 mmol/L (ref 98–110)
GFR, EST NON AFRICAN AMERICAN: 86 mL/min/{1.73_m2} (ref >=60)
GFR, Est African American: 100 mL/min/{1.73_m2} (ref >=60)
Glucose, Bld: 82 mg/dL (ref 65–99)
Potassium: 4.1 mmol/L (ref 3.5–5.3)
SODIUM: 136 mmol/L (ref 135–146)

## 2017-05-19 ENCOUNTER — Ambulatory Visit: Payer: Self-pay | Admitting: Internal Medicine

## 2017-05-26 DIAGNOSIS — H401131 Primary open-angle glaucoma, bilateral, mild stage: Secondary | ICD-10-CM | POA: Diagnosis not present

## 2017-05-26 DIAGNOSIS — H2513 Age-related nuclear cataract, bilateral: Secondary | ICD-10-CM | POA: Diagnosis not present

## 2017-06-17 NOTE — Progress Notes (Signed)
[facility]   Lucky Cowboy, M.D.     Dyanne Carrel. Steffanie Dunn, P.A.-C Judd Gaudier, DNP [facility]                [address]                [place], [place]. [address] Telephone [phone] Telefax [phone] Annual  Screening/Preventative Visit  & Comprehensive Evaluation & Examination     This very nice 77 y.o. MWM  presents for a Screening/Preventative Visit & comprehensive evaluation and management of multiple medical co-morbidities.  Patient has been followed for HTN, HLD, Prediabetes, Vascular Dementia  and Vitamin D Deficiency. Patient has hx/o OSA but was intolerant of his masks.      Patient has had a progressive Dementia for several years and wife has discontinued his Aricept & Namenda for concern of perceived side-effects and lack of perceived benefits.  Patient has a very short term recall, but does remain independent in personal ADL's w/ supervision and guidance by his wife. Head CT scan in 2013 showed atrophy.      HTN predates circa 1999. Patient's BP has been controlled at home.  Today's BP is at goal - 110/74. Patient denies any cardiac symptoms as chest pain, palpitations, shortness of breath, dizziness or ankle swelling.     Patient's hyperlipidemia is not controlled with diet and wife has d/c'd his Statin medication for concern of possible SE on memory and cognitive function, but he is taking Gemfibrozil 9 but off his Zetia per wife's recommendations). Patient denies myalgias or other medication SE's. Last lipids were not at goal; Lab Results  Component Value Date   CHOL 196 06/18/2017   HDL 49 06/18/2017   LDLCALC 121 (H) 06/18/2017   TRIG 148 06/18/2017   CHOLHDL 4.0 06/18/2017      Patient has prediabetes (A1c 5.8%/2011) and patient denies reactive hypoglycemic symptoms, visual blurring, diabetic polys or paresthesias. Last A1c was normal & at goal: Lab Results  Component Value Date   HGBA1C  5.2 12/16/2016       Finally, patient has history of Vitamin D Deficiency ("24"/2008) and last vitamin D was at goal: Lab Results  Component Value Date   VD25OH 79 12/16/2016   Current Outpatient Medications on File Prior to Visit  Medication Sig  . Albuterol Sulfate (PROAIR RESPICLICK) 108 (90 Base) MCG/ACT AEPB Inhale 1 Inhaler into the lungs 3 (three) times daily as needed.  . B Complex-C (SUPER B COMPLEX PO) Take by mouth daily.  Marland Kitchen gemfibrozil (LOPID) 600 MG tablet Take 1 tablet (600 mg total) by mouth 2 (two) times daily before a meal.  . latanoprost (XALATAN) 0.005 % ophthalmic solution Place 1 drop into both eyes at bedtime.   Marland Kitchen loratadine (CLARITIN) 10 MG tablet Take 10 mg by mouth daily.  Marland Kitchen MAGNESIUM CITRATE PO Take by mouth.  . Omega-3 Fatty Acids (FISH OIL) 1000 MG CAPS Take 1 capsule (1,000 mg total) by mouth 2 (two) times daily.  Marland Kitchen ezetimibe (ZETIA) 10 MG tablet Take 1 tablet daily for Cholesterol ( Please continue Gemfibrozil ) (Patient not taking: Reported on 06/18/2017)   No current facility-administered medications on file prior to visit.    Allergies  Allergen Reactions  . Benadryl [Diphenhydramine Hcl] Other (See Comments)    Hallucinations, itching, shaking   Past Medical History:  Diagnosis Date  . Glaucoma   . OSA (obstructive sleep apnea)   . RLS (restless legs  syndrome)   . Vitamin D deficiency    Health Maintenance  Topic Date Due  . INFLUENZA VACCINE  09/04/2017  . TETANUS/TDAP  09/25/2020  . PNA vac Low Risk Adult  Completed   Immunization History  Administered Date(s) Administered  . Influenza Whole 11/23/2012  . Influenza, High Dose Seasonal PF 11/23/2013, 10/19/2014, 10/26/2015, 11/05/2016  . Pneumococcal Conjugate-13 01/12/2014  . Pneumococcal Polysaccharide-23 12/02/2008  . Td 09/26/2010  . Zoster 02/25/2008   Last Colon -  10/23/2006 - Dr Arlyce Dice - recc no f/u   Past Surgical History:  Procedure Laterality Date  . axillary Right 1986    biopsy of axillary nodes  . PILONIDAL CYST / SINUS EXCISION  1959   Family History  Problem Relation Age of Onset  . Hypertension Mother   . Diabetes Mother   . Hypertension Father   . Cancer Father    Social History   Socioeconomic History  . Marital status: Married x 2 - now for 35 years    Spouse name: Marge  . Number of children: None  Occupational History  . Former Designer, industrial/product  Tobacco Use  . Smoking status: Former Smoker    Last attempt to quit: 05/10/2009    Years since quitting: 8.1  . Smokeless tobacco: Never Used  Substance and Sexual Activity  . Alcohol use: Yes    Alcohol/week: 0.0 oz  . Drug use: No  . Sexual activity: Not on file    ROS Constitutional: Denies fever, chills, weight loss/gain, headaches, insomnia,  night sweats or change in appetite. Does c/o fatigue. Eyes: Denies redness, blurred vision, diplopia, discharge, itchy or watery eyes.  ENT: Denies discharge, congestion, post nasal drip, epistaxis, sore throat, earache, hearing loss, dental pain, Tinnitus, Vertigo, Sinus pain or snoring.  Cardio: Denies chest pain, palpitations, irregular heartbeat, syncope, dyspnea, diaphoresis, orthopnea, PND, claudication or edema Respiratory: denies cough, dyspnea, DOE, pleurisy, hoarseness, laryngitis or wheezing.  Gastrointestinal: Denies dysphagia, heartburn, reflux, water brash, pain, cramps, nausea, vomiting, bloating, diarrhea, constipation, hematemesis, melena, hematochezia, jaundice or hemorrhoids Genitourinary: Denies dysuria, frequency, urgency, nocturia, hesitancy, discharge, hematuria or flank pain Musculoskeletal: Denies arthralgia, myalgia, stiffness, Jt. Swelling, pain, limp or strain/sprain. Denies Falls. Skin: Denies puritis, rash, hives, warts, acne, eczema or change in skin lesion Neuro: No weakness, tremor, incoordination, spasms, paresthesia or pain Psychiatric: Denies confusion, memory loss or sensory loss. Denies  Depression. Endocrine: Denies change in weight, skin, hair change, nocturia, and paresthesia, diabetic polys, visual blurring or hyper / hypo glycemic episodes.  Heme/Lymph: No excessive bleeding, bruising or enlarged lymph nodes.  Physical Exam  BP 110/74   Pulse 76   Temp (!) 97.3 F (36.3 C)   Resp 16   Ht 5' 8.5" (1.74 m)   Wt 156 lb (70.8 kg)   BMI 23.37 kg/m   General Appearance: Well nourished and well groomed and in no apparent distress.  Eyes: PERRLA, EOMs, conjunctiva no swelling or erythema, normal fundi and vessels. Sinuses: No frontal/maxillary tenderness ENT/Mouth: EACs patent / TMs  nl. Nares clear without erythema, swelling, mucoid exudates. Oral hygiene is good. No erythema, swelling, or exudate. Tongue normal, non-obstructing. Tonsils not swollen or erythematous. Hearing normal.  Neck: Supple, thyroid not palpable. No bruits, nodes or JVD. Respiratory: Respiratory effort normal.  BS equal and clear bilateral without rales, rhonci, wheezing or stridor. Cardio: Heart sounds are normal with regular rate and rhythm and no murmurs, rubs or gallops. Peripheral pulses are normal and equal bilaterally without edema. No aortic  or femoral bruits. Chest: symmetric with normal excursions and percussion.  Abdomen: Soft, with Nl bowel sounds. Nontender, no guarding, rebound, hernias, masses, or organomegaly.  Lymphatics: Non tender without lymphadenopathy.  Genitourinary:  DRE - deferred. Musculoskeletal: Full ROM all peripheral extremities, joint stability, 5/5 strength, and normal gait. Skin: Warm and dry without rashes, lesions, cyanosis, clubbing or  ecchymosis.  Neuro: Cranial nerves intact, reflexes equal bilaterally. Normal muscle tone, no cerebellar symptoms. Sensation intact.  Pysch: Alert and pleasant . Very poor short term recall.   Assessment and Plan  1. Annual Preventative/Screening Exam   2. Essential hypertension  - EKG 12-Lead - [place], RETROPERITNL ABD,   LTD - Urinalysis, Routine w reflex microscopic - Microalbumin / creatinine urine ratio - CBC with Differential/Platelet - COMPLETE METABOLIC PANEL WITH GFR - Magnesium - TSH  3. Hyperlipidemia, mixed  - EKG 12-Lead - [place], RETROPERITNL ABD,  LTD - Lipid panel - TSH  4. Abnormal glucose  - EKG 12-Lead - [place], RETROPERITNL ABD,  LTD - Hemoglobin A1c - Insulin, random  5. Vitamin D deficiency  - VITAMIN D 25 Hydroxyl  6. Prediabetes  - EKG 12-Lead - [place], RETROPERITNL ABD,  LTD - Hemoglobin A1c - Insulin, random  7. SDAT (senile dementia of Alzheimer's type)  8. OSA (obstructive sleep apnea)  9. Screening for colorectal cancer  - POC Hemoccult Bld/Stl  10. Screening for ischemic heart disease  - EKG 12-Lead  11. Former smoker  - EKG 12-Lead  12. FH: hypertension  - EKG 12-Lead - [place], RETROPERITNL ABD,  LTD  13. Aortic atherosclerosis (HCC)  - EKG 12-Lead - [place], RETROPERITNL ABD,  LTD  14. Screening for AAA (aortic abdominal aneurysm)  - [place], RETROPERITNL ABD,  LTD  15. Benign prostatic hyperplasia with urinary frequency  - PSA  16. Prostate cancer screening  - PSA  17. Medication management  - Urinalysis, Routine w reflex microscopic - Microalbumin / creatinine urine ratio - CBC with Differential/Platelet - COMPLETE METABOLIC PANEL WITH GFR - Magnesium - Lipid panel - TSH - Hemoglobin A1c - Insulin, random - VITAMIN D 25 Hydroxyl        Patient was counseled in prudent diet, weight control to achieve/maintain BMI less than 25, BP monitoring, regular exercise and medications as discussed.  Discussed med effects and SE's. Routine screening labs and tests as requested with regular follow-up as recommended. Over 40 minutes of exam, counseling, chart review and high complex critical decision making was performed

## 2017-06-17 NOTE — Patient Instructions (Signed)

## 2017-06-18 ENCOUNTER — Ambulatory Visit (INDEPENDENT_AMBULATORY_CARE_PROVIDER_SITE_OTHER): Payer: PPO | Admitting: Internal Medicine

## 2017-06-18 ENCOUNTER — Encounter: Payer: Self-pay | Admitting: Internal Medicine

## 2017-06-18 VITALS — BP 110/74 | HR 76 | Temp 97.3°F | Resp 16 | Ht 68.5 in | Wt 156.0 lb

## 2017-06-18 DIAGNOSIS — Z8249 Family history of ischemic heart disease and other diseases of the circulatory system: Secondary | ICD-10-CM

## 2017-06-18 DIAGNOSIS — Z79899 Other long term (current) drug therapy: Secondary | ICD-10-CM

## 2017-06-18 DIAGNOSIS — R7309 Other abnormal glucose: Secondary | ICD-10-CM | POA: Diagnosis not present

## 2017-06-18 DIAGNOSIS — R7303 Prediabetes: Secondary | ICD-10-CM | POA: Diagnosis not present

## 2017-06-18 DIAGNOSIS — I10 Essential (primary) hypertension: Secondary | ICD-10-CM | POA: Diagnosis not present

## 2017-06-18 DIAGNOSIS — Z1212 Encounter for screening for malignant neoplasm of rectum: Secondary | ICD-10-CM

## 2017-06-18 DIAGNOSIS — R350 Frequency of micturition: Secondary | ICD-10-CM

## 2017-06-18 DIAGNOSIS — E782 Mixed hyperlipidemia: Secondary | ICD-10-CM | POA: Diagnosis not present

## 2017-06-18 DIAGNOSIS — E559 Vitamin D deficiency, unspecified: Secondary | ICD-10-CM

## 2017-06-18 DIAGNOSIS — Z0000 Encounter for general adult medical examination without abnormal findings: Secondary | ICD-10-CM | POA: Diagnosis not present

## 2017-06-18 DIAGNOSIS — F0280 Dementia in other diseases classified elsewhere without behavioral disturbance: Secondary | ICD-10-CM

## 2017-06-18 DIAGNOSIS — G4733 Obstructive sleep apnea (adult) (pediatric): Secondary | ICD-10-CM

## 2017-06-18 DIAGNOSIS — N401 Enlarged prostate with lower urinary tract symptoms: Secondary | ICD-10-CM

## 2017-06-18 DIAGNOSIS — Z125 Encounter for screening for malignant neoplasm of prostate: Secondary | ICD-10-CM

## 2017-06-18 DIAGNOSIS — Z136 Encounter for screening for cardiovascular disorders: Secondary | ICD-10-CM

## 2017-06-18 DIAGNOSIS — Z1211 Encounter for screening for malignant neoplasm of colon: Secondary | ICD-10-CM

## 2017-06-18 DIAGNOSIS — G301 Alzheimer's disease with late onset: Secondary | ICD-10-CM

## 2017-06-18 DIAGNOSIS — I700 Atherosclerosis of aorta: Secondary | ICD-10-CM

## 2017-06-18 DIAGNOSIS — Z0001 Encounter for general adult medical examination with abnormal findings: Secondary | ICD-10-CM

## 2017-06-18 DIAGNOSIS — Z87891 Personal history of nicotine dependence: Secondary | ICD-10-CM

## 2017-06-18 MED ORDER — CALCIUM 600 MG PO TABS: ORAL_TABLET | ORAL | Status: DC

## 2017-06-18 MED ORDER — VITAMIN D 50 MCG (2000 UT) PO CAPS: ORAL_CAPSULE | ORAL | Status: DC

## 2017-06-18 MED ORDER — MAGNESIUM 250 MG PO TABS: ORAL_TABLET | ORAL | 0 refills | Status: DC

## 2017-06-19 LAB — COMPLETE METABOLIC PANEL WITH GFR
AG RATIO: 1.8 (calc) (ref 1.0–2.5)
ALBUMIN MSPROF: 4.1 g/dL (ref 3.6–5.1)
ALT: 10 U/L (ref 9–46)
AST: 17 U/L (ref 10–35)
Alkaline phosphatase (APISO): 52 U/L (ref 40–115)
BUN: 15 mg/dL (ref 7–25)
CO2: 32 mmol/L (ref 20–32)
CREATININE: 0.88 mg/dL (ref 0.70–1.18)
Calcium: 9.4 mg/dL (ref 8.6–10.3)
Chloride: 99 mmol/L (ref 98–110)
GFR, EST AFRICAN AMERICAN: 97 mL/min/{1.73_m2} (ref >=60)
GFR, EST NON AFRICAN AMERICAN: 83 mL/min/{1.73_m2} (ref >=60)
Globulin: 2.3 g/dL (calc) (ref 1.9–3.7)
Glucose, Bld: 91 mg/dL (ref 65–99)
Potassium: 4.4 mmol/L (ref 3.5–5.3)
SODIUM: 136 mmol/L (ref 135–146)
Total Bilirubin: 0.7 mg/dL (ref 0.2–1.2)
Total Protein: 6.4 g/dL (ref 6.1–8.1)

## 2017-06-19 LAB — CBC WITH DIFFERENTIAL/PLATELET
Basophils Absolute: 79 cells/uL (ref 0–200)
Basophils Relative: 1.3 %
EOS PCT: 5.1 %
Eosinophils Absolute: 311 cells/uL (ref 15–500)
HCT: 42 % (ref 38.5–50.0)
Hemoglobin: 14.5 g/dL (ref 13.2–17.1)
LYMPHS ABS: 2324 {cells}/uL (ref 850–3900)
MCH: 32.2 pg (ref 27.0–33.0)
MCHC: 34.5 g/dL (ref 32.0–36.0)
MCV: 93.3 fL (ref 80.0–100.0)
MPV: 10.9 fL (ref 7.5–12.5)
Monocytes Relative: 8.4 %
NEUTROS ABS: 2873 {cells}/uL (ref 1500–7800)
NEUTROS PCT: 47.1 %
PLATELETS: 239 10*3/uL (ref 140–400)
RBC: 4.5 10*6/uL (ref 4.20–5.80)
RDW: 11.5 % (ref 11.0–15.0)
Total Lymphocyte: 38.1 %
WBC mixed population: 512 cells/uL (ref 200–950)
WBC: 6.1 10*3/uL (ref 3.8–10.8)

## 2017-06-19 LAB — INSULIN, RANDOM: Insulin: 9 u[IU]/mL (ref 2.0–19.6)

## 2017-06-19 LAB — URINALYSIS, ROUTINE W REFLEX MICROSCOPIC
Bilirubin Urine: NEGATIVE
Glucose, UA: NEGATIVE
HGB URINE DIPSTICK: NEGATIVE
KETONES UR: NEGATIVE
Leukocytes, UA: NEGATIVE
NITRITE: NEGATIVE
PROTEIN: NEGATIVE
SPECIFIC GRAVITY, URINE: 1.013 (ref 1.001–1.03)
pH: >=8.5 — AB (ref 5.0–8.0)

## 2017-06-19 LAB — MAGNESIUM: Magnesium: 1.9 mg/dL (ref 1.5–2.5)

## 2017-06-19 LAB — HEMOGLOBIN A1C
Hgb A1c MFr Bld: 5.2 % of total Hgb (ref <5.7)
MEAN PLASMA GLUCOSE: 103 (calc)
eAG (mmol/L): 5.7 (calc)

## 2017-06-19 LAB — LIPID PANEL
CHOL/HDL RATIO: 4 (calc) (ref <5.0)
CHOLESTEROL: 196 mg/dL (ref <200)
HDL: 49 mg/dL (ref >40)
LDL CHOLESTEROL (CALC): 121 mg/dL — AB
NON-HDL CHOLESTEROL (CALC): 147 mg/dL — AB (ref <130)
Triglycerides: 148 mg/dL (ref <150)

## 2017-06-19 LAB — MICROALBUMIN / CREATININE URINE RATIO: CREATININE, URINE: 58 mg/dL (ref 20–320)

## 2017-06-19 LAB — VITAMIN D 25 HYDROXY (VIT D DEFICIENCY, FRACTURES): Vit D, 25-Hydroxy: 85 ng/mL (ref 30–100)

## 2017-06-19 LAB — PSA: PSA: 2.1 ng/mL (ref <=4.0)

## 2017-06-19 LAB — TSH: TSH: 0.57 mIU/L (ref 0.40–4.50)

## 2017-08-02 ENCOUNTER — Other Ambulatory Visit: Payer: Self-pay | Admitting: Internal Medicine

## 2017-08-02 DIAGNOSIS — E782 Mixed hyperlipidemia: Secondary | ICD-10-CM

## 2017-08-02 MED ORDER — EZETIMIBE 10 MG PO TABS: ORAL_TABLET | ORAL | 1 refills | Status: DC

## 2017-09-24 DIAGNOSIS — H401131 Primary open-angle glaucoma, bilateral, mild stage: Secondary | ICD-10-CM | POA: Diagnosis not present

## 2017-10-07 NOTE — Progress Notes (Signed)
FOLLOW UP  Assessment and Plan:   Hypertension Well controlled with current medications  Monitor blood pressure at home; patient to call if consistently greater than 130/80 Continue DASH diet.   Reminder to go to the ER if any CP, SOB, nausea, dizziness, severe HA, changes vision/speech, left arm numbness and tingling and jaw pain.  Cholesterol Currently above goal; discussed LDL goal <100; continue medications and lifestyle Continue low cholesterol diet and exercise.  Check lipid panel.   Other abnormal glucose Recent A1Cs at goal Discussed diet/exercise, weight management  Defer A1C; check CMP  BMI 23 Continue to recommend diet heavy in fruits and veggies and low in animal meats, cheeses, and dairy products, appropriate calorie intake Discuss exercise recommendations routinely Continue to monitor weight at each visit  Vitamin D Def At goal at last visit; continue supplementation to maintain goal of 70-100 Defer Vit D level  SDAT (senile dementia of Alzheimer's type) Continue to monitor -  Balance is declining, discussed PT, declined No longer treated by namenda/aricept   Right groin rash Keep clean and dry Apply nystatin cream BID until resolves, 10-14 days Follow up if not improving  Continue diet and meds as discussed. Further disposition pending results of labs. Discussed med's effects and SE's.   Over 30 minutes of exam, counseling, chart review, and critical decision making was performed.   Future Appointments  Date Time Provider Department Center  01/12/2018 11:30 AM Lucky Cowboy, MD [facility] None  03/25/2018 10:30 AM Judd Gaudier, NP [facility] None  07/22/2018 10:00 AM Lucky Cowboy, MD [facility] None    ----------------------------------------------------------------------------------------------------------------------  HPI 77 y.o. male  presents for 3 month follow up on hypertension, cholesterol, glucose management, weight and vitamin D  deficiency. He has SDAT previously treated by namenda/aricept but this was discontinued for lack of perceived benefit and ?SE, now monitoring only. Patient has a very short term recall, but does remain independent in personal ADLs with guidance by his wife. Head CT scan in 2013 showed atrophy. He is overdue for hemoccult card and has not completed - reminded to do so.   BMI is Body mass index is 23.22 kg/m., he has been working on diet and exercise, some mild walking, chair exercises.  Wt Readings from Last 3 Encounters:  10/08/17 155 lb (70.3 kg)  06/18/17 156 lb (70.8 kg)  03/20/17 154 lb (69.9 kg)   His blood pressure has been controlled at home, today their BP is BP: 102/70  He does not workout. He denies chest pain, shortness of breath, dizziness.   He is on cholesterol medication (lopid, zetia) and denies myalgias. His cholesterol is not at goal. The cholesterol last visit was:   Lab Results  Component Value Date   CHOL 196 06/18/2017   HDL 49 06/18/2017   LDLCALC 121 (H) 06/18/2017   TRIG 148 06/18/2017   CHOLHDL 4.0 06/18/2017    He has been working on diet and exercise for glucose management, and denies foot ulcerations, increased appetite, nausea, paresthesia of the feet, polydipsia, polyuria, visual disturbances, vomiting and weight loss. Last A1C in the office was:  Lab Results  Component Value Date   HGBA1C 5.2 06/18/2017   Patient is on Vitamin D supplement.   Lab Results  Component Value Date   VD25OH 82 06/18/2017        Current Medications:  Current Outpatient Medications on File Prior to Visit  Medication Sig  . Albuterol Sulfate (PROAIR RESPICLICK) 108 (90 Base) MCG/ACT AEPB Inhale 1 Inhaler into  the lungs 3 (three) times daily as needed.  . calcium carbonate (OS-CAL) 600 MG tablet Takes 1 tab / day  . Cholecalciferol (VITAMIN D) 2000 units CAPS Takes 1 cap 2 x / day  . ezetimibe (ZETIA) 10 MG tablet TAKE 1 TABLET BY MOUTH FOR CHOLESTEROL.(PLEASE CONTINUE  GEMFIBROZIL)  . gemfibrozil (LOPID) 600 MG tablet Take 1 tablet (600 mg total) by mouth 2 (two) times daily before a meal.  . latanoprost (XALATAN) 0.005 % ophthalmic solution Place 1 drop into both eyes at bedtime.   Marland Kitchen loratadine (CLARITIN) 10 MG tablet Take 10 mg by mouth daily.  . Magnesium 250 MG TABS Takes 1 tab / day  . MAGNESIUM CITRATE PO Take by mouth.  . Omega-3 Fatty Acids (FISH OIL) 1000 MG CAPS Take 1 capsule (1,000 mg total) by mouth 2 (two) times daily.  . B Complex-C (SUPER B COMPLEX PO) Take by mouth daily.   No current facility-administered medications on file prior to visit.      Allergies:  Allergies  Allergen Reactions  . Benadryl [Diphenhydramine Hcl] Other (See Comments)    Hallucinations, itching, shaking     Medical History:  Past Medical History:  Diagnosis Date  . Glaucoma   . OSA (obstructive sleep apnea)   . RLS (restless legs syndrome)   . Vitamin D deficiency    Family history- Reviewed and unchanged Social history- Reviewed and unchanged   Review of Systems:  Review of Systems  Constitutional: Negative for malaise/fatigue and weight loss.  HENT: Negative for hearing loss and tinnitus.   Eyes: Negative for blurred vision and double vision.  Respiratory: Negative for cough, shortness of breath and wheezing.   Cardiovascular: Negative for chest pain, palpitations, orthopnea, claudication and leg swelling.  Gastrointestinal: Negative for abdominal pain, blood in stool, constipation, diarrhea, heartburn, melena, nausea and vomiting.  Genitourinary: Negative.   Musculoskeletal: Negative for joint pain and myalgias.  Skin: Positive for rash.  Neurological: Negative for dizziness, tingling, sensory change, weakness and headaches.  Endo/Heme/Allergies: Negative for polydipsia.  Psychiatric/Behavioral: Negative.   All other systems reviewed and are negative.     Physical Exam: BP 102/70   Pulse 70   Temp (!) 97.3 F (36.3 C)   Ht 5' 8.5"  (1.74 m)   Wt 155 lb (70.3 kg)   SpO2 97%   BMI 23.22 kg/m  Wt Readings from Last 3 Encounters:  10/08/17 155 lb (70.3 kg)  06/18/17 156 lb (70.8 kg)  03/20/17 154 lb (69.9 kg)   General Appearance: Well nourished, in no apparent distress. Eyes: PERRLA, EOMs, conjunctiva no swelling or erythema Sinuses: No Frontal/maxillary tenderness ENT/Mouth: Ext aud canals clear, TMs without erythema, bulging. No erythema, swelling, or exudate on post pharynx.  Tonsils not swollen or erythematous. Hearing normal.  Neck: Supple, thyroid normal.  Respiratory: Respiratory effort normal, BS equal bilaterally without rales, rhonchi, wheezing or stridor.  Cardio: RRR with no MRGs. Brisk peripheral pulses without edema.  Abdomen: Soft, + BS.  Non tender, no guarding, rebound, hernias, masses. Lymphatics: Non tender without lymphadenopathy.  Musculoskeletal: Full ROM, 5/5 strength, slow gait Skin: Warm, dry without lesions, ecchymosis; erythematous rash to right groin without distinct lesions or discharge Neuro: Cranial nerves intact. No cerebellar symptoms.  Psych: Awake and oriented X 3, normal affect, Insight and Judgment appropriate, very poor short term recall   Dan Maker, NP 11:11 AM [facility]

## 2017-10-08 ENCOUNTER — Ambulatory Visit (INDEPENDENT_AMBULATORY_CARE_PROVIDER_SITE_OTHER): Payer: PPO | Admitting: Adult Health

## 2017-10-08 ENCOUNTER — Encounter: Payer: Self-pay | Admitting: Adult Health

## 2017-10-08 VITALS — BP 102/70 | HR 70 | Temp 97.3°F | Ht 68.5 in | Wt 155.0 lb

## 2017-10-08 DIAGNOSIS — G301 Alzheimer's disease with late onset: Secondary | ICD-10-CM | POA: Diagnosis not present

## 2017-10-08 DIAGNOSIS — Z6823 Body mass index (BMI) 23.0-23.9, adult: Secondary | ICD-10-CM

## 2017-10-08 DIAGNOSIS — I10 Essential (primary) hypertension: Secondary | ICD-10-CM

## 2017-10-08 DIAGNOSIS — E559 Vitamin D deficiency, unspecified: Secondary | ICD-10-CM | POA: Diagnosis not present

## 2017-10-08 DIAGNOSIS — Z79899 Other long term (current) drug therapy: Secondary | ICD-10-CM

## 2017-10-08 DIAGNOSIS — F0280 Dementia in other diseases classified elsewhere without behavioral disturbance: Secondary | ICD-10-CM | POA: Diagnosis not present

## 2017-10-08 DIAGNOSIS — R7309 Other abnormal glucose: Secondary | ICD-10-CM | POA: Diagnosis not present

## 2017-10-08 DIAGNOSIS — E782 Mixed hyperlipidemia: Secondary | ICD-10-CM

## 2017-10-08 DIAGNOSIS — B3789 Other sites of candidiasis: Secondary | ICD-10-CM | POA: Diagnosis not present

## 2017-10-08 LAB — CBC WITH DIFFERENTIAL/PLATELET
BASOS PCT: 1.2 %
Basophils Absolute: 94 cells/uL (ref 0–200)
EOS ABS: 242 {cells}/uL (ref 15–500)
Eosinophils Relative: 3.1 %
HCT: 44.1 % (ref 38.5–50.0)
HEMOGLOBIN: 15 g/dL (ref 13.2–17.1)
Lymphs Abs: 3276 cells/uL (ref 850–3900)
MCH: 32.2 pg (ref 27.0–33.0)
MCHC: 34 g/dL (ref 32.0–36.0)
MCV: 94.6 fL (ref 80.0–100.0)
MPV: 11 fL (ref 7.5–12.5)
Monocytes Relative: 7.3 %
NEUTROS ABS: 3619 {cells}/uL (ref 1500–7800)
Neutrophils Relative %: 46.4 %
Platelets: 237 10*3/uL (ref 140–400)
RBC: 4.66 10*6/uL (ref 4.20–5.80)
RDW: 11.8 % (ref 11.0–15.0)
Total Lymphocyte: 42 %
WBC: 7.8 10*3/uL (ref 3.8–10.8)
WBCMIX: 569 {cells}/uL (ref 200–950)

## 2017-10-08 LAB — COMPLETE METABOLIC PANEL WITH GFR
AG RATIO: 2.1 (calc) (ref 1.0–2.5)
ALT: 12 U/L (ref 9–46)
AST: 19 U/L (ref 10–35)
Albumin: 4.6 g/dL (ref 3.6–5.1)
Alkaline phosphatase (APISO): 60 U/L (ref 40–115)
BUN: 15 mg/dL (ref 7–25)
CO2: 31 mmol/L (ref 20–32)
CREATININE: 0.93 mg/dL (ref 0.70–1.18)
Calcium: 10.1 mg/dL (ref 8.6–10.3)
Chloride: 98 mmol/L (ref 98–110)
GFR, EST NON AFRICAN AMERICAN: 79 mL/min/{1.73_m2} (ref >=60)
GFR, Est African American: 92 mL/min/{1.73_m2} (ref >=60)
GLUCOSE: 74 mg/dL (ref 65–99)
Globulin: 2.2 g/dL (calc) (ref 1.9–3.7)
Potassium: 4.2 mmol/L (ref 3.5–5.3)
Sodium: 136 mmol/L (ref 135–146)
TOTAL PROTEIN: 6.8 g/dL (ref 6.1–8.1)
Total Bilirubin: 0.7 mg/dL (ref 0.2–1.2)

## 2017-10-08 LAB — LIPID PANEL
CHOL/HDL RATIO: 3.5 (calc) (ref <5.0)
CHOLESTEROL: 182 mg/dL (ref <200)
HDL: 52 mg/dL (ref >40)
LDL CHOLESTEROL (CALC): 113 mg/dL — AB
NON-HDL CHOLESTEROL (CALC): 130 mg/dL — AB (ref <130)
TRIGLYCERIDES: 80 mg/dL (ref <150)

## 2017-10-08 LAB — TSH: TSH: 0.77 mIU/L (ref 0.40–4.50)

## 2017-10-08 MED ORDER — NYSTATIN 100000 UNIT/GM EX CREA: 1.0000 "application " | TOPICAL_CREAM | Freq: Two times a day (BID) | CUTANEOUS | 1 refills | Status: DC

## 2017-10-08 NOTE — Patient Instructions (Addendum)
Aim for 7+ servings of fruits and vegetables daily  65-80+ fluid ounces of water or unsweet tea for healthy kidneys  Limit to max 1 drink of alcohol per day; avoid smoking/tobacco  Limit animal fats in diet for cholesterol and heart health - choose grass fed whenever available  Avoid highly processed foods, and foods high in saturated/trans fats  Aim for low stress - take time to unwind and care for your mental health  Aim for 150 min of moderate intensity exercise weekly for heart health, and weights twice weekly for bone health  Aim for 7-9 hours of sleep daily    Nystatin skin cream or ointment What is this medicine? NYSTATIN (nye STAT in) is an antifungal medicine. It is used to treat certain kinds of fungal or yeast infections of the skin. This medicine may be used for other purposes; ask your health care provider or pharmacist if you have questions. COMMON BRAND NAME(S): Mycostatin, Nystex, Pediaderm AF What should I tell my health care provider before I take this medicine? They need to know if you have any of these conditions: -an unusual or allergic reaction to nystatin, other foods, dyes or preservatives -pregnant or trying to get pregnant -breast-feeding How should I use this medicine? This medicine is for external use on the skin only. Follow the directions on the prescription label. Wash hands before and after use. If treating a hand or nail infection, wash hands before use only. Apply a thin layer of this medicine to cover the affected skin and surrounding area. You can cover the area with a sterile gauze dressing (bandage). Do not use an airtight bandage (such as a plastic-covered bandage). Do not get the medicine in your eyes. If you do, rinse out with plenty of cool tap water. Use the full course of treatment prescribed, even if you think the infection is getting better. Use at regular intervals. Do not use your medicine more often than directed. Do not use this  medicine for any condition other than the one for which it was prescribed. Talk to your pediatrician regarding the use of this medicine in children. Special care may be needed. Overdosage: If you think you have taken too much of this medicine contact a poison control center or emergency room at once. NOTE: This medicine is only for you. Do not share this medicine with others. What if I miss a dose? If you miss a dose, use it as soon as you can. If it is almost time for your next dose, use only that dose. Do not use double or extra doses. What may interact with this medicine? Interactions are not expected. Do not use any other skin products on the affected area without telling your doctor or health care professional. This list may not describe all possible interactions. Give your health care provider a list of all the medicines, herbs, non-prescription drugs, or dietary supplements you use. Also tell them if you smoke, drink alcohol, or use illegal drugs. Some items may interact with your medicine. What should I watch for while using this medicine? Tell your doctor or health care professional if your symptoms do not improve after 3 days. After bathing make sure that your skin is very dry. Fungal infections like moist conditions. Do not walk around barefoot. To help prevent reinfection, wear freshly washed cotton, not synthetic, clothing. What side effects may I notice from receiving this medicine? Side effects that usually do not require medical attention (report to your doctor or  health care professional if they continue or are bothersome): -skin irritation This list may not describe all possible side effects. Call your doctor for medical advice about side effects. You may report side effects to FDA at 1-800-FDA-1088. Where should I keep my medicine? Keep out of the reach of children. Store at room temperature 15 to 30 degrees C (59 to 86 degrees F). Throw away any unused medicine after the  expiration date. NOTE: This sheet is a summary. It may not cover all possible information. If you have questions about this medicine, talk to your doctor, pharmacist, or health care provider.  2018 Elsevier/Gold Standard (2015-02-22 16:28:30)

## 2017-10-29 ENCOUNTER — Ambulatory Visit (INDEPENDENT_AMBULATORY_CARE_PROVIDER_SITE_OTHER): Payer: PPO

## 2017-10-29 DIAGNOSIS — Z23 Encounter for immunization: Secondary | ICD-10-CM | POA: Diagnosis not present

## 2017-12-09 ENCOUNTER — Other Ambulatory Visit: Payer: Self-pay | Admitting: Adult Health

## 2017-12-09 ENCOUNTER — Other Ambulatory Visit: Payer: Self-pay

## 2017-12-09 DIAGNOSIS — B3789 Other sites of candidiasis: Secondary | ICD-10-CM

## 2017-12-09 MED ORDER — KETOCONAZOLE 2 % EX CREA: 1.0000 "application " | TOPICAL_CREAM | Freq: Two times a day (BID) | CUTANEOUS | 0 refills | Status: DC

## 2017-12-09 MED ORDER — NYSTATIN 100000 UNIT/GM EX CREA: 1.0000 "application " | TOPICAL_CREAM | Freq: Two times a day (BID) | CUTANEOUS | 1 refills | Status: DC

## 2017-12-09 MED ORDER — CICLOPIROX 8 % EX KIT: PACK | CUTANEOUS | 3 refills | Status: DC

## 2017-12-09 MED ORDER — TERBINAFINE HCL 250 MG PO TABS: 250.0000 mg | ORAL_TABLET | Freq: Every day | ORAL | 0 refills | Status: AC

## 2017-12-09 NOTE — Telephone Encounter (Signed)
Refill request for Nystatin cream, however, it's not fully fixing the fungal infection in his groin area. Would like to know if there is something that he can add to this.

## 2017-12-14 ENCOUNTER — Other Ambulatory Visit: Payer: Self-pay | Admitting: Physician Assistant

## 2017-12-28 ENCOUNTER — Other Ambulatory Visit: Payer: Self-pay | Admitting: Adult Health

## 2018-01-07 DIAGNOSIS — H401131 Primary open-angle glaucoma, bilateral, mild stage: Secondary | ICD-10-CM | POA: Diagnosis not present

## 2018-01-07 DIAGNOSIS — H2513 Age-related nuclear cataract, bilateral: Secondary | ICD-10-CM | POA: Diagnosis not present

## 2018-01-11 NOTE — Patient Instructions (Signed)
Dementia Caregiver Guide Dementia is a term used to describe a number of symptoms that affect memory and thinking. The most common symptoms include:  Memory loss.  Trouble with language and communication.  Trouble concentrating.  Poor judgment.  Problems with reasoning.  Child-like behavior and language.  Extreme anxiety.  Angry outbursts.  Wandering from home or public places.  Dementia usually gets worse slowly over time. In the early stages, people with dementia can stay independent and safe with some help. In later stages, they need help with daily tasks such as dressing, grooming, and using the bathroom. How to help the person with dementia cope Dementia can be frightening and confusing. Here are some tips to help the person with dementia cope with changes caused by the disease. General tips  Keep the person on track with his or her routine.  Try to identify areas where the person may need help.  Be supportive, patient, calm, and encouraging.  Gently remind the person that adjusting to changes takes time.  Help with the tasks that the person has asked for help with.  Keep the person involved in daily tasks and decisions as much as possible.  Encourage conversation, but try not to get frustrated or harried if the person struggles to find words or does not seem to appreciate your help. Communication tips  When the person is talking or seems frustrated, make eye contact and hold the person's hand.  Ask specific questions that need yes or no answers.  Use simple words, short sentences, and a calm voice. Only give one direction at a time.  When offering choices, limit them to just 1 or 2.  Avoid correcting the person in a negative way.  If the person is struggling to find the right words, gently try to help him or her. How to recognize symptoms of stress Symptoms of stress in caregivers include:  Feeling frustrated or angry with the person with  dementia.  Denying that the person has dementia or that his or her symptoms will not improve.  Feeling hopeless and unappreciated.  Difficulty sleeping.  Difficulty concentrating.  Feeling anxious, irritable, or depressed.  Developing stress-related health problems.  Feeling like you have too little time for your own life.  Follow these instructions at home:  Make sure that you and the person you are caring for: ? Get regular sleep. ? Exercise regularly. ? Eat regular, nutritious meals. ? Drink enough fluid to keep your urine clear or pale yellow. ? Take over-the-counter and prescription medicines only as told by your health care providers. ? Attend all scheduled health care appointments.  Join a support group with others who are caregivers.  Ask about respite care resources so that you can have a regular break from the stress of caregiving.  Look for signs of stress in yourself and in the person you are caring for. If you notice signs of stress, take steps to manage it.  Consider any safety risks and take steps to avoid them.  Organize medications in a pill box for each day of the week.  Create a plan to handle any legal or financial matters. Get legal or financial advice if needed.  Keep a calendar in a central location to remind the person of appointments or other activities. Tips for reducing the risk of injury  Keep floors clear of clutter. Remove rugs, magazine racks, and floor lamps.  Keep hallways well lit, especially at night.  Put a handrail and nonslip mat in the bathtub   bathtub or shower.  Put childproof locks on cabinets that contain dangerous items, such as medicines, alcohol, guns, toxic cleaning items, sharp tools or utensils, matches, and lighters.  Put the locks in places where the person cannot see or reach them easily. This will help ensure that the person does not wander out of the house and get lost.  Be prepared for emergencies. Keep a list of  emergency phone numbers and addresses in a convenient area.  Remove car keys and lock garage doors so that the person does not try to get in the car and drive.  Have the person wear a bracelet that tracks locations and identifies the person as having memory problems. This should be worn at all times for safety. Where to find support: Many individuals and organizations offer support. These include:  Support groups for people with dementia and for caregivers.  Counselors or therapists.  Home health care services.  Adult day care centers.  Where to find more information: Alzheimer's Association: [employer] Contact a health care provider if:  The person's health is rapidly getting worse.  You are no longer able to care for the person.  Caring for the person is affecting your physical and emotional health.  The person threatens himself or herself, you, or anyone else. Summary  Dementia is a term used to describe a number of symptoms that affect memory and thinking.  Dementia usually gets worse slowly over time.  Take steps to reduce the person's risk of injury, and to plan for future care.  Caregivers need support, relief from caregiving, and time for their own lives. This information is not intended to replace advice given to you by your health care provider. Make sure you discuss any questions you have with your health care provider. Document Released: 12/26/2015 Document Revised: 12/26/2015 Document Reviewed: 12/26/2015 Elsevier Interactive Patient Education  2018 Elsevier Inc.  ++++++++++++++++++++++++++ Recommend Adult Low Dose Aspirin or  coated  Aspirin 81 mg daily  To reduce risk of Colon Cancer 20 %,  Skin Cancer 26 % ,  Melanoma 46%  and  Pancreatic cancer 60% +++++++++++++++++++++++++ Vitamin D goal  is between 70-100.  Please make sure that you are taking your Vitamin D as directed.  It is very important as a natural anti-inflammatory  helping hair, skin,  and nails, as well as reducing stroke and heart attack risk.  It helps your bones and helps with mood. It also decreases numerous cancer risks so please take it as directed.  Low Vit D is associated with a 200-300% higher risk for CANCER  and 200-300% higher risk for HEART   ATTACK  &  STROKE.   .....................................Marland Kitchen It is also associated with higher death rate at younger ages,  autoimmune diseases like Rheumatoid arthritis, Lupus, Multiple Sclerosis.    Also many other serious conditions, like depression, Alzheimer's Dementia, infertility, muscle aches, fatigue, fibromyalgia - just to name a few. ++++++++++++++++++++ Recommend the book "The END of DIETING" by Dr Monico Hoar  & the book "The END of DIABETES " by Dr Monico Hoar At [email or web address] - get book & Audio CD's    Being diabetic has a  300% increased risk for heart attack, stroke, cancer, and alzheimer- type vascular dementia. It is very important that you work harder with diet by avoiding all foods that are white. Avoid white rice (brown & wild rice is OK), white potatoes (sweetpotatoes in moderation is OK), White bread or wheat bread or anything made out of white flour  like bagels, donuts, rolls, buns, biscuits, cakes, pastries, cookies, pizza crust, and pasta (made from white flour & egg whites) - vegetarian pasta or spinach or wheat pasta is OK. Multigrain breads like Arnold's or Pepperidge Farm, or multigrain sandwich thins or flatbreads.  Diet, exercise and weight loss can reverse and cure diabetes in the early stages.  Diet, exercise and weight loss is very important in the control and prevention of complications of diabetes which affects every system in your body, ie. Brain - dementia/stroke, eyes - glaucoma/blindness, heart - heart attack/heart failure, kidneys - dialysis, stomach - gastric paralysis, intestines - malabsorption, nerves - severe painful neuritis, circulation - gangrene & loss of a leg(s), and finally  cancer and Alzheimers.    I recommend avoid fried & greasy foods,  sweets/candy, white rice (brown or wild rice or Quinoa is OK), white potatoes (sweet potatoes are OK) - anything made from white flour - bagels, doughnuts, rolls, buns, biscuits,white and wheat breads, pizza crust and traditional pasta made of white flour & egg white(vegetarian pasta or spinach or wheat pasta is OK).  Multi-grain bread is OK - like multi-grain flat bread or sandwich thins. Avoid alcohol in excess. Exercise is also important.    Eat all the vegetables you want - avoid meat, especially red meat and dairy - especially cheese.  Cheese is the most concentrated form of trans-fats which is the worst thing to clog up our arteries. Veggie cheese is OK which can be found in the fresh produce section at [employer] or [employer] or [employer]  +++++++++++++++++++++ DASH Eating Plan  DASH stands for "Dietary Approaches to Stop Hypertension."   The DASH eating plan is a healthy eating plan that has been shown to reduce high blood pressure (hypertension). Additional health benefits may include reducing the risk of type 2 diabetes mellitus, heart disease, and stroke. The DASH eating plan may also help with weight loss. WHAT DO I NEED TO KNOW ABOUT THE DASH EATING PLAN? For the DASH eating plan, you will follow these general guidelines:  Choose foods with a percent daily value for sodium of less than 5% (as listed on the food label).  Use salt-free seasonings or herbs instead of table salt or sea salt.  Check with your health care provider or pharmacist before using salt substitutes.  Eat lower-sodium products, often labeled as "lower sodium" or "no salt added."  Eat fresh foods.  Eat more vegetables, fruits, and low-fat dairy products.  Choose whole grains. Look for the word "whole" as the first word in the ingredient list.  Choose fish   Limit sweets, desserts, sugars, and sugary drinks.  Choose heart-healthy  fats.  Eat veggie cheese   Eat more home-cooked food and less restaurant, buffet, and fast food.  Limit fried foods.  Cook foods using methods other than frying.  Limit canned vegetables. If you do use them, rinse them well to decrease the sodium.  When eating at a restaurant, ask that your food be prepared with less salt, or no salt if possible.                      WHAT FOODS CAN I EAT? Read Dr Francis Dowse Fuhrman's books on The End of Dieting & The End of Diabetes  Grains Whole grain or whole wheat bread. Brown rice. Whole grain or whole wheat pasta. Quinoa, bulgur, and whole grain cereals. Low-sodium cereals. Corn or whole wheat flour tortillas. Whole grain cornbread. Whole grain crackers. Low-sodium  crackers.  Vegetables Fresh or frozen vegetables (raw, steamed, roasted, or grilled). Low-sodium or reduced-sodium tomato and vegetable juices. Low-sodium or reduced-sodium tomato sauce and paste. Low-sodium or reduced-sodium canned vegetables.   Fruits All fresh, canned (in natural juice), or frozen fruits.  Protein Products  All fish and seafood.  Dried beans, peas, or lentils. Unsalted nuts and seeds. Unsalted canned beans.  Dairy Low-fat dairy products, such as skim or 1% milk, 2% or reduced-fat cheeses, low-fat ricotta or cottage cheese, or plain low-fat yogurt. Low-sodium or reduced-sodium cheeses.  Fats and Oils Tub margarines without trans fats. Light or reduced-fat mayonnaise and salad dressings (reduced sodium). Avocado. Safflower, olive, or canola oils. Natural peanut or almond butter.  Other Unsalted popcorn and pretzels. The items listed above may not be a complete list of recommended foods or beverages. Contact your dietitian for more options.  +++++++++++++++  WHAT FOODS ARE NOT RECOMMENDED? Grains/ White flour or wheat flour White bread. White pasta. White rice. Refined cornbread. Bagels and croissants. Crackers that contain trans fat.  Vegetables  Creamed or  fried vegetables. Vegetables in a . Regular canned vegetables. Regular canned tomato sauce and paste. Regular tomato and vegetable juices.  Fruits Dried fruits. Canned fruit in light or heavy syrup. Fruit juice.  Meat and Other Protein Products Meat in general - RED meat & White meat.  Fatty cuts of meat. Ribs, chicken wings, all processed meats as bacon, sausage, bologna, salami, fatback, hot dogs, bratwurst and packaged luncheon meats.  Dairy Whole or 2% milk, cream, half-and-half, and cream cheese. Whole-fat or sweetened yogurt. Full-fat cheeses or blue cheese. Non-dairy creamers and whipped toppings. Processed cheese, cheese spreads, or cheese curds.  Condiments Onion and garlic salt, seasoned salt, table salt, and sea salt. Canned and packaged gravies. Worcestershire sauce. Tartar sauce. Barbecue sauce. Teriyaki sauce. Soy sauce, including reduced sodium. Steak sauce. Fish sauce. Oyster sauce. Cocktail sauce. Horseradish. Ketchup and mustard. Meat flavorings and tenderizers. Bouillon cubes. Hot sauce. Tabasco sauce. Marinades. Taco seasonings. Relishes.  Fats and Oils Butter, stick margarine, lard, shortening and bacon fat. Coconut, palm kernel, or palm oils. Regular salad dressings.  Pickles and olives. Salted popcorn and pretzels.  The items listed above may not be a complete list of foods and beverages to avoid.

## 2018-01-11 NOTE — Progress Notes (Signed)
This very nice 77 y.o. MWM presents for 3 month follow up with HTN, HLD, Pre-Diabetes and Vitamin D Deficiency. Patient has OSA and was / is intolerant to the mask. Also he has vascular Dementia and requires close supervision and direction by his caretaker wife. Wife d/c'd his Namendia & Aricept for concern of perceived side-effects. Patient has recently been on Lamisil tabs & Nizoral cream for a balanitis infection w/o improvement.      Patient is treated for HTN (1999) & BP has been controlled at home. Today's BP is at goal -  120/74. Patient has had no complaints of any cardiac type chest pain, palpitations, dyspnea / orthopnea / PND, dizziness, claudication, or dependent edema.     Hyperlipidemia is not controlled with diet & his wife d/c'd his statin & Gemfibrozil for concern of possible side -effects on his memory & cognition. Patient denies myalgias or other med SE's. Last Lipids were not at goal: Lab Results  Component Value Date   CHOL 182 10/08/2017   HDL 52 10/08/2017   LDLCALC 113 (H) 10/08/2017   TRIG 80 10/08/2017   CHOLHDL 3.5 10/08/2017      Also, the patient has history of PreDiabetes  (A1c 5.8% / 2011) and has had no symptoms of reactive hypoglycemia, diabetic polys, paresthesias or visual blurring.  Last A1c was Normal & at goal: Lab Results  Component Value Date   HGBA1C 5.2 06/18/2017      Further, the patient also has history of Vitamin D Deficiency ("24" / 2008)  and supplements vitamin D without any suspected side-effects. Last vitamin D was at goal: Lab Results  Component Value Date   VD25OH 85 06/18/2017   Current Outpatient Medications on File Prior to Visit  Medication Sig  . calcium carbonate (OS-CAL) 600 MG tablet Takes 1 tab / day  . Cholecalciferol (VITAMIN D) 2000 units CAPS Takes 1 cap 2 x / day  . ezetimibe (ZETIA) 10 MG tablet TAKE 1 TABLET BY MOUTH FOR CHOLESTEROL.(PLEASE CONTINUE GEMFIBROZIL)  . gemfibrozil (LOPID) 600 MG tablet TAKE 1 TABLET  BY MOUTH TWICE A DAY BEFORE A MEAL  . ketoconazole (NIZORAL) 2 % cream APPLY EXTERNALLY TO THE AFFECTED AREA TWICE DAILY  . latanoprost (XALATAN) 0.005 % ophthalmic solution Place 1 drop into both eyes at bedtime.   Marland Kitchen. loratadine (CLARITIN) 10 MG tablet Take 10 mg by mouth daily.  . Magnesium 250 MG TABS Takes 1 tab / day  . Omega-3 Fatty Acids (FISH OIL) 1000 MG CAPS Take 1 capsule (1,000 mg total) by mouth 2 (two) times daily.  . TERBINAFINE HCL PO Take 250 mg by mouth daily.   No current facility-administered medications on file prior to visit.    Allergies  Allergen Reactions  . Benadryl [Diphenhydramine Hcl] Other (See Comments)    Hallucinations, itching, shaking   PMHx:   Past Medical History:  Diagnosis Date  . Glaucoma   . OSA (obstructive sleep apnea)   . RLS (restless legs syndrome)   . Vitamin D deficiency    Immunization History  Administered Date(s) Administered  . Influenza Whole 11/23/2012  . Influenza, High Dose Seasonal PF 11/23/2013, 10/19/2014, 10/26/2015, 11/05/2016, 10/29/2017  . Pneumococcal Conjugate-13 01/12/2014  . Pneumococcal Polysaccharide-23 12/02/2008  . Td 09/26/2010  . Zoster 02/25/2008   Past Surgical History:  Procedure Laterality Date  . axillary Right 1986   biopsy of axillary nodes  . PILONIDAL CYST / SINUS EXCISION  1959   FHx:  Reviewed / unchanged  SHx:    Reviewed / unchanged   Systems Review:  Constitutional: Denies fever, chills, wt changes, headaches, insomnia, fatigue, night sweats, change in appetite. Eyes: Denies redness, blurred vision, diplopia, discharge, itchy, watery eyes.  ENT: Denies discharge, congestion, post nasal drip, epistaxis, sore throat, earache, hearing loss, dental pain, tinnitus, vertigo, sinus pain, snoring.  CV: Denies chest pain, palpitations, irregular heartbeat, syncope, dyspnea, diaphoresis, orthopnea, PND, claudication or edema. Respiratory: denies cough, dyspnea, DOE, pleurisy, hoarseness,  laryngitis, wheezing.  Gastrointestinal: Denies dysphagia, odynophagia, heartburn, reflux, water brash, abdominal pain or cramps, nausea, vomiting, bloating, diarrhea, constipation, hematemesis, melena, hematochezia  or hemorrhoids. Genitourinary: Denies dysuria, frequency, urgency, nocturia, hesitancy, discharge, hematuria or flank pain. Musculoskeletal: Denies arthralgias, myalgias, stiffness, jt. swelling, pain, limping or strain/sprain.  Skin: Denies pruritus, rash, hives, warts, acne, eczema or change in skin lesion(s). Neuro: No weakness, tremor, incoordination, spasms, paresthesia or pain. Psychiatric: Denies confusion, memory loss or sensory loss. Endo: Denies change in weight, skin or hair change.  Heme/Lymph: No excessive bleeding, bruising or enlarged lymph nodes.  Physical Exam  BP 120/74   Pulse 72   Temp (!) 97.3 F (36.3 C)   Resp 16   Ht 5' 8.5" (1.74 m)   Wt 155 lb 12.8 oz (70.7 kg)   BMI 23.34 kg/m   Appears  well nourished, well groomed  and in no distress.  Eyes: PERRLA, EOMs, conjunctiva no swelling or erythema. Sinuses: No frontal/maxillary tenderness ENT/Mouth: EAC's clear, TM's nl w/o erythema, bulging. Nares clear w/o erythema, swelling, exudates. Oropharynx clear without erythema or exudates. Oral hygiene is good. Tongue normal, non obstructing. Hearing intact.  Neck: Supple. Thyroid not palpable. Car 2+/2+ without bruits, nodes or JVD. Chest: Respirations nl with BS clear & equal w/o rales, rhonchi, wheezing or stridor.  Cor: Heart sounds normal w/ regular rate and rhythm without sig. murmurs, gallops, clicks or rubs. Peripheral pulses normal and equal  without edema.  Abdomen: Soft & bowel sounds normal. Non-tender w/o guarding, rebound, hernias, masses or organomegaly.  Lymphatics: Unremarkable.  Musculoskeletal: Full ROM all peripheral extremities, joint stability, 5/5 strength and normal gait.  Skin: Warm, dry without exposed rashes, lesions or  ecchymosis apparent.  Neuro: Cranial nerves intact, reflexes equal bilaterally. Sensory-motor testing grossly intact. Tendon reflexes grossly intact.  Pysch: Alert & oriented x 1.  Insight and judgement very concrete & limited.   Assessment and Plan:  1. Essential hypertension  - Continue medication, monitor blood pressure at home.  - Continue DASH diet.  Reminder to go to the ER if any CP,  SOB, nausea, dizziness, severe HA, changes vision/speech.  - CBC with Differential/Platelet - COMPLETE METABOLIC PANEL WITH GFR - Magnesium - TSH  2. Hyperlipidemia, mixed  - Continue diet/meds, exercise,& lifestyle modifications.  - Continue monitor periodic cholesterol/liver & renal functions   - TSH  3. Abnormal glucose  - Continue diet, exercise,  - lifestyle modifications.  - Monitor appropriate labs.  - Hemoglobin A1c - Insulin, random  4. Vitamin D deficiency  - Continue supplementation.   - VITAMIN D 25 Hydroxyl  5. Prediabetes  - Hemoglobin A1c - Insulin, random  6. OSA (obstructive sleep apnea)  7. Vascular dementia without behavioral disturbance (HCC)  8. Medication management  - CBC with Differential/Platelet - COMPLETE METABOLIC PANEL WITH GFR - Magnesium - TSH - Hemoglobin A1c - Insulin, random - VITAMIN D 25 Hydroxyl        Discussed  regular exercise, BP monitoring, weight control  to achieve/maintain BMI less than 25 and discussed med and SE's. Recommended labs to assess and monitor clinical status with further disposition pending results of labs. Over 30 minutes of exam, counseling, chart review was performed.\

## 2018-01-12 ENCOUNTER — Ambulatory Visit (INDEPENDENT_AMBULATORY_CARE_PROVIDER_SITE_OTHER): Payer: PPO | Admitting: Internal Medicine

## 2018-01-12 ENCOUNTER — Encounter: Payer: Self-pay | Admitting: Internal Medicine

## 2018-01-12 VITALS — BP 120/74 | HR 72 | Temp 97.3°F | Resp 16 | Ht 68.5 in | Wt 155.8 lb

## 2018-01-12 DIAGNOSIS — R7303 Prediabetes: Secondary | ICD-10-CM | POA: Diagnosis not present

## 2018-01-12 DIAGNOSIS — I10 Essential (primary) hypertension: Secondary | ICD-10-CM | POA: Diagnosis not present

## 2018-01-12 DIAGNOSIS — R7309 Other abnormal glucose: Secondary | ICD-10-CM | POA: Diagnosis not present

## 2018-01-12 DIAGNOSIS — B3749 Other urogenital candidiasis: Secondary | ICD-10-CM

## 2018-01-12 DIAGNOSIS — F0150 Vascular dementia without behavioral disturbance: Secondary | ICD-10-CM

## 2018-01-12 DIAGNOSIS — Z79899 Other long term (current) drug therapy: Secondary | ICD-10-CM | POA: Diagnosis not present

## 2018-01-12 DIAGNOSIS — G4733 Obstructive sleep apnea (adult) (pediatric): Secondary | ICD-10-CM

## 2018-01-12 DIAGNOSIS — E559 Vitamin D deficiency, unspecified: Secondary | ICD-10-CM

## 2018-01-12 DIAGNOSIS — E782 Mixed hyperlipidemia: Secondary | ICD-10-CM | POA: Diagnosis not present

## 2018-01-12 DIAGNOSIS — Z87891 Personal history of nicotine dependence: Secondary | ICD-10-CM | POA: Insufficient documentation

## 2018-01-12 MED ORDER — FLUCONAZOLE 150 MG PO TABS: ORAL_TABLET | ORAL | 2 refills | Status: DC

## 2018-01-13 LAB — HEMOGLOBIN A1C
HEMOGLOBIN A1C: 5.3 %{Hb} (ref <5.7)
MEAN PLASMA GLUCOSE: 105 (calc)
eAG (mmol/L): 5.8 (calc)

## 2018-01-13 LAB — CBC WITH DIFFERENTIAL/PLATELET
Basophils Absolute: 82 cells/uL (ref 0–200)
Basophils Relative: 1.2 %
EOS ABS: 252 {cells}/uL (ref 15–500)
Eosinophils Relative: 3.7 %
HEMATOCRIT: 44.2 % (ref 38.5–50.0)
Hemoglobin: 15.2 g/dL (ref 13.2–17.1)
LYMPHS ABS: 2502 {cells}/uL (ref 850–3900)
MCH: 32.5 pg (ref 27.0–33.0)
MCHC: 34.4 g/dL (ref 32.0–36.0)
MCV: 94.4 fL (ref 80.0–100.0)
MPV: 11.1 fL (ref 7.5–12.5)
Monocytes Relative: 8.3 %
NEUTROS PCT: 50 %
Neutro Abs: 3400 cells/uL (ref 1500–7800)
Platelets: 246 10*3/uL (ref 140–400)
RBC: 4.68 10*6/uL (ref 4.20–5.80)
RDW: 11.6 % (ref 11.0–15.0)
TOTAL LYMPHOCYTE: 36.8 %
WBC: 6.8 10*3/uL (ref 3.8–10.8)
WBCMIX: 564 {cells}/uL (ref 200–950)

## 2018-01-13 LAB — COMPLETE METABOLIC PANEL WITH GFR
AG RATIO: 2 (calc) (ref 1.0–2.5)
ALT: 7 U/L — ABNORMAL LOW (ref 9–46)
AST: 13 U/L (ref 10–35)
Albumin: 4.7 g/dL (ref 3.6–5.1)
Alkaline phosphatase (APISO): 60 U/L (ref 40–115)
BILIRUBIN TOTAL: 0.6 mg/dL (ref 0.2–1.2)
BUN: 12 mg/dL (ref 7–25)
CALCIUM: 9.8 mg/dL (ref 8.6–10.3)
CHLORIDE: 100 mmol/L (ref 98–110)
CO2: 31 mmol/L (ref 20–32)
Creat: 0.89 mg/dL (ref 0.70–1.18)
GFR, EST AFRICAN AMERICAN: 96 mL/min/{1.73_m2} (ref >=60)
GFR, Est Non African American: 83 mL/min/{1.73_m2} (ref >=60)
GLOBULIN: 2.3 g/dL (ref 1.9–3.7)
Glucose, Bld: 81 mg/dL (ref 65–99)
POTASSIUM: 4.7 mmol/L (ref 3.5–5.3)
SODIUM: 136 mmol/L (ref 135–146)
Total Protein: 7 g/dL (ref 6.1–8.1)

## 2018-01-13 LAB — VITAMIN D 25 HYDROXY (VIT D DEFICIENCY, FRACTURES): Vit D, 25-Hydroxy: 82 ng/mL (ref 30–100)

## 2018-01-13 LAB — TSH: TSH: 0.83 mIU/L (ref 0.40–4.50)

## 2018-01-13 LAB — MAGNESIUM: Magnesium: 2 mg/dL (ref 1.5–2.5)

## 2018-01-13 LAB — INSULIN, RANDOM: Insulin: 1.9 u[IU]/mL — ABNORMAL LOW (ref 2.0–19.6)

## 2018-02-01 ENCOUNTER — Other Ambulatory Visit: Payer: Self-pay | Admitting: Internal Medicine

## 2018-02-01 DIAGNOSIS — E782 Mixed hyperlipidemia: Secondary | ICD-10-CM

## 2018-03-23 NOTE — Progress Notes (Addendum)
MEDICARE ANNUAL WELLNESS VISIT AND FOLLOW UP Assessment:    Encounter for Medicare annual wellness exam  Essential hypertension Continue medication Monitor blood pressure at home; call if consistently over 130/80 Continue DASH diet.   Reminder to go to the ER if any CP, SOB, nausea, dizziness, severe HA, changes vision/speech, left arm numbness and tingling and jaw pain.  Chronic obstructive pulmonary disease, unspecified COPD type (HCC) Emphysema on imaging; stable without inhalers. Continue to monitor  OSA (obstructive sleep apnea) Previously on CPAP; hasn't used in years. No observed apnea by wife, ? Need after weight loss.   Recommend follow up sleep study but declines  SDAT (senile dementia of Alzheimer's type) Continue to monitor -  Balance is declining, discussed PT, declines No longer treated by namenda/aricept  BMI 23.0-23.9, adult  Glaucoma, unspecified glaucoma type, unspecified laterality Continue follow up with ophthalmology  Hyperlipidemia Continue medications Continue low cholesterol diet and exercise.   -     Lipid panel -     TSH  Medication management -     CBC with Differential/Platelet -     CMP/GFR  Other abnormal glucose Recent A1Cs at goal Discussed diet/exercise, weight management  Defer A1C; check CMP -     CMP/GFR  Vitamin D deficiency Continue supplementation for goal of 70-100 At goal at recent check; defer vitamin D level  Excessive cerumen, bilateral - stop using Qtips, irrigation used in the office without complications, use OTC drops/oil and irrigation at home to prevent reoccurence   Over 30 minutes of exam, counseling, chart review, and critical decision making was performed  Future Appointments  Date Time Provider Department Center  07/22/2018 10:00 AM Lucky CowboyMcKeown, [name], MD [id number] None    Plan:   During the course of the visit the patient was educated and counseled about appropriate screening and preventive services  including:    Pneumococcal vaccine   Influenza vaccine  Prevnar 13  Td vaccine  Screening electrocardiogram  Colorectal cancer screening  Diabetes screening  Glaucoma screening  Nutrition counseling    Subjective:  Cameron Mcclain is a 78 y.o. male who presents for Medicare Annual Wellness Visit and 3 month follow up for HTN, hyperlipidemia, glucose management, and vitamin D Def.   Patient has had a progressive Dementia for several years and wife has discontinued his Aricept & Namenda for concern of perceived side-effects and lack of perceived benefits.  Patient has a very short term recall, but does remain independent in personal ADL's w/ supervision and guidance by his wife. Head CT scan in 2013 showed atrophy.   He has OCA formerly on CPAP, but ongoing need for CPAP for OSA since weight loss has been questioned; wife has been monitoring without observed episodes of apnea.   BMI is Body mass index is 23.97 kg/m., he has not been working on diet and exercise. Wt Readings from Last 3 Encounters:  03/25/18 160 lb (72.6 kg)  01/12/18 155 lb 12.8 oz (70.7 kg)  10/08/17 155 lb (70.3 kg)   His blood pressure has been controlled at home, today their BP is BP: 110/76 He does not workout. He denies chest pain, shortness of breath, dizziness.   He is on cholesterol medication (gemfibrozil, zetia, omega 3) and denies myalgias. His cholesterol is not at goal. The cholesterol last visit was:   Lab Results  Component Value Date   CHOL 182 10/08/2017   HDL 52 10/08/2017   LDLCALC 113 (H) 10/08/2017   TRIG 80 10/08/2017  CHOLHDL 3.5 10/08/2017   He has not been working on diet and exercise for glucose management, and denies increased appetite, nausea, paresthesia of the feet, polydipsia, polyuria, visual disturbances, vomiting and weight loss. Last A1C in the office was:  Lab Results  Component Value Date   HGBA1C 5.3 01/12/2018   Last GFR Lab Results  Component Value Date    GFRNONAA 83 01/12/2018    Patient is on Vitamin D supplement and at goal as last check:    Lab Results  Component Value Date   VD25OH 82 01/12/2018       Medication Review: Current Outpatient Medications on File Prior to Visit  Medication Sig Dispense Refill  . calcium carbonate (OS-CAL) 600 MG tablet Takes 1 tab / day    . Cholecalciferol (VITAMIN D) 2000 units CAPS Takes 1 cap 2 x / day    . ezetimibe (ZETIA) 10 MG tablet TAKE 1 TABLET BY MOUTH DAILY FOR CHOLESTEROL. CONTINUE GEMFIBROZIL 90 tablet 1  . fluconazole (DIFLUCAN) 150 MG tablet Take 1 tablet 2 x /week on Mon / Thurs for Yeast Infection 8 tablet 2  . gemfibrozil (LOPID) 600 MG tablet TAKE 1 TABLET BY MOUTH TWICE A DAY BEFORE A MEAL 180 tablet 1  . ketoconazole (NIZORAL) 2 % cream APPLY EXTERNALLY TO THE AFFECTED AREA TWICE DAILY 60 g 3  . latanoprost (XALATAN) 0.005 % ophthalmic solution Place 1 drop into both eyes at bedtime.     Marland Kitchen loratadine (CLARITIN) 10 MG tablet Take 10 mg by mouth daily.    . Magnesium 250 MG TABS Takes 1 tab / day  0  . Omega-3 Fatty Acids (FISH OIL) 1000 MG CAPS Take 1 capsule (1,000 mg total) by mouth 2 (two) times daily.  0  . TURMERIC PO Take by mouth daily.     No current facility-administered medications on file prior to visit.     Allergies: Allergies  Allergen Reactions  . Benadryl [Diphenhydramine Hcl] Other (See Comments)    Hallucinations, itching, shaking    Current Problems (verified) has Essential hypertension; Hyperlipidemia; Other abnormal glucose; Vitamin D deficiency; SDAT (senile dementia of Alzheimer's type) (HCC); Medication management; Glaucoma; OSA (obstructive sleep apnea); BMI 23.0-23.9, adult; Chronic obstructive pulmonary disease (HCC); Encounter for Medicare annual wellness exam; and Former smoker on their problem list.  Screening Tests Immunization History  Administered Date(s) Administered  . Influenza Whole 11/23/2012  . Influenza, High Dose Seasonal PF  11/23/2013, 10/19/2014, 10/26/2015, 11/05/2016, 10/29/2017  . Pneumococcal Conjugate-13 01/12/2014  . Pneumococcal Polysaccharide-23 12/02/2008  . Td 09/26/2010  . Zoster 02/25/2008   Preventative care: Last colonoscopy: 2008, - no further follow up per Dr. Cathrine Muster CT head 2013 atrophy CT angio chest 2015 - emphysema   DECLINES ALL SCANS/SCREENING  Prior vaccinations: TD or Tdap: 2012  Influenza: 2019  Pneumococcal: 2010 Prevnar13: 2015 Shingles/Zostavax: 2010  Names of Other Physician/Practitioners you currently use: 1. [facility] here for primary care 2. Dr. Emily Filbert, eye doctor, last visit 01/07/2018, glaucoma, stable  3. Dr. Sharma Covert, 2019, q 6 months  Patient Care Team: Lucky Cowboy, MD as PCP - General (Internal Medicine)  Surgical: He  has a past surgical history that includes Pilonidal cyst / sinus excision (1959) and axillary (Right, 1986). Family His family history includes Cancer in his father; Diabetes in his mother; Hypertension in his father and mother. Social history  He reports that he quit smoking about 8 years ago. He has never used smokeless tobacco. He  reports current alcohol use. He reports that he does not use drugs.  MEDICARE WELLNESS OBJECTIVES: Physical activity: Current Exercise Habits: The patient does not participate in regular exercise at present, Exercise limited by: orthopedic condition(s);psychological condition(s) Cardiac risk factors: Cardiac Risk Factors include: advanced age (>36men, >65 women);male gender;hypertension;obesity (BMI >30kg/m2);dyslipidemia;sedentary lifestyle;smoking/ tobacco exposure Depression/mood screen:   Depression screen [facility] 2/9 03/25/2018  Decreased Interest 0  Down, Depressed, Hopeless 0  PHQ - 2 Score 0    ADLs:  In your present state of health, do you have any difficulty performing the following activities: 03/25/2018 01/12/2018  Hearing? N N  Vision? N N  Difficulty  concentrating or making decisions? Y N  Walking or climbing stairs? N N  Dressing or bathing? Y N  Comment wife assists -  Doing errands, shopping? Y N  Comment wife drives -  Quarry manager and eating ? Y -  Using the Toilet? N -  In the past six months, have you accidently leaked urine? N -  Do you have problems with loss of bowel control? N -  Managing your Medications? Y -  Managing your Finances? Y -  Housekeeping or managing your Housekeeping? Y -  Comment can manage with supervision from wife, still helps with vaccuming and yardwork -  Some recent data might be hidden     Cognitive Testing  Alert? Yes  Normal Appearance?Yes  Oriented to person? Yes  Place? Yes   Time? Yes  Recall of three objects?  No - cannot recall 0/3 on immediate recall  Can perform simple calculations? Yes  Displays appropriate judgment?Yes  Can read the correct time from a watch face?Yes  EOL planning: Does Patient Have a Medical Advance Directive?: Yes Type of Advance Directive: Healthcare Power of Attorney, Living will Does patient want to make changes to medical advance directive?: No - Patient declined Copy of Healthcare Power of Attorney in Chart?: No - copy requested   Objective:   Today's Vitals   03/25/18 1032  BP: 110/76  Pulse: 66  Temp: (!) 97.3 F (36.3 C)  SpO2: 96%  Weight: 160 lb (72.6 kg)  Height: 5' 8.5" (1.74 m)   Body mass index is 23.97 kg/m.  General appearance: alert, no distress, WD/WN, male HEENT: normocephalic, sclerae anicteric, ext aud canals bilaterally obstructed by soft wax, nares patent, no discharge or erythema, pharynx normal Oral cavity: MMM, no lesions Neck: supple, no lymphadenopathy, no thyromegaly, no masses Heart: RRR, normal S1, S2, no murmurs Lungs: CTA bilaterally, no wheezes, rhonchi, or rales Abdomen: +bs, soft, non tender, non distended, no masses, no hepatomegaly, no splenomegaly Musculoskeletal: nontender, no swelling, no obvious  deformity Extremities: no edema, no cyanosis, no clubbing Pulses: 2+ symmetric, upper and lower extremities, normal cap refill Neurological: alert, oriented x 3, CN2-12 intact, strength normal upper extremities and lower extremities, sensation normal throughout, DTRs 2+ throughout, no cerebellar signs, gait normal Psychiatric: normal affect, behavior normal, pleasant, short term memory extremely poor- 0/3 word on immediate recall, insight appropriate  Medicare Attestation I have personally reviewed: The patient's medical and social history Their use of alcohol, tobacco or illicit drugs Their current medications and supplements The patient's functional ability including ADLs,fall risks, home safety risks, cognitive, and hearing and visual impairment Diet and physical activities Evidence for depression or mood disorders  The patient's weight, height, BMI, and visual acuity have been recorded in the chart.  I have made referrals, counseling, and provided education to the patient based on review of  the above and I have provided the patient with a written personalized care plan for preventive services.     Dan Maker, NP   03/25/2018

## 2018-03-25 ENCOUNTER — Encounter: Payer: Self-pay | Admitting: Adult Health

## 2018-03-25 ENCOUNTER — Ambulatory Visit (INDEPENDENT_AMBULATORY_CARE_PROVIDER_SITE_OTHER): Payer: PPO | Admitting: Adult Health

## 2018-03-25 VITALS — BP 110/76 | HR 66 | Temp 97.3°F | Ht 68.5 in | Wt 160.0 lb

## 2018-03-25 DIAGNOSIS — Z0001 Encounter for general adult medical examination with abnormal findings: Secondary | ICD-10-CM | POA: Diagnosis not present

## 2018-03-25 DIAGNOSIS — G301 Alzheimer's disease with late onset: Secondary | ICD-10-CM

## 2018-03-25 DIAGNOSIS — H409 Unspecified glaucoma: Secondary | ICD-10-CM | POA: Diagnosis not present

## 2018-03-25 DIAGNOSIS — Z6823 Body mass index (BMI) 23.0-23.9, adult: Secondary | ICD-10-CM | POA: Diagnosis not present

## 2018-03-25 DIAGNOSIS — Z87891 Personal history of nicotine dependence: Secondary | ICD-10-CM | POA: Diagnosis not present

## 2018-03-25 DIAGNOSIS — Z0000 Encounter for general adult medical examination without abnormal findings: Secondary | ICD-10-CM

## 2018-03-25 DIAGNOSIS — Z79899 Other long term (current) drug therapy: Secondary | ICD-10-CM | POA: Diagnosis not present

## 2018-03-25 DIAGNOSIS — H6123 Impacted cerumen, bilateral: Secondary | ICD-10-CM

## 2018-03-25 DIAGNOSIS — R6889 Other general symptoms and signs: Secondary | ICD-10-CM

## 2018-03-25 DIAGNOSIS — I10 Essential (primary) hypertension: Secondary | ICD-10-CM

## 2018-03-25 DIAGNOSIS — E559 Vitamin D deficiency, unspecified: Secondary | ICD-10-CM

## 2018-03-25 DIAGNOSIS — G4733 Obstructive sleep apnea (adult) (pediatric): Secondary | ICD-10-CM

## 2018-03-25 DIAGNOSIS — E782 Mixed hyperlipidemia: Secondary | ICD-10-CM

## 2018-03-25 DIAGNOSIS — R7309 Other abnormal glucose: Secondary | ICD-10-CM

## 2018-03-25 DIAGNOSIS — J449 Chronic obstructive pulmonary disease, unspecified: Secondary | ICD-10-CM | POA: Diagnosis not present

## 2018-03-25 DIAGNOSIS — F0280 Dementia in other diseases classified elsewhere without behavioral disturbance: Secondary | ICD-10-CM | POA: Diagnosis not present

## 2018-03-25 NOTE — Patient Instructions (Addendum)
Cameron Mcclain , Thank you for taking time to come for your Medicare Wellness Visit. I appreciate your ongoing commitment to your health goals. Please review the following plan we discussed and let me know if I can assist you in the future.   These are the goals we discussed: Goals    . DIET - INCREASE WATER INTAKE     5 glasses daily     . LDL CALC < 130       This is a list of the screening recommended for you and due dates:  Health Maintenance  Topic Date Due  . Tetanus Vaccine  09/25/2020  . Flu Shot  Completed  . Pneumonia vaccines  Completed     Aim for 7+ servings of fruits and vegetables daily  Limit to max 1 drink of alcohol per day; avoid smoking/tobacco  Limit animal fats in diet for cholesterol and heart health - choose grass fed whenever available  Avoid highly processed foods, and foods high in saturated/trans fats  Aim for low stress - take time to unwind and care for your mental health  Aim for 150 min of moderate intensity exercise weekly for heart health, and weights twice weekly for bone health  Aim for 7-9 hours of sleep daily     Exercises To Do While Sitting  Exercises that you do while sitting (chair exercises) can give you many of the same benefits as full exercise. Benefits include strengthening your heart, burning calories, and keeping muscles and joints healthy. Exercise can also improve your mood and help with depression and anxiety. You may benefit from chair exercises if you are unable to do standing exercises because of:  Diabetic foot pain.  Obesity.  Illness.  Arthritis.  Recovery from surgery or injury.  Breathing problems.  Balance problems.  Another type of disability. Before starting chair exercises, check with your health care provider or a physical therapist to find out how much exercise you can tolerate and which exercises are safe for you. If your health care provider approves:  Start out slowly and build up over time.  Aim to work up to about 10-20 minutes for each exercise session.  Make exercise part of your daily routine.  Drink water when you exercise. Do not wait until you are thirsty. Drink every 10-15 minutes.  Stop exercising right away if you have pain, nausea, shortness of breath, or dizziness.  If you are exercising in a wheelchair, make sure to lock the wheels.  Ask your health care provider whether you can do tai chi or yoga. Many positions in these mind-body exercises can be modified to do while seated. Warm-up Before starting other exercises: 1. Sit up as straight as you can. Have your knees bent at 90 degrees, which is the shape of the capital letter "L." Keep your feet flat on the floor. 2. Sit at the front edge of your chair, if you can. 3. Pull in (tighten) the muscles in your abdomen and stretch your spine and neck as straight as you can. Hold this position for a few minutes. 4. Breathe in and out evenly. Try to concentrate on your breathing, and relax your mind. Stretching Exercise A: Arm stretch 1. Hold your arms out straight in front of your body. 2. Bend your hands at the wrist with your fingers pointing up, as if signaling someone to stop. Notice the slight tension in your forearms as you hold the position. 3. Keeping your arms out and your hands bent,  rotate your hands outward as far as you can and hold this stretch. Aim to have your thumbs pointing up and your pinkie fingers pointing down. Slowly repeat arm stretches for one minute as tolerated. Exercise B: Leg stretch 1. If you can move your legs, try to "draw" letters on the floor with the toes of your foot. Write your name with one foot. 2. Write your name with the toes of your other foot. Slowly repeat the movements for one minute as tolerated. Exercise C: Reach for the sky 1. Reach your hands as far over your head as you can to stretch your spine. 2. Move your hands and arms as if you are climbing a rope. Slowly repeat  the movements for one minute as tolerated. Range of motion exercises Exercise A: Shoulder roll 1. Let your arms hang loosely at your sides. 2. Lift just your shoulders up toward your ears, then let them relax back down. 3. When your shoulders feel loose, rotate your shoulders in backward and forward circles. Do shoulder rolls slowly for one minute as tolerated. Exercise B: March in place 1. As if you are marching, pump your arms and lift your legs up and down. Lift your knees as high as you can. ? If you are unable to lift your knees, just pump your arms and move your ankles and feet up and down. March in place for one minute as tolerated. Exercise C: Seated jumping jacks 1. Let your arms hang down straight. 2. Keeping your arms straight, lift them up over your head. Aim to point your fingers to the ceiling. 3. While you lift your arms, straighten your legs and slide your heels along the floor to your sides, as wide as you can. 4. As you bring your arms back down to your sides, slide your legs back together. ? If you are unable to use your legs, just move your arms. Slowly repeat seated jumping jacks for one minute as tolerated. Strengthening exercises Exercise A: Shoulder squeeze 1. Hold your arms straight out from your body to your sides, with your elbows bent and your fists pointed at the ceiling. 2. Keeping your arms in the bent position, move them forward so your elbows and forearms meet in front of your face. 3. Open your arms back out as wide as you can with your elbows still bent, until you feel your shoulder blades squeezing together. Hold for 5 seconds. Slowly repeat the movements forward and backward for one minute as tolerated. Contact a health care provider if you:  Had to stop exercising due to any of the following: ? Pain. ? Nausea. ? Shortness of breath. ? Dizziness. ? Fatigue.  Have significant pain or soreness after exercising. Get help right away if you  have:  Chest pain.  Difficulty breathing. These symptoms may represent a serious problem that is an emergency. Do not wait to see if the symptoms will go away. Get medical help right away. Call your local emergency services (911 in the [place] Do not drive yourself to the hospital. This information is not intended to replace advice given to you by your health care provider. Make sure you discuss any questions you have with your health care provider. Document Released: 12/04/2016 Document Revised: 12/04/2016 Document Reviewed: 12/04/2016 Elsevier Interactive Patient Education  2019 [employer].

## 2018-03-26 LAB — CBC WITH DIFFERENTIAL/PLATELET
Absolute Monocytes: 593 cells/uL (ref 200–950)
Basophils Absolute: 90 cells/uL (ref 0–200)
Basophils Relative: 1.3 %
Eosinophils Absolute: 262 cells/uL (ref 15–500)
Eosinophils Relative: 3.8 %
HEMATOCRIT: 44 % (ref 38.5–50.0)
HEMOGLOBIN: 14.7 g/dL (ref 13.2–17.1)
Lymphs Abs: 3022 cells/uL (ref 850–3900)
MCH: 31.7 pg (ref 27.0–33.0)
MCHC: 33.4 g/dL (ref 32.0–36.0)
MCV: 95 fL (ref 80.0–100.0)
MPV: 11.3 fL (ref 7.5–12.5)
Monocytes Relative: 8.6 %
NEUTROS ABS: 2933 {cells}/uL (ref 1500–7800)
Neutrophils Relative %: 42.5 %
Platelets: 251 10*3/uL (ref 140–400)
RBC: 4.63 10*6/uL (ref 4.20–5.80)
RDW: 11.8 % (ref 11.0–15.0)
Total Lymphocyte: 43.8 %
WBC: 6.9 10*3/uL (ref 3.8–10.8)

## 2018-03-26 LAB — COMPLETE METABOLIC PANEL WITH GFR
AG Ratio: 1.8 (calc) (ref 1.0–2.5)
ALT: 10 U/L (ref 9–46)
AST: 16 U/L (ref 10–35)
Albumin: 4.4 g/dL (ref 3.6–5.1)
Alkaline phosphatase (APISO): 57 U/L (ref 35–144)
BUN: 15 mg/dL (ref 7–25)
CO2: 29 mmol/L (ref 20–32)
Calcium: 9.5 mg/dL (ref 8.6–10.3)
Chloride: 101 mmol/L (ref 98–110)
Creat: 0.88 mg/dL (ref 0.70–1.18)
GFR, Est African American: 97 mL/min/{1.73_m2} (ref >=60)
GFR, Est Non African American: 83 mL/min/{1.73_m2} (ref >=60)
GLOBULIN: 2.5 g/dL (ref 1.9–3.7)
Glucose, Bld: 77 mg/dL (ref 65–99)
Potassium: 4.7 mmol/L (ref 3.5–5.3)
Sodium: 137 mmol/L (ref 135–146)
TOTAL PROTEIN: 6.9 g/dL (ref 6.1–8.1)
Total Bilirubin: 0.7 mg/dL (ref 0.2–1.2)

## 2018-03-26 LAB — LIPID PANEL
Cholesterol: 180 mg/dL (ref <200)
HDL: 49 mg/dL (ref >=40)
LDL Cholesterol (Calc): 114 mg/dL (calc) — ABNORMAL HIGH
Non-HDL Cholesterol (Calc): 131 mg/dL (calc) — ABNORMAL HIGH (ref <130)
TRIGLYCERIDES: 74 mg/dL (ref <150)
Total CHOL/HDL Ratio: 3.7 (calc) (ref <5.0)

## 2018-03-26 LAB — TSH: TSH: 0.66 mIU/L (ref 0.40–4.50)

## 2018-06-18 DIAGNOSIS — H2513 Age-related nuclear cataract, bilateral: Secondary | ICD-10-CM | POA: Diagnosis not present

## 2018-06-18 DIAGNOSIS — H401131 Primary open-angle glaucoma, bilateral, mild stage: Secondary | ICD-10-CM | POA: Diagnosis not present

## 2018-06-22 ENCOUNTER — Other Ambulatory Visit: Payer: Self-pay | Admitting: Internal Medicine

## 2018-07-22 ENCOUNTER — Encounter: Payer: Self-pay | Admitting: Internal Medicine

## 2018-08-17 ENCOUNTER — Other Ambulatory Visit: Payer: Self-pay | Admitting: Internal Medicine

## 2018-08-17 DIAGNOSIS — E782 Mixed hyperlipidemia: Secondary | ICD-10-CM

## 2018-08-17 MED ORDER — EZETIMIBE 10 MG PO TABS: ORAL_TABLET | ORAL | 3 refills | Status: DC

## 2018-09-17 ENCOUNTER — Other Ambulatory Visit: Payer: Self-pay | Admitting: Internal Medicine

## 2018-09-17 DIAGNOSIS — B3749 Other urogenital candidiasis: Secondary | ICD-10-CM

## 2018-10-15 ENCOUNTER — Other Ambulatory Visit: Payer: Self-pay

## 2018-10-15 ENCOUNTER — Ambulatory Visit (INDEPENDENT_AMBULATORY_CARE_PROVIDER_SITE_OTHER): Payer: PPO

## 2018-10-15 VITALS — Temp 96.9°F

## 2018-10-15 DIAGNOSIS — Z23 Encounter for immunization: Secondary | ICD-10-CM

## 2018-10-15 NOTE — Progress Notes (Signed)
Patient presents to the office for HD Flu Vaccine. Vaccine administered to Right Deltoid without any complication. Temperature taken and recorded.  

## 2018-11-11 ENCOUNTER — Encounter: Payer: Self-pay | Admitting: Internal Medicine

## 2018-11-11 ENCOUNTER — Other Ambulatory Visit: Payer: Self-pay

## 2018-11-11 ENCOUNTER — Ambulatory Visit (INDEPENDENT_AMBULATORY_CARE_PROVIDER_SITE_OTHER): Payer: PPO | Admitting: Internal Medicine

## 2018-11-11 VITALS — BP 124/68 | HR 80 | Temp 97.0°F | Resp 16 | Ht 68.5 in | Wt 159.8 lb

## 2018-11-11 DIAGNOSIS — J449 Chronic obstructive pulmonary disease, unspecified: Secondary | ICD-10-CM

## 2018-11-11 DIAGNOSIS — Z0001 Encounter for general adult medical examination with abnormal findings: Secondary | ICD-10-CM

## 2018-11-11 DIAGNOSIS — Z1211 Encounter for screening for malignant neoplasm of colon: Secondary | ICD-10-CM

## 2018-11-11 DIAGNOSIS — Z8249 Family history of ischemic heart disease and other diseases of the circulatory system: Secondary | ICD-10-CM

## 2018-11-11 DIAGNOSIS — I700 Atherosclerosis of aorta: Secondary | ICD-10-CM | POA: Diagnosis not present

## 2018-11-11 DIAGNOSIS — Z0000 Encounter for general adult medical examination without abnormal findings: Secondary | ICD-10-CM | POA: Diagnosis not present

## 2018-11-11 DIAGNOSIS — Z79899 Other long term (current) drug therapy: Secondary | ICD-10-CM

## 2018-11-11 DIAGNOSIS — R7303 Prediabetes: Secondary | ICD-10-CM | POA: Insufficient documentation

## 2018-11-11 DIAGNOSIS — E782 Mixed hyperlipidemia: Secondary | ICD-10-CM

## 2018-11-11 DIAGNOSIS — N138 Other obstructive and reflux uropathy: Secondary | ICD-10-CM

## 2018-11-11 DIAGNOSIS — Z87891 Personal history of nicotine dependence: Secondary | ICD-10-CM | POA: Diagnosis not present

## 2018-11-11 DIAGNOSIS — E559 Vitamin D deficiency, unspecified: Secondary | ICD-10-CM

## 2018-11-11 DIAGNOSIS — R7309 Other abnormal glucose: Secondary | ICD-10-CM

## 2018-11-11 DIAGNOSIS — F0150 Vascular dementia without behavioral disturbance: Secondary | ICD-10-CM

## 2018-11-11 DIAGNOSIS — Z136 Encounter for screening for cardiovascular disorders: Secondary | ICD-10-CM

## 2018-11-11 DIAGNOSIS — F0391 Unspecified dementia with behavioral disturbance: Secondary | ICD-10-CM | POA: Insufficient documentation

## 2018-11-11 DIAGNOSIS — I10 Essential (primary) hypertension: Secondary | ICD-10-CM | POA: Diagnosis not present

## 2018-11-11 DIAGNOSIS — F03B18 Unspecified dementia, moderate, with other behavioral disturbance: Secondary | ICD-10-CM | POA: Insufficient documentation

## 2018-11-11 DIAGNOSIS — G4733 Obstructive sleep apnea (adult) (pediatric): Secondary | ICD-10-CM

## 2018-11-11 DIAGNOSIS — Z125 Encounter for screening for malignant neoplasm of prostate: Secondary | ICD-10-CM

## 2018-11-11 NOTE — Patient Instructions (Signed)

## 2018-11-11 NOTE — Progress Notes (Signed)
Annual  Screening/Preventative Visit  & Comprehensive Evaluation & Examination     This very nice 78 y.o. MWM  presents for a Screening /Preventative Visit & comprehensive evaluation and management of multiple medical co-morbidities.  Patient has been followed for HTN, HLD, Vascular Dementia, Prediabetes and Vitamin D Deficiency. Patient has hx/o OSA, but doesn't use it consequent of  Mask intolerance.      Patient has known hx/o Vascular Dementia and both Aricept & Namenda were d/c'd for lack of perceived benefit. Patient manages personal hygiene and wife supervises activities otherwise. In 2013, Head CT scan showed Atrophy.      HTN predates since 1999. Patient's BP has been controlled at home.  Today's BP is at goal - 124/68. Patient denies any cardiac symptoms as chest pain, palpitations, shortness of breath, dizziness or ankle swelling.     Patient's hyperlipidemia is not controlled with diet and Gemfibrozil / Zetia (as his wife refuses to let him take a Statin for fear of making his Dementia worse). Last lipids were not at goal:  Lab Results  Component Value Date   CHOL 180 03/25/2018   HDL 49 03/25/2018   LDLCALC 114 (H) 03/25/2018   TRIG 74 03/25/2018   CHOLHDL 3.7 03/25/2018      Patient has hx/o prediabetes  (A1c 5.8% / 2011)  and patient denies reactive hypoglycemic symptoms, visual blurring, diabetic polys or paresthesias. Last A1c was Normal & at goal:  Lab Results  Component Value Date   HGBA1C 5.3 01/12/2018       Finally, patient has history of Vitamin D Deficiency  ("24" / 2008)  and last vitamin D was at goal:  Lab Results  Component Value Date   VD25OH 82 01/12/2018   Current Outpatient Medications on File Prior to Visit  Medication Sig  . calcium carbonate (OS-CAL) 600 MG tablet Takes 1 tab / day  . Cholecalciferol (VITAMIN D) 2000 units CAPS Takes 1 cap 2 x / day  . ezetimibe (ZETIA) 10 MG tablet Take 1 tablet Daily for Cholesterol  . gemfibrozil (LOPID)  600 MG tablet TAKE 1 TABLET BY MOUTH TWICE DAILY BEFORE A MEAL  . latanoprost (XALATAN) 0.005 % ophthalmic solution Place 1 drop into both eyes at bedtime.   Marland Kitchen. loratadine (CLARITIN) 10 MG tablet Take 10 mg by mouth daily.  . Magnesium 250 MG TABS Takes 1 tab / day  . Omega-3 Fatty Acids (FISH OIL) 1000 MG CAPS Take 1 capsule (1,000 mg total) by mouth 2 (two) times daily.  . TURMERIC PO Take by mouth daily.  . fluconazole (DIFLUCAN) 150 MG tablet Take 1 tablet 2 x  /week (Mon & Thurs) for Yeast Infection  . ketoconazole (NIZORAL) 2 % cream APPLY EXTERNALLY TO THE AFFECTED AREA TWICE DAILY   No current facility-administered medications on file prior to visit.    Allergies  Allergen Reactions  . Benadryl [Diphenhydramine Hcl] Other (See Comments)    Hallucinations, itching, shaking   Past Medical History:  Diagnosis Date  . Glaucoma   . OSA (obstructive sleep apnea)   . RLS (restless legs syndrome)   . Vitamin D deficiency    Health Maintenance  Topic Date Due  . TETANUS/TDAP  09/25/2020  . INFLUENZA VACCINE  Completed  . PNA vac Low Risk Adult  Completed   Immunization History  Administered Date(s) Administered  . Influenza Whole 11/23/2012  . Influenza, High Dose Seasonal PF 11/23/2013, 10/19/2014, 10/26/2015, 11/05/2016, 10/29/2017, 10/15/2018  . Pneumococcal Conjugate-13 01/12/2014  .  Pneumococcal Polysaccharide-23 12/02/2008  . Td 09/26/2010  . Zoster 02/25/2008   Last Colon - 10/23/2006 - Dr Arlyce Dice - recc no f/u   Past Surgical History:  Procedure Laterality Date  . axillary Right 1986   biopsy of axillary nodes  . PILONIDAL CYST / SINUS EXCISION  1959   Family History  Problem Relation Age of Onset  . Hypertension Mother   . Diabetes Mother   . Hypertension Father   . Cancer Father    Social History   Socioeconomic History  . Marital status: Married    Spouse name: Charlcie Cradle / caretaker  . Number of children: None  Occupational History  . Retired Hydrologist  Tobacco Use  . Smoking status: Former Smoker    Quit date: 05/10/2009    Years since quitting: 9.5  . Smokeless tobacco: Never Used  Substance and Sexual Activity  . Alcohol use: Yes    Alcohol/week: 0.0 standard drinks  . Drug use: No  . Sexual activity: Not on file    ROS Constitutional: Denies fever, chills, weight loss/gain, headaches, insomnia,  night sweats or change in appetite. Does c/o fatigue. Eyes: Denies redness, blurred vision, diplopia, discharge, itchy or watery eyes.  ENT: Denies discharge, congestion, post nasal drip, epistaxis, sore throat, earache, hearing loss, dental pain, Tinnitus, Vertigo, Sinus pain or snoring.  Cardio: Denies chest pain, palpitations, irregular heartbeat, syncope, dyspnea, diaphoresis, orthopnea, PND, claudication or edema Respiratory: denies cough, dyspnea, DOE, pleurisy, hoarseness, laryngitis or wheezing.  Gastrointestinal: Denies dysphagia, heartburn, reflux, water brash, pain, cramps, nausea, vomiting, bloating, diarrhea, constipation, hematemesis, melena, hematochezia, jaundice or hemorrhoids Genitourinary: Denies dysuria, frequency, urgency, nocturia, hesitancy, discharge, hematuria or flank pain Musculoskeletal: Denies arthralgia, myalgia, stiffness, Jt. Swelling, pain, limp or strain/sprain. Denies Falls. Skin: Denies puritis, rash, hives, warts, acne, eczema or change in skin lesion Neuro: No weakness, tremor, incoordination, spasms, paresthesia or pain Psychiatric: Denies confusion, memory loss or sensory loss. Denies Depression. Endocrine: Denies change in weight, skin, hair change, nocturia, and paresthesia, diabetic polys, visual blurring or hyper / hypo glycemic episodes.  Heme/Lymph: No excessive bleeding, bruising or enlarged lymph nodes.  Physical Exam  BP 124/68   Pulse 80   Temp (!) 97 F (36.1 C)   Resp 16   Ht 5' 8.5" (1.74 m)   Wt 159 lb 12.8 oz (72.5 kg)   BMI 23.94 kg/m   General Appearance: Well  nourished and well groomed and in no apparent distress.  Eyes: PERRLA, EOMs, conjunctiva no swelling or erythema, normal fundi and vessels. Sinuses: No frontal/maxillary tenderness ENT/Mouth: EACs patent / TMs  nl. Nares clear without erythema, swelling, mucoid exudates. Oral hygiene is good. No erythema, swelling, or exudate. Tongue normal, non-obstructing. Tonsils not swollen or erythematous. Hearing normal.  Neck: Supple, thyroid not palpable. No bruits, nodes or JVD. Respiratory: Respiratory effort normal.  BS equal and clear bilateral without rales, rhonci, wheezing or stridor. Cardio: Heart sounds are normal with regular rate and rhythm and no murmurs, rubs or gallops. Peripheral pulses are normal and equal bilaterally without edema. No aortic or femoral bruits. Chest: symmetric with normal excursions and percussion.  Abdomen: Soft, with Nl bowel sounds. Nontender, no guarding, rebound, hernias, masses, or organomegaly.  Lymphatics: Non tender without lymphadenopathy.  Musculoskeletal: Full ROM all peripheral extremities, joint stability, 5/5 strength, and normal gait. Skin: Warm and dry without rashes, lesions, cyanosis, clubbing or  ecchymosis.  Neuro: Cranial nerves intact, reflexes equal bilaterally. Normal muscle tone, no cerebellar symptoms. Sensation  intact.  Pysch: Alert and oriented X 3 with pleasant affect. Very limited short term recall. Limited insight and judgment.   Assessment and Plan  1. Annual Preventative/Screening Exam   2. Essential hypertension  - EKG 12-Lead - [place], RETROPERITNL ABD,  LTD - Urinalysis, Routine w reflex microscopic - Microalbumin / creatinine urine ratio - CBC with Differential/Platelet - COMPLETE METABOLIC PANEL WITH GFR - Magnesium - TSH  3. Hyperlipidemia, mixed  - EKG 12-Lead - [place], RETROPERITNL ABD,  LTD - Lipid panel - TSH  4. Abnormal glucose  - EKG 12-Lead - [place], RETROPERITNL ABD,  LTD - Hemoglobin A1c - Insulin, random  5.  Vitamin D deficiency  - VITAMIN D 25 Hydroxyl  6. Prediabetes  - EKG 12-Lead - [place], RETROPERITNL ABD,  LTD - Hemoglobin A1c - Insulin, random  7. Vascular dementia without behavioral disturbance ([place])   8. Chronic obstructive pulmonary disease(HCC)   9. OSA (obstructive sleep apnea)   10. Screening for colorectal cancer  - POC Hemoccult Bld/Stl   11. BPH with obstruction/lower urinary tract symptoms  - Urinalysis, Routine w reflex microscopic - PSA  12. Prostate cancer screening  - PSA  13. Screening for ischemic heart disease  - EKG 12-Lead  14. FHx: hypertension  - EKG 12-Lead - [place], RETROPERITNL ABD,  LTD  15. Former smoker  - EKG 12-Lead - [place], RETROPERITNL ABD,  LTD  16. Aortic atherosclerosis (HCC)  - [place], RETROPERITNL ABD,  LTD  17. Screening for AAA (aortic abdominal aneurysm)  - [place], RETROPERITNL ABD,  LTD  18. Medication management  - Urinalysis, Routine w reflex microscopic - Microalbumin / creatinine urine ratio - CBC with Differential/Platelet - COMPLETE METABOLIC PANEL WITH GFR - Magnesium - Lipid panel - TSH - Hemoglobin A1c - Insulin, random - VITAMIN D 25 Hydroxyl       Patient was counseled in prudent diet, weight control to achieve/maintain BMI less than 25, BP monitoring, regular exercise and medications as discussed.  Discussed med effects and SE's. Routine screening labs and tests as requested with regular follow-up as recommended. Over 40 minutes of exam, counseling, chart review and high complex critical decision making was performed   Kirtland Bouchard, MD

## 2018-11-12 LAB — CBC WITH DIFFERENTIAL/PLATELET
Absolute Monocytes: 624 cells/uL (ref 200–950)
Basophils Absolute: 104 cells/uL (ref 0–200)
Basophils Relative: 1.3 %
Eosinophils Absolute: 304 cells/uL (ref 15–500)
Eosinophils Relative: 3.8 %
HCT: 43.3 % (ref 38.5–50.0)
Hemoglobin: 14.8 g/dL (ref 13.2–17.1)
Lymphs Abs: 3288 cells/uL (ref 850–3900)
MCH: 32.5 pg (ref 27.0–33.0)
MCHC: 34.2 g/dL (ref 32.0–36.0)
MCV: 95 fL (ref 80.0–100.0)
MPV: 10.8 fL (ref 7.5–12.5)
Monocytes Relative: 7.8 %
Neutro Abs: 3680 cells/uL (ref 1500–7800)
Neutrophils Relative %: 46 %
Platelets: 295 10*3/uL (ref 140–400)
RBC: 4.56 10*6/uL (ref 4.20–5.80)
RDW: 11.6 % (ref 11.0–15.0)
Total Lymphocyte: 41.1 %
WBC: 8 10*3/uL (ref 3.8–10.8)

## 2018-11-12 LAB — LIPID PANEL
Cholesterol: 200 mg/dL — ABNORMAL HIGH (ref <200)
HDL: 54 mg/dL (ref >=40)
LDL Cholesterol (Calc): 121 mg/dL (calc) — ABNORMAL HIGH
Non-HDL Cholesterol (Calc): 146 mg/dL (calc) — ABNORMAL HIGH (ref <130)
Total CHOL/HDL Ratio: 3.7 (calc) (ref <5.0)
Triglycerides: 130 mg/dL (ref <150)

## 2018-11-12 LAB — COMPLETE METABOLIC PANEL WITH GFR
AG Ratio: 1.9 (calc) (ref 1.0–2.5)
ALT: 9 U/L (ref 9–46)
AST: 14 U/L (ref 10–35)
Albumin: 4.4 g/dL (ref 3.6–5.1)
Alkaline phosphatase (APISO): 49 U/L (ref 35–144)
BUN: 15 mg/dL (ref 7–25)
CO2: 31 mmol/L (ref 20–32)
Calcium: 9.6 mg/dL (ref 8.6–10.3)
Chloride: 97 mmol/L — ABNORMAL LOW (ref 98–110)
Creat: 0.9 mg/dL (ref 0.70–1.18)
GFR, Est African American: 95 mL/min/{1.73_m2} (ref >=60)
GFR, Est Non African American: 82 mL/min/{1.73_m2} (ref >=60)
Globulin: 2.3 g/dL (calc) (ref 1.9–3.7)
Glucose, Bld: 93 mg/dL (ref 65–99)
Potassium: 4.5 mmol/L (ref 3.5–5.3)
Sodium: 136 mmol/L (ref 135–146)
Total Bilirubin: 0.5 mg/dL (ref 0.2–1.2)
Total Protein: 6.7 g/dL (ref 6.1–8.1)

## 2018-11-12 LAB — MICROALBUMIN / CREATININE URINE RATIO
Creatinine, Urine: 74 mg/dL (ref 20–320)
Microalb Creat Ratio: 3 mcg/mg creat (ref <30)
Microalb, Ur: 0.2 mg/dL

## 2018-11-12 LAB — MAGNESIUM: Magnesium: 1.9 mg/dL (ref 1.5–2.5)

## 2018-11-12 LAB — URINALYSIS, ROUTINE W REFLEX MICROSCOPIC
Bilirubin Urine: NEGATIVE
Glucose, UA: NEGATIVE
Hgb urine dipstick: NEGATIVE
Ketones, ur: NEGATIVE
Leukocytes,Ua: NEGATIVE
Nitrite: NEGATIVE
Protein, ur: NEGATIVE
Specific Gravity, Urine: 1.011 (ref 1.001–1.03)
pH: 6.5 (ref 5.0–8.0)

## 2018-11-12 LAB — TSH: TSH: 0.61 mIU/L (ref 0.40–4.50)

## 2018-11-12 LAB — INSULIN, RANDOM: Insulin: 11.8 u[IU]/mL

## 2018-11-12 LAB — PSA: PSA: 2.3 ng/mL (ref <=4.0)

## 2018-11-12 LAB — HEMOGLOBIN A1C
Hgb A1c MFr Bld: 5.3 % of total Hgb (ref <5.7)
Mean Plasma Glucose: 105 (calc)
eAG (mmol/L): 5.8 (calc)

## 2018-11-12 LAB — VITAMIN D 25 HYDROXY (VIT D DEFICIENCY, FRACTURES): Vit D, 25-Hydroxy: 66 ng/mL (ref 30–100)

## 2018-11-14 ENCOUNTER — Encounter: Payer: Self-pay | Admitting: Internal Medicine

## 2018-11-26 ENCOUNTER — Other Ambulatory Visit: Payer: Self-pay | Admitting: Adult Health

## 2019-02-04 ENCOUNTER — Ambulatory Visit (INDEPENDENT_AMBULATORY_CARE_PROVIDER_SITE_OTHER): Payer: PPO | Admitting: Physician Assistant

## 2019-02-04 ENCOUNTER — Other Ambulatory Visit: Payer: Self-pay

## 2019-02-04 VITALS — BP 110/68 | HR 72 | Temp 97.9°F | Resp 16 | Ht 68.0 in | Wt 152.0 lb

## 2019-02-04 DIAGNOSIS — N481 Balanitis: Secondary | ICD-10-CM

## 2019-02-04 DIAGNOSIS — N485 Ulcer of penis: Secondary | ICD-10-CM

## 2019-02-04 MED ORDER — ACYCLOVIR 400 MG PO TABS: 400.0000 mg | ORAL_TABLET | Freq: Three times a day (TID) | ORAL | 0 refills | Status: AC

## 2019-02-04 MED ORDER — TRIAMCINOLONE ACETONIDE 0.5 % EX CREA: 1.0000 "application " | TOPICAL_CREAM | Freq: Two times a day (BID) | CUTANEOUS | 2 refills | Status: DC

## 2019-02-04 MED ORDER — FLUCONAZOLE 150 MG PO TABS: ORAL_TABLET | ORAL | 0 refills | Status: DC

## 2019-02-04 NOTE — Progress Notes (Signed)
Subjective:    Patient ID: Cameron Mcclain, male    DOB: 11-23-1940, 78 y.o.   MRN: [id number]  HPI 78 y.o. WM presents with rash on his penis x Oct, he was put on ketoconazole X oct but states it is not helping.  Patient has dementia and is a poor historian, son is here with him to help.  No fever, chills, no problems with urinating.  States has rash/ulcer, some burning at the ulcer.  Denies specific medication, food, skin care product, detergent, soap, or other environmental triggers have been identified.  Blood pressure 110/68, pulse 72, temperature 97.9 F (36.6 C), resp. rate 16, height 5\' 8"  (1.727 m), weight 152 lb (68.9 kg).  Medications   Current Outpatient Medications (Cardiovascular):  .  ezetimibe (ZETIA) 10 MG tablet, Take 1 tablet Daily for Cholesterol .  gemfibrozil (LOPID) 600 MG tablet, Take 1 tablet 2 x /day with Meals for Triglycerides (Blood Fats)  Current Outpatient Medications (Respiratory):  .  loratadine (CLARITIN) 10 MG tablet, Take 10 mg by mouth daily.    Current Outpatient Medications (Other):  .  calcium carbonate (OS-CAL) 600 MG tablet, Takes 1 tab / day .  Cholecalciferol (VITAMIN D) 2000 units CAPS, Takes 1 cap 2 x / day .  fluconazole (DIFLUCAN) 150 MG tablet, Take 1 tablet 2 x  /week (Mon & Thurs) for Yeast Infection .  ketoconazole (NIZORAL) 2 % cream, APPLY EXTERNALLY TO THE AFFECTED AREA TWICE DAILY .  latanoprost (XALATAN) 0.005 % ophthalmic solution, Place 1 drop into both eyes at bedtime.  .  Magnesium 250 MG TABS, Takes 1 tab / day .  Omega-3 Fatty Acids (FISH OIL) 1000 MG CAPS, Take 1 capsule (1,000 mg total) by mouth 2 (two) times daily. .  TURMERIC PO, Take by mouth daily.  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    Review of Systems See HPI    Objective:    Physical Exam  Eyes: Conjunctivae are normal.  Cardiovascular: Normal rate.  Pulmonary/Chest: Effort normal and breath sounds normal.  Abdominal: Soft. There is no abdominal tenderness.  Genitourinary:    Testes/scrotum normal.  Penis exhibits lesions. No discharge found.    Genitourinary Comments: uncircumcised penis, foreskin easily retractable, with 3 ulcers ranging from 2-4 mm on the base of his penis, no penile discharge, no other rashes on his penis, testicles are normal, some mild erythematous rash along inguinal area bilateral, patient has cream on it.    Neurological: He is alert.  Skin: Rash noted. He is not diaphoretic.  Psychiatric: Affect normal. He exhibits abnormal recent memory.         Assessment & Plan:  Tregan was seen today for rash.  Diagnoses and all orders for this visit:  Chronic ulcer of penis -     HSV(herpes simplex vrs) 1+2 ab-IgG -     RPR  Balanitis No phimosis at this time ? Herpes ulceration Will treat as yeast but also will start treatment for HSV pending lab work Need follow up 2 week if not better Discussed reasons to go tot he er Other orders -     fluconazole (DIFLUCAN) 150 MG tablet; 1 pill once a week for  3 weeks -     triamcinolone cream (KENALOG) 0.5 %; Apply 1 application topically 2 (two) times daily. -     acyclovir (ZOVIRAX) 400 MG tablet; Take 1 tablet (400 mg total) by mouth 3 (three) times daily for 10 days.

## 2019-02-08 ENCOUNTER — Ambulatory Visit: Payer: PPO | Admitting: Adult Health

## 2019-02-08 LAB — HSV(HERPES SIMPLEX VRS) I + II AB-IGG
HAV 1 IGG,TYPE SPECIFIC AB: <0.9 index
HSV 2 IGG,TYPE SPECIFIC AB: 5.54 index — ABNORMAL HIGH

## 2019-02-08 LAB — RPR: RPR Ser Ql: NONREACTIVE

## 2019-03-26 NOTE — Progress Notes (Deleted)
MEDICARE ANNUAL WELLNESS VISIT AND FOLLOW UP Assessment:    Encounter for Medicare annual wellness exam  Essential hypertension Continue medication Monitor blood pressure at home; call if consistently over 130/80 Continue DASH diet.   Reminder to go to the ER if any CP, SOB, nausea, dizziness, severe HA, changes vision/speech, left arm numbness and tingling and jaw pain.  Chronic obstructive pulmonary disease, unspecified COPD type (HCC) Emphysema on imaging; stable without inhalers. Continue to monitor  OSA (obstructive sleep apnea) Previously on CPAP; hasn't used in years. No observed apnea by wife, ? Need after weight loss.   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   -     Lipid panel -     TSH  Medication management -     CBC with Differential/Platelet -     CMP/GFR -     Magnesium   Other abnormal glucose Recent A1Cs at goal Discussed diet/exercise, weight management  Defer A1C; check CMP -     CMP/GFR  Vitamin D deficiency Continue supplementation for goal of 60-100 At goal at recent check; defer vitamin D level  BMI 23.0-23.9, adult ***  Excessive cerumen, bilateral *** - stop using Qtips, irrigation used in the office without complications, use OTC drops/oil and irrigation at home to prevent reoccurence   Over 30 minutes of exam, counseling, chart review, and critical decision making was performed  Future Appointments  Date Time Provider Department Center  03/29/2019 11:15 AM Judd Gaudier, NP [facility] None  06/28/2019  2:30 PM Lucky Cowboy, MD [facility] None    Plan:   During the course of the  visit the patient was educated and counseled about appropriate screening and preventive services including:    Pneumococcal vaccine   Influenza vaccine  Prevnar 13  Td vaccine  Screening electrocardiogram  Colorectal cancer screening  Diabetes screening  Glaucoma screening  Nutrition counseling    Subjective:  Cameron Mcclain is a 79 y.o. male who presents for Medicare Annual Wellness Visit and 3 month follow up for HTN, hyperlipidemia, glucose management, and vitamin D Def.   Patient has had a progressive Dementia for several years and wife has discontinued his Aricept & Namenda for concern of perceived side-effects and lack of perceived benefits.  Patient has a very short term recall, but does remain independent in personal ADLs with supervision and guidance by his wife. Head CT scan in 2013 showed atrophy.   He is a former smoker with evidence of emphysema per CT chest, denies any dyspnea, cough; monitoring only.   He has OCA formerly on CPAP, but ongoing need for CPAP for OSA since weight loss has been questioned; wife has been monitoring without observed episodes of apnea.   BMI is There is no height or weight on file to calculate BMI., he has not been working on diet and exercise. Wt Readings from Last 3 Encounters:  02/04/19 152 lb (68.9 kg)  11/11/18 159 lb 12.8 oz (72.5 kg)  03/25/18 160 lb (72.6 kg)   His blood pressure has been controlled at home, today their BP is   He does not workout. He denies chest pain, shortness of breath, dizziness.   He is on cholesterol medication (gemfibrozil, zetia, omega 3) and  denies myalgias. His cholesterol is not at goal. The cholesterol last visit was:   Lab Results  Component Value Date   CHOL 200 (H) 11/11/2018   HDL 54 11/11/2018   LDLCALC 121 (H) 11/11/2018   TRIG 130 11/11/2018   CHOLHDL 3.7 11/11/2018   He has not been working on diet and exercise for glucose management, and denies increased appetite, nausea,  paresthesia of the feet, polydipsia, polyuria, visual disturbances, vomiting and weight loss. Last A1C in the office was:  Lab Results  Component Value Date   HGBA1C 5.3 11/11/2018   Last GFR Lab Results  Component Value Date   GFRNONAA 82 11/11/2018    Patient is on Vitamin D supplement and at goal as last check:    Lab Results  Component Value Date   VD25OH 66 11/11/2018       Medication Review: Current Outpatient Medications on File Prior to Visit  Medication Sig Dispense Refill  . calcium carbonate (OS-CAL) 600 MG tablet Takes 1 tab / day    . Cholecalciferol (VITAMIN D) 2000 units CAPS Takes 1 cap 2 x / day    . ezetimibe (ZETIA) 10 MG tablet Take 1 tablet Daily for Cholesterol 90 tablet 3  . fluconazole (DIFLUCAN) 150 MG tablet 1 pill once a week for 3 weeks 3 tablet 0  . gemfibrozil (LOPID) 600 MG tablet Take 1 tablet 2 x /day with Meals for Triglycerides (Blood Fats) 180 tablet 1  . ketoconazole (NIZORAL) 2 % cream APPLY EXTERNALLY TO THE AFFECTED AREA TWICE DAILY 60 g 3  . latanoprost (XALATAN) 0.005 % ophthalmic solution Place 1 drop into both eyes at bedtime.     Marland Kitchen loratadine (CLARITIN) 10 MG tablet Take 10 mg by mouth daily.    . Magnesium 250 MG TABS Takes 1 tab / day  0  . Omega-3 Fatty Acids (FISH OIL) 1000 MG CAPS Take 1 capsule (1,000 mg total) by mouth 2 (two) times daily.  0  . triamcinolone cream (KENALOG) 0.5 % Apply 1 application topically 2 (two) times daily. 80 g 2  . TURMERIC PO Take by mouth daily.     No current facility-administered medications on file prior to visit.    Allergies: Allergies  Allergen Reactions  . Benadryl [Diphenhydramine Hcl] Other (See Comments)    Hallucinations, itching, shaking    Current Problems (verified) has Essential hypertension; Hyperlipidemia, mixed; Abnormal glucose; Vitamin D deficiency; Medication management; Glaucoma; OSA (obstructive sleep apnea); BMI 23.0-23.9, adult; Chronic obstructive pulmonary disease  (HCC); Encounter for Medicare annual wellness exam; Former smoker; Prediabetes; and Vascular dementia without behavioral disturbance (HCC) on their problem list.  Screening Tests Immunization History  Administered Date(s) Administered  . Influenza Whole 11/23/2012  . Influenza, High Dose Seasonal PF 11/23/2013, 10/19/2014, 10/26/2015, 11/05/2016, 10/29/2017, 10/15/2018  . Pneumococcal Conjugate-13 01/12/2014  . Pneumococcal Polysaccharide-23 12/02/2008  . Td 09/26/2010  . Zoster 02/25/2008   Preventative care: Last colonoscopy: 2008, - no further follow up per Dr. Cathrine Muster CT head 2013 atrophy CT angio chest 2015 - emphysema   DECLINES ALL SCANS/SCREENING  Prior vaccinations: TD or Tdap: 2012  Influenza: 10/2018  Pneumococcal: 2010 Prevnar13: 2015 Shingles/Zostavax: 2010  Names of Other Physician/Practitioners you currently use: 1. [facility] here for primary care 2. Dr. Emily Filbert, eye doctor, last visit 01/07/2018, glaucoma, stable  3. Dr. Sharma Covert, 2019, q 6 months  Patient Care Team: Lucky Cowboy, MD as PCP - General (Internal Medicine)  Surgical: He  has  a past surgical history that includes Pilonidal cyst / sinus excision (1959) and axillary (Right, 1986). Family His family history includes Cancer in his father; Diabetes in his mother; Hypertension in his father and mother. Social history  He reports that he quit smoking about 9 years ago. He has never used smokeless tobacco. He reports current alcohol use. He reports that he does not use drugs.  MEDICARE WELLNESS OBJECTIVES: Physical activity:   Cardiac risk factors:   Depression/mood screen:   Depression screen [facility] 2/9 11/14/2018  Decreased Interest 0  Down, Depressed, Hopeless 0  PHQ - 2 Score 0    ADLs:  In your present state of health, do you have any difficulty performing the following activities: 11/14/2018  Hearing? N  Vision? N  Difficulty concentrating or  making decisions? N  Walking or climbing stairs? N  Dressing or bathing? N  Doing errands, shopping? N  Some recent data might be hidden     Cognitive Testing  Alert? Yes  Normal Appearance?Yes  Oriented to person? Yes  Place? Yes   Time? Yes  Recall of three objects?  No - cannot recall 0/3 on immediate recall  Can perform simple calculations? Yes  Displays appropriate judgment?Yes  Can read the correct time from a watch face?Yes  EOL planning:     Objective:   There were no vitals filed for this visit. There is no height or weight on file to calculate BMI.  General appearance: alert, no distress, WD/WN, male HEENT: normocephalic, sclerae anicteric, ext aud canals bilaterally obstructed by soft wax, nares patent, no discharge or erythema, pharynx normal Oral cavity: MMM, no lesions Neck: supple, no lymphadenopathy, no thyromegaly, no masses Heart: RRR, normal S1, S2, no murmurs Lungs: CTA bilaterally, no wheezes, rhonchi, or rales Abdomen: +bs, soft, non tender, non distended, no masses, no hepatomegaly, no splenomegaly Musculoskeletal: nontender, no swelling, no obvious deformity Extremities: no edema, no cyanosis, no clubbing Pulses: 2+ symmetric, upper and lower extremities, normal cap refill Neurological: alert, oriented x 3, CN2-12 intact, strength normal upper extremities and lower extremities, sensation normal throughout, DTRs 2+ throughout, no cerebellar signs, gait normal Psychiatric: normal affect, behavior normal, pleasant, short term memory extremely poor- 0/3 word on immediate recall, insight appropriate  Medicare Attestation I have personally reviewed: The patient's medical and social history Their use of alcohol, tobacco or illicit drugs Their current medications and supplements The patient's functional ability including ADLs,fall risks, home safety risks, cognitive, and hearing and visual impairment Diet and physical activities Evidence for depression or  mood disorders  The patient's weight, height, BMI, and visual acuity have been recorded in the chart.  I have made referrals, counseling, and provided education to the patient based on review of the above and I have provided the patient with a written personalized care plan for preventive services.     Cameron Ribas, NP   03/26/2019

## 2019-03-29 ENCOUNTER — Ambulatory Visit: Payer: Self-pay | Admitting: Adult Health

## 2019-04-19 DIAGNOSIS — I700 Atherosclerosis of aorta: Secondary | ICD-10-CM | POA: Insufficient documentation

## 2019-04-19 NOTE — Progress Notes (Deleted)
MEDICARE ANNUAL WELLNESS VISIT AND FOLLOW UP Assessment:    Encounter for Medicare annual wellness exam  Atherosclerosis of aorta Per CXR 2018 Control blood pressure, cholesterol, glucose, increase exercise.   Essential hypertension Continue medication Monitor blood pressure at home; call if consistently over 130/80 Continue DASH diet.   Reminder to go to the ER if any CP, SOB, nausea, dizziness, severe HA, changes vision/speech, left arm numbness and tingling and jaw pain.  Chronic obstructive pulmonary disease, unspecified COPD type (HCC) Emphysema on imaging; stable without inhalers. Continue to monitor  OSA (obstructive sleep apnea) Previously on CPAP; hasn't used in years. No observed apnea by wife, ? Need after weight loss.   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   -     Lipid panel -     TSH  Medication management -     CBC with Differential/Platelet -     CMP/GFR -     Magnesium   Other abnormal glucose Recent A1Cs at goal Discussed diet/exercise, weight management  Defer A1C; check CMP -     CMP/GFR  Vitamin D deficiency Continue supplementation for goal of 60-100 At goal at recent check; defer vitamin D level  BMI 23.0-23.9, adult ***  Excessive cerumen, bilateral *** - stop using Qtips, irrigation used in the office without complications, use OTC drops/oil and irrigation at home to prevent reoccurence   Over 30 minutes of exam, counseling, chart review, and critical decision making was performed  Future Appointments  Date Time Provider Department Center  04/21/2019  2:00 PM Judd Gaudier, NP [facility]  None  06/28/2019  2:30 PM Lucky Cowboy, MD [facility] None    Plan:   During the course of the visit the patient was educated and counseled about appropriate screening and preventive services including:    Pneumococcal vaccine   Influenza vaccine  Prevnar 13  Td vaccine  Screening electrocardiogram  Colorectal cancer screening  Diabetes screening  Glaucoma screening  Nutrition counseling    Subjective:  Cameron Mcclain is a 79 y.o. male who presents for Medicare Annual Wellness Visit and 3 month follow up for HTN, hyperlipidemia, glucose management, and vitamin D Def.   Patient has had a progressive Dementia for several years and wife has discontinued his Aricept & Namenda for concern of perceived side-effects and lack of perceived benefits.  Patient has a very short term recall, but does remain independent in personal ADLs with supervision and guidance by his wife. Head CT scan in 2013 showed atrophy.   He is a former smoker with evidence of emphysema per CT chest/CXR, denies any dyspnea, cough; monitoring only.   He has OCA formerly on CPAP, but ongoing need for CPAP for OSA since weight loss has been questioned; wife has been monitoring without observed episodes of apnea.   BMI is There is no height or weight on file to calculate BMI., he has not been working on diet and exercise. Wt Readings from Last 3 Encounters:  02/04/19 152 lb (68.9 kg)  11/11/18 159 lb 12.8 oz (72.5 kg)  03/25/18 160 lb (72.6 kg)   He has aortic atherosclerosis per CXR 2018 His blood pressure has been controlled at home, today their BP is   He  does not workout. He denies chest pain, shortness of breath, dizziness.   He is on cholesterol medication (gemfibrozil, zetia, omega 3) and denies myalgias. His cholesterol is not at goal. The cholesterol last visit was:   Lab Results  Component Value Date   CHOL 200 (H) 11/11/2018   HDL 54 11/11/2018   LDLCALC 121 (H) 11/11/2018   TRIG 130  11/11/2018   CHOLHDL 3.7 11/11/2018   He has not been working on diet and exercise for glucose management, and denies increased appetite, nausea, paresthesia of the feet, polydipsia, polyuria, visual disturbances, vomiting and weight loss. Last A1C in the office was:  Lab Results  Component Value Date   HGBA1C 5.3 11/11/2018   Last GFR Lab Results  Component Value Date   GFRNONAA 82 11/11/2018    Patient is on Vitamin D supplement and at goal as last check:    Lab Results  Component Value Date   VD25OH 66 11/11/2018       Medication Review: Current Outpatient Medications on File Prior to Visit  Medication Sig Dispense Refill  . calcium carbonate (OS-CAL) 600 MG tablet Takes 1 tab / day    . Cholecalciferol (VITAMIN D) 2000 units CAPS Takes 1 cap 2 x / day    . ezetimibe (ZETIA) 10 MG tablet Take 1 tablet Daily for Cholesterol 90 tablet 3  . fluconazole (DIFLUCAN) 150 MG tablet 1 pill once a week for 3 weeks 3 tablet 0  . gemfibrozil (LOPID) 600 MG tablet Take 1 tablet 2 x /day with Meals for Triglycerides (Blood Fats) 180 tablet 1  . ketoconazole (NIZORAL) 2 % cream APPLY EXTERNALLY TO THE AFFECTED AREA TWICE DAILY 60 g 3  . latanoprost (XALATAN) 0.005 % ophthalmic solution Place 1 drop into both eyes at bedtime.     Marland Kitchen loratadine (CLARITIN) 10 MG tablet Take 10 mg by mouth daily.    . Magnesium 250 MG TABS Takes 1 tab / day  0  . Omega-3 Fatty Acids (FISH OIL) 1000 MG CAPS Take 1 capsule (1,000 mg total) by mouth 2 (two) times daily.  0  . triamcinolone cream (KENALOG) 0.5 % Apply 1 application topically 2 (two) times daily. 80 g 2  . TURMERIC PO Take by mouth daily.     No current facility-administered medications on file prior to visit.    Allergies: Allergies  Allergen Reactions  . 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  Screening Tests Immunization History  Administered Date(s) Administered  . Influenza Whole 11/23/2012  . Influenza, High Dose Seasonal PF 11/23/2013, 10/19/2014, 10/26/2015, 11/05/2016, 10/29/2017, 10/15/2018  . Pneumococcal Conjugate-13 01/12/2014  . Pneumococcal Polysaccharide-23 12/02/2008  . Td 09/26/2010  . Zoster 02/25/2008   Preventative care: Last colonoscopy: 2008, - no further follow up per Dr. Cathrine Muster CT head 2013 atrophy CT angio chest 2015 - emphysema   DECLINES ALL SCANS/SCREENING  Prior vaccinations: TD or Tdap: 2012  Influenza: 10/2018  Pneumococcal: 2010 Prevnar13: 2015 Shingles/Zostavax: 2010  Names of Other Physician/Practitioners you currently use: 1. [facility] here for primary care 2. Dr. Emily Filbert, eye doctor, last visit 01/07/2018, glaucoma, stable  3. Dr. Sharma Covert,  2019, q 6 months  Patient Care Team: Unk Pinto, MD as PCP - General (Internal Medicine)  Surgical: He  has a past surgical history that includes Pilonidal cyst / sinus excision (1959) and axillary (Right, 1986). Family His family history includes Cancer in his father; Diabetes in his mother; Hypertension in his father and mother. Social history  He reports that he quit smoking about 9 years ago. He has never used smokeless tobacco. He reports current alcohol use. He reports that he does not use drugs.  MEDICARE WELLNESS OBJECTIVES: Physical activity:   Cardiac risk factors:   Depression/mood screen:   Depression screen [facility] 2/9 11/14/2018  Decreased Interest 0  Down, Depressed, Hopeless 0  PHQ - 2 Score 0    ADLs:  In your present state of health,  do you have any difficulty performing the following activities: 11/14/2018  Hearing? N  Vision? N  Difficulty concentrating or making decisions? N  Walking or climbing stairs? N  Dressing or bathing? N  Doing errands, shopping? N  Some recent data might be hidden     Cognitive Testing  Alert? Yes  Normal Appearance?Yes  Oriented to person? Yes  Place? Yes   Time? Yes  Recall of three objects?  No - cannot recall 0/3 on immediate recall  Can perform simple calculations? Yes  Displays appropriate judgment?Yes  Can read the correct time from a watch face?Yes  EOL planning:     Objective:   There were no vitals filed for this visit. There is no height or weight on file to calculate BMI.  General appearance: alert, no distress, WD/WN, male HEENT: normocephalic, sclerae anicteric, ext aud canals bilaterally obstructed by soft wax, nares patent, no discharge or erythema, pharynx normal Oral cavity: MMM, no lesions Neck: supple, no lymphadenopathy, no thyromegaly, no masses Heart: RRR, normal S1, S2, no murmurs Lungs: CTA bilaterally, no wheezes, rhonchi, or rales Abdomen: +bs, soft, non tender, non distended, no masses, no hepatomegaly, no splenomegaly Musculoskeletal: nontender, no swelling, no obvious deformity Extremities: no edema, no cyanosis, no clubbing Pulses: 2+ symmetric, upper and lower extremities, normal cap refill Neurological: alert, oriented x 3, CN2-12 intact, strength normal upper extremities and lower extremities, sensation normal throughout, DTRs 2+ throughout, no cerebellar signs, gait normal Psychiatric: normal affect, behavior normal, pleasant, short term memory extremely poor- 0/3 word on immediate recall, insight appropriate  Medicare Attestation I have personally reviewed: The patient's medical and social history Their use of alcohol, tobacco or illicit drugs Their current medications and supplements The patient's functional ability including ADLs,fall  risks, home safety risks, cognitive, and hearing and visual impairment Diet and physical activities Evidence for depression or mood disorders  The patient's weight, height, BMI, and visual acuity have been recorded in the chart.  I have made referrals, counseling, and provided education to the patient based on review of the above and I have provided the patient with a written personalized care plan for preventive services.     Cameron Ribas, NP   04/19/2019

## 2019-04-21 ENCOUNTER — Ambulatory Visit: Payer: Self-pay | Admitting: Adult Health

## 2019-06-03 DIAGNOSIS — H401131 Primary open-angle glaucoma, bilateral, mild stage: Secondary | ICD-10-CM | POA: Diagnosis not present

## 2019-06-03 DIAGNOSIS — H2513 Age-related nuclear cataract, bilateral: Secondary | ICD-10-CM | POA: Diagnosis not present

## 2019-06-03 DIAGNOSIS — H524 Presbyopia: Secondary | ICD-10-CM | POA: Diagnosis not present

## 2019-06-03 DIAGNOSIS — H5203 Hypermetropia, bilateral: Secondary | ICD-10-CM | POA: Diagnosis not present

## 2019-06-27 NOTE — Progress Notes (Deleted)
Annual  Screening/Preventative Visit  & Comprehensive Evaluation & Examination     This very nice 79 y.o.male presents for a Screening /Preventative Visit & comprehensive evaluation and management of multiple medical co-morbidities.  Patient has been followed for HTN, HLD, T2_NIDDM  Prediabetes and Vitamin D Deficiency.     HTN predates since     . Patient's BP has been controlled at home.  Today's  . Patient denies any cardiac symptoms as chest pain, palpitations, shortness of breath, dizziness or ankle swelling.     Patient's hyperlipidemia is controlled with diet and medications. Patient denies myalgias or other medication SE's. 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 Last A1c was  Lab Results  Component Value Date   HGBA1C 5.3 11/11/2018        Finally, patient has history of Vitamin D Deficiency of    and last vitamin D was  Lab Results  Component Value Date   VD25OH 66 11/11/2018    Current Outpatient Medications on File Prior to Visit  Medication Sig  . calcium carbonate (OS-CAL) 600 MG tablet Takes 1 tab / day  . Cholecalciferol (VITAMIN D) 2000 units CAPS Takes 1 cap 2 x / day  . ezetimibe (ZETIA) 10 MG tablet Take 1 tablet Daily for Cholesterol  . fluconazole (DIFLUCAN) 150 MG tablet 1 pill once a week for 3 weeks  . gemfibrozil (LOPID) 600 MG tablet Take 1 tablet 2 x /day with Meals for Triglycerides (Blood Fats)  . ketoconazole (NIZORAL) 2 % cream APPLY EXTERNALLY TO THE AFFECTED AREA TWICE DAILY  . latanoprost (XALATAN) 0.005 % ophthalmic solution Place 1 drop into both eyes at bedtime.   Marland Kitchen loratadine (CLARITIN) 10 MG tablet Take 10 mg by mouth daily.  . Magnesium 250 MG TABS  Takes 1 tab / day  . Omega-3 Fatty Acids (FISH OIL) 1000 MG CAPS Take 1 capsule (1,000 mg total) by mouth 2 (two) times daily.  Marland Kitchen triamcinolone cream (KENALOG) 0.5 % Apply 1 application topically 2 (two) times daily.  . TURMERIC PO Take by mouth daily.   No current facility-administered medications on file prior to visit.   Allergies  Allergen Reactions  . Benadryl [Diphenhydramine Hcl] Other (See Comments)    Hallucinations, itching, shaking   Past Medical History:  Diagnosis Date  . Glaucoma   . OSA (obstructive sleep apnea)   . RLS (restless legs syndrome)   . Vitamin D deficiency    Health Maintenance  Topic Date Due  . COVID-19 Vaccine (1) Never done  . INFLUENZA VACCINE  09/05/2019  . TETANUS/TDAP  09/25/2020  . PNA vac Low Risk Adult  Completed   Immunization History  Administered Date(s) Administered  . Influenza Whole 11/23/2012  . Influenza, High Dose Seasonal PF 11/23/2013, 10/19/2014, 10/26/2015, 11/05/2016, 10/29/2017, 10/15/2018  . Pneumococcal Conjugate-13 01/12/2014  . Pneumococcal Polysaccharide-23 12/02/2008  . Td 09/26/2010  . Zoster 02/25/2008   Last Colon -   Past Surgical History:  Procedure Laterality Date  . axillary Right 1986   biopsy of axillary nodes  . PILONIDAL CYST / SINUS EXCISION  1959   Family History  Problem Relation Age of Onset  .  Hypertension Mother   . Diabetes Mother   . Hypertension Father   . Cancer Father    Social History   Socioeconomic History  . Marital status: Married    Spouse name: Not on file  . Number of children: Not on file  . Years of education: Not on file  . Highest education level: Not on file  Occupational History  . Not on file  Tobacco Use  . Smoking status: Former Smoker    Quit date: 05/10/2009    Years since quitting: 10.1  . Smokeless tobacco: Never Used  Substance and Sexual Activity  . Alcohol use: Yes    Alcohol/week: 0.0 standard drinks  . Drug use: No  . Sexual activity: Not on  file  Other Topics Concern  . Not on file  Social History Narrative  . Not on file   Social Determinants of Health   Financial Resource Strain:   . Difficulty of Paying Living Expenses:   Food Insecurity:   . Worried About Charity fundraiser in the Last Year:   . Arboriculturist in the Last Year:   Transportation Needs:   . Film/video editor (Medical):   Marland Kitchen Lack of Transportation (Non-Medical):   Physical Activity:   . Days of Exercise per Week:   . Minutes of Exercise per Session:   Stress:   . Feeling of Stress :   Social Connections:   . Frequency of Communication with Friends and Family:   . Frequency of Social Gatherings with Friends and Family:   . Attends Religious Services:   . Active Member of Clubs or Organizations:   . Attends Archivist Meetings:   Marland Kitchen Marital Status:   Intimate Partner Violence:   . Fear of Current or Ex-Partner:   . Emotionally Abused:   Marland Kitchen Physically Abused:   . Sexually Abused:     ROS Constitutional: Denies fever, chills, weight loss/gain, headaches, insomnia,  night sweats or change in appetite. Does c/o fatigue. Eyes: Denies redness, blurred vision, diplopia, discharge, itchy or watery eyes.  ENT: Denies discharge, congestion, post nasal drip, epistaxis, sore throat, earache, hearing loss, dental pain, Tinnitus, Vertigo, Sinus pain or snoring.  Cardio: Denies chest pain, palpitations, irregular heartbeat, syncope, dyspnea, diaphoresis, orthopnea, PND, claudication or edema Respiratory: denies cough, dyspnea, DOE, pleurisy, hoarseness, laryngitis or wheezing.  Gastrointestinal: Denies dysphagia, heartburn, reflux, water brash, pain, cramps, nausea, vomiting, bloating, diarrhea, constipation, hematemesis, melena, hematochezia, jaundice or hemorrhoids Genitourinary: Denies dysuria, frequency, urgency, nocturia, hesitancy, discharge, hematuria or flank pain Musculoskeletal: Denies arthralgia, myalgia, stiffness, Jt. Swelling, pain,  limp or strain/sprain. Denies Falls. Skin: Denies puritis, rash, hives, warts, acne, eczema or change in skin lesion Neuro: No weakness, tremor, incoordination, spasms, paresthesia or pain Psychiatric: Denies confusion, memory loss or sensory loss. Denies Depression. Endocrine: Denies change in weight, skin, hair change, nocturia, and paresthesia, diabetic polys, visual blurring or hyper / hypo glycemic episodes.  Heme/Lymph: No excessive bleeding, bruising or enlarged lymph nodes.  Physical Exam  There were no vitals taken for this visit.  General Appearance: Well nourished and well groomed and in no apparent distress.  Eyes: PERRLA, EOMs, conjunctiva no swelling or erythema, normal fundi and vessels. Sinuses: No frontal/maxillary tenderness ENT/Mouth: EACs patent / TMs  nl. Nares clear without erythema, swelling, mucoid exudates. Oral hygiene is good. No erythema, swelling, or exudate. Tongue normal, non-obstructing. Tonsils not swollen or erythematous. Hearing normal.  Neck: Supple, thyroid not palpable. No bruits, nodes or  JVD. Respiratory: Respiratory effort normal.  BS equal and clear bilateral without rales, rhonci, wheezing or stridor. Cardio: Heart sounds are normal with regular rate and rhythm and no murmurs, rubs or gallops. Peripheral pulses are normal and equal bilaterally without edema. No aortic or femoral bruits. Chest: symmetric with normal excursions and percussion.  Abdomen: Soft, with Nl bowel sounds. Nontender, no guarding, rebound, hernias, masses, or organomegaly.  Lymphatics: Non tender without lymphadenopathy.  Musculoskeletal: Full ROM all peripheral extremities, joint stability, 5/5 strength, and normal gait. Skin: Warm and dry without rashes, lesions, cyanosis, clubbing or  ecchymosis.  Neuro: Cranial nerves intact, reflexes equal bilaterally. Normal muscle tone, no cerebellar symptoms. Sensation intact.  Pysch: Alert and oriented X 3 with normal affect, insight  and judgment appropriate.   Assessment and Plan  1. Annual Preventative/Screening Exam            Patient was counseled in prudent diet, weight control to achieve/maintain BMI less than 25, BP monitoring, regular exercise and medications as discussed.  Discussed med effects and SE's. Routine screening labs and tests as requested with regular follow-up as recommended. Over 40 minutes of exam, counseling, chart review and high complex critical decision making was performed   Marinus Maw, MD

## 2019-06-28 ENCOUNTER — Ambulatory Visit: Payer: PPO | Admitting: Internal Medicine

## 2019-07-19 ENCOUNTER — Ambulatory Visit: Payer: PPO | Admitting: Physician Assistant

## 2019-07-19 DIAGNOSIS — B009 Herpesviral infection, unspecified: Secondary | ICD-10-CM | POA: Insufficient documentation

## 2019-07-19 NOTE — Progress Notes (Signed)
MEDICARE ANNUAL WELLNESS VISIT AND FOLLOW UP Assessment:   Encounter for Medicare annual wellness exam 1 year  Essential hypertension Continue medication Monitor blood pressure at home; call if consistently over 130/80 Continue DASH diet.   Reminder to go to the ER if any CP, SOB, nausea, dizziness, severe HA, changes vision/speech, left arm numbness and tingling and jaw pain.  Chronic obstructive pulmonary disease, unspecified COPD type (HCC) Emphysema on imaging; stable without inhalers. Continue to monitor  OSA (obstructive sleep apnea) Previously on CPAP; hasn't used in years. No observed apnea by wife,    SDAT (senile dementia of Alzheimer's type) Continue to monitor -  Balance is declining, discussed PT, declines No longer treated by namenda/aricept  BMI 23.0-23.9, adult  Glaucoma, unspecified glaucoma type, unspecified laterality Continue follow up with ophthalmology  Hyperlipidemia Continue medications Continue low cholesterol diet and exercise.   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  Plan:   During the course of the visit the patient was educated and counseled about appropriate  screening and preventive services including:    Pneumococcal vaccine   Influenza vaccine  Prevnar 13  Td vaccine  Screening electrocardiogram  Colorectal cancer screening  Diabetes screening  Glaucoma screening  Nutrition counseling    Subjective:  Cameron Mcclain is a 79 y.o. male who presents for Medicare Annual Wellness Visit and 3 month follow up for HTN, hyperlipidemia, glucose management, and vitamin D Def.   Patient has had a progressive Dementia for several years and wife has discontinued his Aricept & Namenda for concern of perceived side-effects and lack of perceived benefits.  Patient has a very short term recall, but does remain independent in personal ADL's w/ supervision and guidance by his wife.  They have home care worker to come in the house, Maralyn Sago will occ make sandwichs and cook, conrad will , he is not driving and sarah is not driving.  Head CT scan in 2013 showed atrophy.   He has OSA formerly on CPAP, but ongoing need for CPAP for OSA since weight loss has been questioned; wife has been monitoring without observed episodes of apnea.   BMI is Body mass index is 20.77 kg/m., he has not been working on diet and exercise. [name] states sarah the mom has not been feeling well so [name] has not been eating well to explain the weight loss.   Wt Readings from Last 3 Encounters:  07/21/19 136 lb 9.6 oz (62 kg)  02/04/19 152 lb (68.9 kg)  11/11/18 159 lb 12.8 oz (72.5 kg)   His blood pressure has been controlled at home, today their BP is BP: 116/68  He does not workout. He denies chest pain, shortness of breath, dizziness.   He is on cholesterol medication (gemfibrozil, zetia, omega 3) and denies myalgias. His cholesterol is not at goal. The cholesterol last visit was:   Lab Results  Component Value Date   CHOL 200 (H) 11/11/2018   HDL 54 11/11/2018   LDLCALC 121 (H) 11/11/2018   TRIG 130 11/11/2018   CHOLHDL 3.7 11/11/2018   He has not been working on diet  and exercise for glucose management, and denies increased appetite, nausea, paresthesia of the feet, polydipsia, polyuria, visual disturbances, vomiting and weight loss. Last A1C in the office was:  Lab Results  Component Value Date   HGBA1C 5.3 11/11/2018   Last GFR Lab Results  Component Value Date   GFRNONAA 82 11/11/2018    Patient is on Vitamin D supplement and at goal as last check:    Lab Results  Component Value Date   VD25OH 66 11/11/2018       Medication Review:   Current Outpatient Medications (Cardiovascular):  .  ezetimibe (ZETIA) 10 MG tablet, Take 1 tablet Daily for Cholesterol .  gemfibrozil (LOPID) 600 MG tablet, Take 1 tablet 2 x /day with Meals for Triglycerides (Blood Fats)  Current Outpatient Medications (Respiratory):  .  loratadine (CLARITIN) 10 MG tablet, Take 10 mg by mouth daily.    Current Outpatient Medications (Other):  .  calcium carbonate (OS-CAL) 600 MG tablet, Takes 1 tab / day .  Cholecalciferol (VITAMIN D) 2000 units CAPS, Takes 1 cap 2 x / day .  latanoprost (XALATAN) 0.005 % ophthalmic solution, Place 1 drop into both eyes at bedtime.  .  Magnesium 250 MG TABS, Takes 1 tab / day .  Omega-3 Fatty Acids (FISH OIL) 1000 MG CAPS, Take 1 capsule (1,000 mg total) by mouth 2 (two) times daily. .  TURMERIC PO, Take by mouth daily. Marland Kitchen  ketoconazole (NIZORAL) 2 % cream, APPLY EXTERNALLY TO THE AFFECTED AREA TWICE DAILY (Patient not taking: Reported on 07/21/2019) .  NEOMYCIN-POLYMYXIN-HYDROCORTISONE (CORTISPORIN) 1 % SOLN OTIC solution, Place 3 drops into the left ear in the morning and at bedtime for 5 days. Marland Kitchen  triamcinolone cream (KENALOG) 0.5 %, Apply 1 application topically 2 (two) times daily. (Patient not taking: Reported on 07/21/2019)  Allergies: Allergies  Allergen Reactions  . 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  Screening Tests Immunization History  Administered Date(s) Administered  . Influenza Whole 11/23/2012  . Influenza, High Dose Seasonal PF 11/23/2013, 10/19/2014, 10/26/2015, 11/05/2016, 10/29/2017, 10/15/2018  . Pneumococcal Conjugate-13 01/12/2014  . Pneumococcal Polysaccharide-23 12/02/2008  . Td 09/26/2010  . Zoster 02/25/2008   Health Maintenance  Topic Date Due  . Hepatitis C Screening  Never done  . COVID-19 Vaccine (1) Never done  . INFLUENZA VACCINE  09/05/2019  . TETANUS/TDAP  09/25/2020  . PNA vac Low Risk Adult  Completed    Preventative care: Last colonoscopy: 2008, - no further follow up per Dr. Cathrine Muster CT head 2013 atrophy CT angio chest 2015 - emphysema   DECLINES ALL SCANS/SCREENING   Names of Other Physician/Practitioners you currently  use: 1. [facility] here for primary care 2. Dr. Emily Filbert, eye doctor, last visit 2021, glaucoma, stable  3. Dr. Sharma Covert, 2019  Patient Care Team: Lucky Cowboy, MD as PCP - General (Internal Medicine)  Surgical: He  has a past surgical history that includes Pilonidal cyst / sinus excision (1959) and axillary (Right, 1986). Family His family history includes Cancer in his father; Diabetes in his mother; Hypertension in his father and mother. Social history  He reports that he quit smoking about 10 years ago. He has never used smokeless tobacco. He reports current alcohol use. He reports that he does not use drugs.  MEDICARE WELLNESS OBJECTIVES: Physical activity: Current Exercise  Habits: The patient does not participate in regular exercise at present Cardiac risk factors: Cardiac Risk Factors include: advanced age (>43men, >11 women);dyslipidemia;hypertension;male gender;sedentary lifestyle   Depression/mood screen:   Depression screen [facility] 2/9 07/21/2019  Decreased Interest 0  Down, Depressed, Hopeless 0  PHQ - 2 Score 0    ADLs:  In your present state of health, do you have any difficulty performing the following activities: 07/21/2019 11/14/2018  Hearing? N N  Vision? N N  Difficulty concentrating or making decisions? Y N  Walking or climbing stairs? N N  Comment slow and unstable- holds onto rail -  Dressing or bathing? N N  Doing errands, shopping? Y N  Preparing Food and eating ? Y -  Using the Toilet? N -  In the past six months, have you accidently leaked urine? N -  Do you have problems with loss of bowel control? N -  Managing your Medications? Y -  Managing your Finances? Y -  Housekeeping or managing your Housekeeping? Y -  Some recent data might be hidden     Cognitive Testing  Alert? Yes  Normal Appearance?Yes  Oriented to person? Yes  Place? Yes   Time? No- does not know  Recall of three objects?  No - cannot recall 0/3 on immediate recall   EOL planning: Does Patient Have a Medical Advance Directive?: Yes Type of Advance Directive: Healthcare Power of Attorney, Living will Copy of Healthcare Power of Attorney in Chart?: No - copy requested Yes  Objective:   Today's Vitals   07/21/19 1359  BP: 116/68  Pulse: 66  Temp: 97.9 F (36.6 C)  SpO2: 95%  Weight: 136 lb 9.6 oz (62 kg)  Height: 5\' 8"  (1.727 m)  PainSc: 0-No pain   Body mass index is 20.77 kg/m.  General appearance: alert, no distress, WD/WN, male HEENT: normocephalic, sclerae anicteric, ext aud canals bilaterally obstructed by soft wax, nares patent, no discharge or erythema, pharynx normal Oral cavity: MMM, no lesions Neck: supple, no lymphadenopathy, no  thyromegaly, no masses Heart: RRR, normal S1, S2, no murmurs Lungs: CTA bilaterally, no wheezes, rhonchi, or rales Abdomen: +bs, soft, non tender, non distended, no masses, no hepatomegaly, no splenomegaly Musculoskeletal: nontender, no swelling, no obvious deformity Extremities: 1-2 edema, no cyanosis, no clubbing Penis: no rash today, no ulcers, no discharge.  Pulses: 2+ symmetric, upper and lower extremities, normal cap refill Neurological: alert, oriented x 3, CN2-12 intact, strength normal upper extremities and lower extremities, sensation normal throughout, DTRs 2+ throughout, no cerebellar signs, gait normal Psychiatric: normal affect, behavior normal, pleasant, short term memory extremely poor- 0/3 word on immediate recall, insight appropriate  Medicare Attestation I have personally reviewed: The patient's medical and social history Their use of alcohol, tobacco or illicit drugs Their current  medications and supplements The patient's functional ability including ADLs,fall risks, home safety risks, cognitive, and hearing and visual impairment Diet and physical activities Evidence for depression or mood disorders  The patient's weight, height, BMI, and visual acuity have been recorded in the chart.  I have made referrals, counseling, and provided education to the patient based on review of the above and I have provided the patient with a written personalized care plan for preventive services.     Vicie Mutters, PA-C   07/21/2019

## 2019-07-21 ENCOUNTER — Ambulatory Visit (INDEPENDENT_AMBULATORY_CARE_PROVIDER_SITE_OTHER): Payer: PPO | Admitting: Physician Assistant

## 2019-07-21 ENCOUNTER — Other Ambulatory Visit: Payer: Self-pay

## 2019-07-21 ENCOUNTER — Encounter: Payer: Self-pay | Admitting: Physician Assistant

## 2019-07-21 VITALS — BP 116/68 | HR 66 | Temp 97.9°F | Ht 68.0 in | Wt 136.6 lb

## 2019-07-21 DIAGNOSIS — J449 Chronic obstructive pulmonary disease, unspecified: Secondary | ICD-10-CM | POA: Diagnosis not present

## 2019-07-21 DIAGNOSIS — Z0001 Encounter for general adult medical examination with abnormal findings: Secondary | ICD-10-CM

## 2019-07-21 DIAGNOSIS — Z6823 Body mass index (BMI) 23.0-23.9, adult: Secondary | ICD-10-CM

## 2019-07-21 DIAGNOSIS — F0390 Unspecified dementia without behavioral disturbance: Secondary | ICD-10-CM

## 2019-07-21 DIAGNOSIS — G4733 Obstructive sleep apnea (adult) (pediatric): Secondary | ICD-10-CM

## 2019-07-21 DIAGNOSIS — I10 Essential (primary) hypertension: Secondary | ICD-10-CM

## 2019-07-21 DIAGNOSIS — E782 Mixed hyperlipidemia: Secondary | ICD-10-CM | POA: Diagnosis not present

## 2019-07-21 DIAGNOSIS — H409 Unspecified glaucoma: Secondary | ICD-10-CM | POA: Diagnosis not present

## 2019-07-21 DIAGNOSIS — Z87891 Personal history of nicotine dependence: Secondary | ICD-10-CM | POA: Diagnosis not present

## 2019-07-21 DIAGNOSIS — Z0000 Encounter for general adult medical examination without abnormal findings: Secondary | ICD-10-CM

## 2019-07-21 DIAGNOSIS — R6889 Other general symptoms and signs: Secondary | ICD-10-CM

## 2019-07-21 DIAGNOSIS — R7309 Other abnormal glucose: Secondary | ICD-10-CM

## 2019-07-21 DIAGNOSIS — E559 Vitamin D deficiency, unspecified: Secondary | ICD-10-CM | POA: Diagnosis not present

## 2019-07-21 DIAGNOSIS — H9202 Otalgia, left ear: Secondary | ICD-10-CM

## 2019-07-21 DIAGNOSIS — H6122 Impacted cerumen, left ear: Secondary | ICD-10-CM

## 2019-07-21 DIAGNOSIS — R634 Abnormal weight loss: Secondary | ICD-10-CM

## 2019-07-21 DIAGNOSIS — I700 Atherosclerosis of aorta: Secondary | ICD-10-CM

## 2019-07-21 DIAGNOSIS — Z79899 Other long term (current) drug therapy: Secondary | ICD-10-CM | POA: Diagnosis not present

## 2019-07-21 DIAGNOSIS — B009 Herpesviral infection, unspecified: Secondary | ICD-10-CM

## 2019-07-21 DIAGNOSIS — F03B0 Unspecified dementia, moderate, without behavioral disturbance, psychotic disturbance, mood disturbance, and anxiety: Secondary | ICD-10-CM

## 2019-07-21 MED ORDER — NEOMYCIN-POLYMYXIN-HC 1 % OT SOLN: 3.0000 [drp] | Freq: Two times a day (BID) | OTIC | 0 refills | Status: AC

## 2019-07-21 NOTE — Patient Instructions (Signed)
WEIGHT GAIN  Try to make sure you are eating plenty of high calorie dense foods like: Avocado Nuts Peanut butter Oatmeal Dates Cottage cheese Greek yogurt Protein powder  Add Ensure/Boost

## 2019-07-22 LAB — COMPLETE METABOLIC PANEL WITH GFR
AG Ratio: 1.8 (calc) (ref 1.0–2.5)
ALT: 8 U/L — ABNORMAL LOW (ref 9–46)
AST: 15 U/L (ref 10–35)
Albumin: 4.2 g/dL (ref 3.6–5.1)
Alkaline phosphatase (APISO): 44 U/L (ref 35–144)
BUN: 17 mg/dL (ref 7–25)
CO2: 28 mmol/L (ref 20–32)
Calcium: 9.3 mg/dL (ref 8.6–10.3)
Chloride: 99 mmol/L (ref 98–110)
Creat: 0.8 mg/dL (ref 0.70–1.18)
GFR, Est African American: 99 mL/min/{1.73_m2} (ref >=60)
GFR, Est Non African American: 86 mL/min/{1.73_m2} (ref >=60)
Globulin: 2.3 g/dL (calc) (ref 1.9–3.7)
Glucose, Bld: 82 mg/dL (ref 65–99)
Potassium: 4.7 mmol/L (ref 3.5–5.3)
Sodium: 136 mmol/L (ref 135–146)
Total Bilirubin: 0.6 mg/dL (ref 0.2–1.2)
Total Protein: 6.5 g/dL (ref 6.1–8.1)

## 2019-07-22 LAB — LIPID PANEL
Cholesterol: 202 mg/dL — ABNORMAL HIGH (ref <200)
HDL: 48 mg/dL (ref >=40)
LDL Cholesterol (Calc): 133 mg/dL (calc) — ABNORMAL HIGH
Non-HDL Cholesterol (Calc): 154 mg/dL (calc) — ABNORMAL HIGH (ref <130)
Total CHOL/HDL Ratio: 4.2 (calc) (ref <5.0)
Triglycerides: 105 mg/dL (ref <150)

## 2019-07-22 LAB — CBC WITH DIFFERENTIAL/PLATELET
Absolute Monocytes: 581 cells/uL (ref 200–950)
Basophils Absolute: 88 cells/uL (ref 0–200)
Basophils Relative: 1 %
Eosinophils Absolute: 299 cells/uL (ref 15–500)
Eosinophils Relative: 3.4 %
HCT: 42.9 % (ref 38.5–50.0)
Hemoglobin: 14.5 g/dL (ref 13.2–17.1)
Lymphs Abs: 2860 cells/uL (ref 850–3900)
MCH: 32.9 pg (ref 27.0–33.0)
MCHC: 33.8 g/dL (ref 32.0–36.0)
MCV: 97.3 fL (ref 80.0–100.0)
MPV: 11.4 fL (ref 7.5–12.5)
Monocytes Relative: 6.6 %
Neutro Abs: 4972 cells/uL (ref 1500–7800)
Neutrophils Relative %: 56.5 %
Platelets: 231 10*3/uL (ref 140–400)
RBC: 4.41 10*6/uL (ref 4.20–5.80)
RDW: 11.6 % (ref 11.0–15.0)
Total Lymphocyte: 32.5 %
WBC: 8.8 10*3/uL (ref 3.8–10.8)

## 2019-07-22 LAB — TSH: TSH: 0.74 mIU/L (ref 0.40–4.50)

## 2019-07-22 LAB — HEMOGLOBIN A1C
Hgb A1c MFr Bld: 5.2 % of total Hgb (ref <5.7)
Mean Plasma Glucose: 103 (calc)
eAG (mmol/L): 5.7 (calc)

## 2019-09-13 ENCOUNTER — Telehealth: Payer: Self-pay

## 2019-09-13 MED ORDER — TRIAMCINOLONE ACETONIDE 0.5 % EX CREA: 1.0000 "application " | TOPICAL_CREAM | Freq: Two times a day (BID) | CUTANEOUS | 2 refills | Status: DC

## 2019-09-13 MED ORDER — KETOCONAZOLE 2 % EX CREA: TOPICAL_CREAM | CUTANEOUS | 3 refills | Status: DC

## 2019-09-13 NOTE — Telephone Encounter (Signed)
-----   Message from Quentin Mulling, [place] sent at 09/13/2019  1:00 PM EDT ----- Regarding: RE: front office call Contact: [phone] Is it actually on his thigh or on his penis?  I recently treated a rash on his penis that did go away with medication. I have not treated a rash on his thigh so may need to see it before treating it.  Thanks. Marchelle Folks ----- Message ----- From: Gregery Na, CMA Sent: 09/13/2019  11:29 AM EDT To: Quentin Mulling, PA-C Subject: front office call                              Patient reports that his rash has come back on his THIGH.  [place]'s: FRIENDLY

## 2019-09-13 NOTE — Telephone Encounter (Signed)
Patient's son/ caregiver was made aware of meds that were sent into pharmacy. Patient 's caregiver voiced & agreed to pick up Rx.

## 2019-09-13 NOTE — Addendum Note (Signed)
Addended by: Quentin Mulling R on: 09/13/2019 02:02 PM   Modules accepted: Orders

## 2019-09-13 NOTE — Telephone Encounter (Signed)
Yes thigh to upper groin area per patient's son same spot it was last time

## 2019-10-20 NOTE — Progress Notes (Addendum)
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   B12 deficiency -     Vitamin B12  Essential hypertension Continue medication Monitor blood pressure at home; call if consistently over 130/80 Continue DASH diet.   Reminder to go to the ER if any CP, SOB, nausea, dizziness, severe HA, changes vision/speech, left arm numbness and tingling and jaw pain.  Chronic obstructive pulmonary disease, unspecified COPD type (HCC) Emphysema on imaging; stable without inhalers. Continue to monitor  SDAT (senile dementia of Alzheimer's type) Continue to monitor -  Balance is declining, he is not eating as much, patient is losing weight, will evaluate with hospice No longer treated by namenda/aricept  Hyperlipidemia Continue medications Continue low cholesterol diet and exercise.   -     Lipid panel -     TSH  Medication management -     CBC with Differential/Platelet -     CMP/GFR  Weight loss Declines formal work up Likely due to dementia that is getting worse, will evaluate for hospice Add ensure   Over 30 minutes of exam, counseling, chart review, and critical decision making was performed  No future appointments.  Subjective:  LOUIS IVERY is a 79 y.o. male who presents for 3 month follow up for HTN, hyperlipidemia, glucose management, and vitamin D Def.   Patient has had a progressive Dementia for several years and wife has discontinued his Aricept & Namenda for concern of perceived side-effects and lack of perceived benefits.  Patient has a very short term recall, but does remains independent in personal ADL's w/ supervision/instruction- mainly just one task at a time- can not do multiple tasks.  They have home care worker to come in the house, Maralyn Sago will occ make sandwichs and cook, conrad will , he is not driving and sarah  is not driving.  Head CT scan in 2013 showed atrophy.   He has OSA formerly on CPAP, but ongoing need for CPAP for OSA since weight loss has been questioned; wife has been monitoring without observed episodes of apnea.   BMI is Body mass index is 20.53 kg/m., he has not been working on diet and exercise. Patient states he is not hungry, trying to eat more- weight is steady. Wt Readings from Last 3 Encounters:  10/21/19 135 lb (61.2 kg)  07/21/19 136 lb 9.6 oz (62 kg)  02/04/19 152 lb (68.9 kg)   His blood pressure has been controlled at home, today their BP is BP: 136/72 He does not workout. He denies chest pain, shortness of breath, dizziness.   He is on cholesterol medication and denies myalgias. His cholesterol is not at goal. The cholesterol last visit was:   Lab Results  Component Value Date   CHOL 202 (H) 07/21/2019   HDL 48 07/21/2019   LDLCALC 133 (H) 07/21/2019   TRIG 105 07/21/2019   CHOLHDL 4.2 07/21/2019   He has not been working on diet and exercise for glucose management, and denies increased appetite, nausea, paresthesia of the feet, polydipsia, polyuria, visual disturbances, vomiting and weight loss. Last A1C in the office was:  Lab Results  Component Value Date   HGBA1C 5.2 07/21/2019   Last GFR Lab Results  Component Value Date   GFRNONAA 86 07/21/2019    Patient is on Vitamin D supplement and at goal as last check:    Lab Results  Component Value Date   VD25OH 66 11/11/2018      Medication Review:   Current Outpatient Medications (Cardiovascular):  .  ezetimibe (ZETIA) 10 MG tablet, Take 1 tablet Daily for Cholesterol .  gemfibrozil (LOPID) 600 MG tablet, Take 1 tablet 2 x /day with Meals for Triglycerides (Blood Fats)  Current Outpatient Medications (Respiratory):  .  loratadine (CLARITIN) 10 MG tablet, Take 10 mg by mouth daily.    Current Outpatient Medications (Other):  .  calcium carbonate (OS-CAL) 600 MG tablet, Takes 1 tab / day .   Cholecalciferol (VITAMIN D) 2000 units CAPS, Takes 1 cap 2 x / day .  ketoconazole (NIZORAL) 2 % cream, APPLY EXTERNALLY TO THE AFFECTED AREA TWICE DAILY .  latanoprost (XALATAN) 0.005 % ophthalmic solution, Place 1 drop into both eyes at bedtime.  .  Magnesium 250 MG TABS, Takes 1 tab / day .  Omega-3 Fatty Acids (FISH OIL) 1000 MG CAPS, Take 1 capsule (1,000 mg total) by mouth 2 (two) times daily. Marland Kitchen  triamcinolone cream (KENALOG) 0.5 %, Apply 1 application topically 2 (two) times daily. .  TURMERIC PO, Take by mouth daily.  Allergies: Allergies  Allergen Reactions  . 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  Surgical: He  has a past surgical history that includes Pilonidal cyst / sinus excision (1959) and axillary (Right, 1986). Family His family history includes Cancer in his father; Diabetes in his mother; Hypertension in his father and mother. Social history  He reports that he quit smoking about 10 years ago. He has never used smokeless tobacco. He reports current alcohol use. He reports that he does not use drugs.   Objective:   Today's Vitals   10/21/19 1128  BP: 136/72  Pulse: 73  Temp: 97.7 F (36.5 C)  SpO2: 96%  Weight: 135 lb (61.2 kg)   Body mass index is 20.53 kg/m.  General appearance: alert, no distress, WD/WN, male HEENT: normocephalic, sclerae anicteric, ext aud canals bilaterally normal, nares patent, no discharge or erythema, pharynx normal Oral  cavity: MMM, no lesions Neck: supple, no lymphadenopathy, no thyromegaly, no masses Heart: RRR, normal S1, S2, no murmurs Lungs: CTA bilaterally, no wheezes, rhonchi, or rales Abdomen: +bs, soft, non tender, non distended, no masses, no hepatomegaly, no splenomegaly Musculoskeletal: nontender, no swelling, no obvious deformity Extremities: 1-2 edema, no cyanosis, no clubbing Penis: no rash today, no ulcers, no discharge.  Pulses: 2+ symmetric, upper and lower extremities, normal cap refill Neurological: alert, oriented x 3, CN2-12 intact, strength normal upper extremities and lower extremities,  throughout, DTRs 2+ throughout, no cerebellar signs, gait steady Psychiatric: normal affect, behavior normal, pleasant, short term memory extremely poor- 0/3 word on immediate recall, insight poor   Quentin Mulling, PA-C   10/21/2019

## 2019-10-21 ENCOUNTER — Other Ambulatory Visit: Payer: Self-pay

## 2019-10-21 ENCOUNTER — Encounter: Payer: Self-pay | Admitting: Physician Assistant

## 2019-10-21 ENCOUNTER — Ambulatory Visit (INDEPENDENT_AMBULATORY_CARE_PROVIDER_SITE_OTHER): Payer: PPO | Admitting: Physician Assistant

## 2019-10-21 VITALS — BP 136/72 | HR 73 | Temp 97.7°F | Wt 135.0 lb

## 2019-10-21 DIAGNOSIS — E538 Deficiency of other specified B group vitamins: Secondary | ICD-10-CM | POA: Diagnosis not present

## 2019-10-21 DIAGNOSIS — Z79899 Other long term (current) drug therapy: Secondary | ICD-10-CM

## 2019-10-21 DIAGNOSIS — G4733 Obstructive sleep apnea (adult) (pediatric): Secondary | ICD-10-CM

## 2019-10-21 DIAGNOSIS — I10 Essential (primary) hypertension: Secondary | ICD-10-CM

## 2019-10-21 DIAGNOSIS — E782 Mixed hyperlipidemia: Secondary | ICD-10-CM

## 2019-10-21 DIAGNOSIS — F0390 Unspecified dementia without behavioral disturbance: Secondary | ICD-10-CM | POA: Diagnosis not present

## 2019-10-21 DIAGNOSIS — J449 Chronic obstructive pulmonary disease, unspecified: Secondary | ICD-10-CM

## 2019-10-21 DIAGNOSIS — R634 Abnormal weight loss: Secondary | ICD-10-CM

## 2019-10-21 DIAGNOSIS — F03B0 Unspecified dementia, moderate, without behavioral disturbance, psychotic disturbance, mood disturbance, and anxiety: Secondary | ICD-10-CM

## 2019-10-21 NOTE — Patient Instructions (Addendum)
Ketoconazole is the actual treatment for infection  Triamcinolone is for redness/swelling  Can use together or separate     Booster won't be for 8 months or can see in Nov/Dec if they are needed

## 2019-10-22 LAB — CBC WITH DIFFERENTIAL/PLATELET
Absolute Monocytes: 656 cells/uL (ref 200–950)
Basophils Absolute: 79 cells/uL (ref 0–200)
Basophils Relative: 1 %
Eosinophils Absolute: 332 cells/uL (ref 15–500)
Eosinophils Relative: 4.2 %
HCT: 39.5 % (ref 38.5–50.0)
Hemoglobin: 13.4 g/dL (ref 13.2–17.1)
Lymphs Abs: 3018 cells/uL (ref 850–3900)
MCH: 32.8 pg (ref 27.0–33.0)
MCHC: 33.9 g/dL (ref 32.0–36.0)
MCV: 96.6 fL (ref 80.0–100.0)
MPV: 10.6 fL (ref 7.5–12.5)
Monocytes Relative: 8.3 %
Neutro Abs: 3816 cells/uL (ref 1500–7800)
Neutrophils Relative %: 48.3 %
Platelets: 229 10*3/uL (ref 140–400)
RBC: 4.09 10*6/uL — ABNORMAL LOW (ref 4.20–5.80)
RDW: 11.4 % (ref 11.0–15.0)
Total Lymphocyte: 38.2 %
WBC: 7.9 10*3/uL (ref 3.8–10.8)

## 2019-10-22 LAB — TSH: TSH: 0.66 mIU/L (ref 0.40–4.50)

## 2019-10-22 LAB — COMPLETE METABOLIC PANEL WITH GFR
AG Ratio: 2.1 (calc) (ref 1.0–2.5)
ALT: 7 U/L — ABNORMAL LOW (ref 9–46)
AST: 13 U/L (ref 10–35)
Albumin: 4 g/dL (ref 3.6–5.1)
Alkaline phosphatase (APISO): 55 U/L (ref 35–144)
BUN: 13 mg/dL (ref 7–25)
CO2: 31 mmol/L (ref 20–32)
Calcium: 9.1 mg/dL (ref 8.6–10.3)
Chloride: 97 mmol/L — ABNORMAL LOW (ref 98–110)
Creat: 0.76 mg/dL (ref 0.70–1.18)
GFR, Est African American: 101 mL/min/{1.73_m2} (ref >=60)
GFR, Est Non African American: 87 mL/min/{1.73_m2} (ref >=60)
Globulin: 1.9 g/dL (calc) (ref 1.9–3.7)
Glucose, Bld: 70 mg/dL (ref 65–99)
Potassium: 4.3 mmol/L (ref 3.5–5.3)
Sodium: 133 mmol/L — ABNORMAL LOW (ref 135–146)
Total Bilirubin: 0.6 mg/dL (ref 0.2–1.2)
Total Protein: 5.9 g/dL — ABNORMAL LOW (ref 6.1–8.1)

## 2019-10-22 LAB — LIPID PANEL
Cholesterol: 198 mg/dL (ref <200)
HDL: 54 mg/dL (ref >=40)
LDL Cholesterol (Calc): 125 mg/dL (calc) — ABNORMAL HIGH
Non-HDL Cholesterol (Calc): 144 mg/dL (calc) — ABNORMAL HIGH (ref <130)
Total CHOL/HDL Ratio: 3.7 (calc) (ref <5.0)
Triglycerides: 90 mg/dL (ref <150)

## 2019-10-22 LAB — VITAMIN B12: Vitamin B-12: 313 pg/mL (ref 200–1100)

## 2019-10-22 NOTE — Addendum Note (Signed)
Addended by: Quentin Mulling R on: 10/22/2019 01:36 PM   Modules accepted: Orders

## 2019-11-26 ENCOUNTER — Other Ambulatory Visit: Payer: Self-pay | Admitting: Adult Health

## 2019-11-26 DIAGNOSIS — R410 Disorientation, unspecified: Secondary | ICD-10-CM

## 2019-11-27 ENCOUNTER — Other Ambulatory Visit: Payer: Self-pay | Admitting: Adult Health

## 2019-11-27 LAB — URINALYSIS W MICROSCOPIC + REFLEX CULTURE
Bacteria, UA: NONE SEEN /HPF
Bilirubin Urine: NEGATIVE
Glucose, UA: NEGATIVE
Hgb urine dipstick: NEGATIVE
Hyaline Cast: NONE SEEN /LPF
Ketones, ur: NEGATIVE
Leukocyte Esterase: NEGATIVE
Nitrites, Initial: NEGATIVE
Protein, ur: NEGATIVE
RBC / HPF: NONE SEEN /HPF (ref 0–2)
Specific Gravity, Urine: 1.017 (ref 1.001–1.03)
Squamous Epithelial / HPF: NONE SEEN /HPF (ref <=5)
WBC, UA: NONE SEEN /HPF (ref 0–5)
pH: 6 (ref 5.0–8.0)

## 2019-11-27 LAB — NO CULTURE INDICATED

## 2019-11-27 MED ORDER — QUETIAPINE FUMARATE 25 MG PO TABS: 25.0000 mg | ORAL_TABLET | Freq: Every day | ORAL | 0 refills | Status: DC

## 2019-11-27 NOTE — Progress Notes (Signed)
Patient's son [name] returned phone call - communicated negative urinalysis results. He reports patient is not significantly agitated during the day, but becomes increasingly confused in the evening and occasionally agitated in recent few days. This is atypical and appears to be a progression of dementia. He denies any specific concerning sx; he inquires regarding medication for agitation that has been discussed in the past; discussed he may benefit from low dose Seroquel. Patient's son is in agreement to try this after discussion of risks and benefits. We will keep Monday AM appointment to check further labs and evaluate benefit for Seroquel.

## 2019-11-29 ENCOUNTER — Other Ambulatory Visit: Payer: Self-pay

## 2019-11-29 ENCOUNTER — Encounter: Payer: Self-pay | Admitting: Adult Health

## 2019-11-29 ENCOUNTER — Ambulatory Visit (INDEPENDENT_AMBULATORY_CARE_PROVIDER_SITE_OTHER): Payer: PPO | Admitting: Adult Health

## 2019-11-29 VITALS — BP 120/64 | HR 88 | Temp 97.3°F | Wt 137.0 lb

## 2019-11-29 DIAGNOSIS — Z23 Encounter for immunization: Secondary | ICD-10-CM | POA: Diagnosis not present

## 2019-11-29 DIAGNOSIS — F0391 Unspecified dementia with behavioral disturbance: Secondary | ICD-10-CM | POA: Diagnosis not present

## 2019-11-29 DIAGNOSIS — R410 Disorientation, unspecified: Secondary | ICD-10-CM

## 2019-11-29 DIAGNOSIS — F03B18 Unspecified dementia, moderate, with other behavioral disturbance: Secondary | ICD-10-CM

## 2019-11-29 MED ORDER — QUETIAPINE FUMARATE 25 MG PO TABS: 25.0000 mg | ORAL_TABLET | Freq: Every day | ORAL | 0 refills | Status: DC

## 2019-11-29 NOTE — Progress Notes (Signed)
Assessment and Plan:  Cameron Mcclain was seen today for aggressive behavior.  Diagnoses and all orders for this visit:  Moderate dementia with behavioral disturbance (HCC) Gradual change over recent months; Sundowning; did have recent normal UA to r/o UTI; HPI and exam suggestive of progression of dementia; cost limiting sitter, memory care admission Declining neuro evaluation Son feels he and mom are able to manage patient fairly if intermittent PM agitation were improved Otherwise remarkably well and can complete most ADLs with verbal reminders and minimal assistance Good appetite, working on fluid intake Check labs to r/o other acute changes, organ functions, follow up on last mild hyponatremia Goal is for quality of life and staying in home as long as possible  No safety concerns other than with current intermittent agitation Discussed and will try low dose seroquel; have discussed risk of increased mortality in elders; son expresses desire to proceed with low dose trial  Will do close follow up in 2-4 weeks or call sooner if needed -     QUEtiapine (SEROQUEL) 25 MG tablet; Take 1 tablet (25 mg total) by mouth at bedtime. -     CBC with Differential/Platelet -     COMPLETE METABOLIC PANEL WITH GFR -     TSH  Need for immunization against influenza -     Flu vaccine HIGH DOSE PF  Further disposition pending results of labs. Discussed med's effects and SE's.   Over 30 minutes of exam, counseling, chart review, and critical decision making was performed.   Future Appointments  Date Time Provider Department Center  12/20/2019  9:30 AM Cameron Gaudier, NP [facility] None  02/23/2020 10:30 AM Cameron Mcclain, Cameron Kennedy, NP [facility] None    ------------------------------------------------------------------------------------------------------------------   HPI BP 120/64   Pulse 88   Temp (!) 97.3 F (36.3 C)   Wt 137 lb (62.1 kg)   SpO2 96%   BMI 20.83 kg/m   79 y.o.male with htn,  hyperlipidemia, former smoker, COPD, moderate dementia presents for evaluation accompanied by [name] due to reports of increased agitation. No recent new medications. No current high risk meds.   Reports over the last 1-2 months the patient has had increased episodes of confusion and agitation in the evenings; typically wakes up 1-2 times at night, not sure why, typically will get up and use the restroom, usually will go back to sleep but increasingly he is confused, not sure where he is or who family is and in the last week has had a few days where he becomes very agitated and thrown things at his wife (primary caregiver). Son also checks in twice daily and is 5 min away if needed in the evening. He denies any statements by patient suggestive of visual or auditory hallucinations.   Called Friday with concern due to episode the night previous, was unable to be seen at that time, denied any specific sx other than increased (atypical) agitation in the evenings, did have son bring back urine specimen for UA/culture which was negative. Denied generalized confusion/acute changes, had previously discussed seroquel which they wanted to start after discussion of risks and was sent in but wasn't able to pick up and hasn't started.   Patient has had progressive Dementia for several years and wife has discontinued his Aricept & Namenda for concern of perceived side-effects and lack of perceived benefits.  Patient has a very short term recall, but does remain independent in personal ADL's w/ supervision/instruction- mainly just one task at a time- can not do  multiple tasks. They have home care worker to come in the house, Cameron Mcclain will occ make sandwichs and cook, he is not driving and sarah is not driving. Cameron Mcclain, son, assists with driving and any other needs. He has quick call system so if mom needs help he can be there in 5 min. Discussed palliative/hospice but doesn't qualify. Can't afford overnight sitter.  [name] doesn't want stranger in home and managing fairly if can improve agitation. Likely needs memory care   Head CT scan in 2013 showed atrophy. Have declined referral to neurology for evaluation.   BMI is Body mass index is 20.83 kg/m., he has been working on diet and exercise. Wt Readings from Last 3 Encounters:  11/29/19 137 lb (62.1 kg)  10/21/19 135 lb (61.2 kg)  07/21/19 136 lb 9.6 oz (62 kg)      Past Medical History:  Diagnosis Date  . Glaucoma   . OSA (obstructive sleep apnea)   . RLS (restless legs syndrome)   . Vitamin D deficiency      Allergies  Allergen Reactions  . Benadryl [Diphenhydramine Hcl] Other (See Comments)    Hallucinations, itching, shaking    Current Outpatient Medications on File Prior to Visit  Medication Sig  . calcium carbonate (OS-CAL) 600 MG tablet Takes 1 tab / day  . Cholecalciferol (VITAMIN D) 2000 units CAPS Takes 1 cap 2 x / day  . ketoconazole (NIZORAL) 2 % cream APPLY EXTERNALLY TO THE AFFECTED AREA TWICE DAILY  . latanoprost (XALATAN) 0.005 % ophthalmic solution Place 1 drop into both eyes at bedtime.   Marland Kitchen loratadine (CLARITIN) 10 MG tablet Take 10 mg by mouth daily.  . Magnesium 250 MG TABS Takes 1 tab / day  . Omega-3 Fatty Acids (FISH OIL) 1000 MG CAPS Take 1 capsule (1,000 mg total) by mouth 2 (two) times daily.  Marland Kitchen ezetimibe (ZETIA) 10 MG tablet Take 1 tablet Daily for Cholesterol (Patient not taking: Reported on 11/29/2019)  . gemfibrozil (LOPID) 600 MG tablet Take 1 tablet 2 x /day with Meals for Triglycerides (Blood Fats) (Patient not taking: Reported on 11/29/2019)  . triamcinolone cream (KENALOG) 0.5 % Apply 1 application topically 2 (two) times daily. (Patient not taking: Reported on 11/29/2019)  . TURMERIC PO Take by mouth daily. (Patient not taking: Reported on 11/29/2019)   No current facility-administered medications on file prior to visit.    ROS: all negative except above.   Physical Exam:  BP 120/64   Pulse 88    Temp (!) 97.3 F (36.3 C)   Wt 137 lb (62.1 kg)   SpO2 96%   BMI 20.83 kg/m   General appearance: alert, no distress, WD/WN, male HEENT: normocephalic, sclerae anicteric, ext aud canals bilaterally normal, nares patent, no discharge or erythema, pharynx normal Oral cavity: MMM, no lesions Neck: supple, no lymphadenopathy, no thyromegaly, no masses Heart: RRR, normal S1, S2, no murmurs Lungs: CTA bilaterally, no wheezes, rhonchi, or rales Abdomen: +bs, soft, non tender, non distended, no masses, no hepatomegaly, no splenomegaly Musculoskeletal: nontender, no swelling, no obvious deformity Extremities: 1-2 edema, no cyanosis, no clubbing Penis: no rash today, no ulcers, no discharge.  Pulses: 2+ symmetric, upper and lower extremities, normal cap refill Neurological: alert, oriented x 3, CN2-12 intact, strength normal upper extremities and lower extremities,  throughout, DTRs 2+ throughout, no cerebellar signs, gait steady Psychiatric: normal affect, behavior normal, pleasant, short term memory extremely poor- 0/3 word on immediate recall, insight poor, becomes easily frustrated/mildly agitated with attempts  at MMSE   Dan Maker, NP 1:11 PM [facility]

## 2019-11-29 NOTE — Patient Instructions (Addendum)
Dementia Caregiver Guide Dementia is a term used to describe a number of symptoms that affect memory and thinking. The most common symptoms include:  Memory loss.  Trouble with language and communication.  Trouble concentrating.  Poor judgment.  Problems with reasoning.  Child-like behavior and language.  Extreme anxiety.  Angry outbursts.  Wandering from home or public places. Dementia usually gets worse slowly over time. In the early stages, people with dementia can stay independent and safe with some help. In later stages, they need help with daily tasks such as dressing, grooming, and using the bathroom. How to help the person with dementia cope Dementia can be frightening and confusing. Here are some tips to help the person with dementia cope with changes caused by the disease. General tips  Keep the person on track with his or her routine.  Try to identify areas where the person may need help.  Be supportive, patient, calm, and encouraging.  Gently remind the person that adjusting to changes takes time.  Help with the tasks that the person has asked for help with.  Keep the person involved in daily tasks and decisions as much as possible.  Encourage conversation, but try not to get frustrated or harried if the person struggles to find words or does not seem to appreciate your help. Communication tips  When the person is talking or seems frustrated, make eye contact and hold the person's hand.  Ask specific questions that need yes or no answers.  Use simple words, short sentences, and a calm voice. Only give one direction at a time.  When offering choices, limit them to just 1 or 2.  Avoid correcting the person in a negative way.  If the person is struggling to find the right words, gently try to help him or her. How to recognize symptoms of stress Symptoms of stress in caregivers include:  Feeling frustrated or angry with the person with  dementia.  Denying that the person has dementia or that his or her symptoms will not improve.  Feeling hopeless and unappreciated.  Difficulty sleeping.  Difficulty concentrating.  Feeling anxious, irritable, or depressed.  Developing stress-related health problems.  Feeling like you have too little time for your own life. Follow these instructions at home:   Make sure that you and the person you are caring for: ? Get regular sleep. ? Exercise regularly. ? Eat regular, nutritious meals. ? Drink enough fluid to keep your urine clear or pale yellow. ? Take over-the-counter and prescription medicines only as told by your health care providers. ? Attend all scheduled health care appointments.  Join a support group with others who are caregivers.  Ask about respite care resources so that you can have a regular break from the stress of caregiving.  Look for signs of stress in yourself and in the person you are caring for. If you notice signs of stress, take steps to manage it.  Consider any safety risks and take steps to avoid them.  Organize medications in a pill box for each day of the week.  Create a plan to handle any legal or financial matters. Get legal or financial advice if needed.  Keep a calendar in a central location to remind the person of appointments or other activities. Tips for reducing the risk of injury  Keep floors clear of clutter. Remove rugs, magazine racks, and floor lamps.  Keep hallways well lit, especially at night.  Put a handrail and nonslip mat in the  bathtub or shower.  Put childproof locks on cabinets that contain dangerous items, such as medicines, alcohol, guns, toxic cleaning items, sharp tools or utensils, matches, and lighters.  Put the locks in places where the person cannot see or reach them easily. This will help ensure that the person does not wander out of the house and get lost.  Be prepared for emergencies. Keep a list of  emergency phone numbers and addresses in a convenient area.  Remove car keys and lock garage doors so that the person does not try to get in the car and drive.  Have the person wear a bracelet that tracks locations and identifies the person as having memory problems. This should be worn at all times for safety. Where to find support: Many individuals and organizations offer support. These include:  Support groups for people with dementia and for caregivers.  Counselors or therapists.  Home health care services.  Adult day care centers. Where to find more information Alzheimer's Association: [employer] Contact a health care provider if:  The person's health is rapidly getting worse.  You are no longer able to care for the person.  Caring for the person is affecting your physical and emotional health.  The person threatens himself or herself, you, or anyone else. Summary  Dementia is a term used to describe a number of symptoms that affect memory and thinking.  Dementia usually gets worse slowly over time.  Take steps to reduce the person's risk of injury, and to plan for future care.  Caregivers need support, relief from caregiving, and time for their own lives. This information is not intended to replace advice given to you by your health care provider. Make sure you discuss any questions you have with your health care provider. Document Revised: 01/03/2017 Document Reviewed: 12/26/2015 Elsevier Patient Education  2020 Elsevier [employer].   Quetiapine tablets What is this medicine? QUETIAPINE (kwe TYE a peen) is an antipsychotic. It is used to treat schizophrenia and bipolar disorder, also known as manic-depression. This medicine may be used for other purposes; ask your health care provider or pharmacist if you have questions. COMMON BRAND NAME(S): Seroquel What should I tell my health care provider before I take this medicine? They need to know if you have any of these  conditions:  blockage in your bowel  cataracts  constipation  dehydration  diabetes  difficulty swallowing  glaucoma  heart disease  history of breast cancer  kidney disease  liver disease  low blood counts, like low white cell, platelet, or red cell counts  low blood pressure or dizziness when standing up  Parkinson's disease  previous heart attack  prostate disease  seizures  stomach or intestine problems  suicidal thoughts, plans or attempt; a previous suicide attempt by you or a family member  thyroid disease  trouble passing urine  an unusual or allergic reaction to quetiapine, other medicines, foods, dyes, or preservatives  pregnant or trying to get pregnant  breast-feeding How should I use this medicine? Take this medicine by mouth. Swallow it with a drink of water. Follow the directions on the prescription label. If it upsets your stomach you can take it with food. Take your medicine at regular intervals. Do not take it more often than directed. Do not stop taking except on the advice of your doctor or health care professional. A special MedGuide will be given to you by the pharmacist with each prescription and refill. Be sure to read this information carefully each  time. Talk to your pediatrician regarding the use of this medicine in children. While this drug may be prescribed for children as young as 10 years for selected conditions, precautions do apply. Patients over age 46 years may have a stronger reaction to this medicine and need smaller doses. Overdosage: If you think you have taken too much of this medicine contact a poison control center or emergency room at once. NOTE: This medicine is only for you. Do not share this medicine with others. What if I miss a dose? If you miss a dose, take it as soon as you can. If it is almost time for your next dose, take only that dose. Do not take double or extra doses. What may interact with this  medicine? Do not take this medicine with any of the following medications:  cisapride  dronedarone  fluconazole  metoclopramide  pimozide  posaconazole  thioridazine This medicine may also interact with the following medications:  alcohol  antihistamines for allergy cough and cold  antiviral medicines for HIV or AIDS  atropine  certain medicines for bladder problems like oxybutynin, tolterodine  certain medicines for blood pressure  certain medicines for depression, anxiety, or psychotic disturbances  certain medicines for diabetes  certain medicines for stomach problems like dicyclomine, hyoscyamine  certain medicines for travel sickness like scopolamine  certain medicines for Parkinson's disease  certain medicines for seizures like carbamazepine, phenobarbital, phenytoin  cimetidine  erythromycin  ipratropium  other medicines that prolong the QT interval (cause an abnormal heart rhythm) like dofetilide  rifampin  steroid medicines like prednisone or cortisone This list may not describe all possible interactions. Give your health care provider a list of all the medicines, herbs, non-prescription drugs, or dietary supplements you use. Also tell them if you smoke, drink alcohol, or use illegal drugs. Some items may interact with your medicine. What should I watch for while using this medicine? Visit your health care professional for regular checks on your progress. Tell your health care professional if symptoms do not start to get better or if they get worse. Do not stop taking except on your health care professional's advice. You may develop a severe reaction. Your health care professional will tell you how much medicine to take. You may need to have an eye exam before and during use of this medicine. This medicine may increase blood sugar. Ask your health care provider if changes in diet or medicines are needed if you have diabetes. Patients and their  families should watch out for new or worsening depression or thoughts of suicide. Also watch out for sudden or severe changes in feelings such as feeling anxious, agitated, panicky, irritable, hostile, aggressive, impulsive, severely restless, overly excited and hyperactive, or not being able to sleep. If this happens, especially at the beginning of antidepressant treatment or after a change in dose, call your health care professional. Bonita Quin may get dizzy or drowsy. Do not drive, use machinery, or do anything that needs mental alertness until you know how this medicine affects you. Do not stand or sit up quickly, especially if you are an older patient. This reduces the risk of dizzy or fainting spells. Alcohol may interfere with the effect of this medicine. Avoid alcoholic drinks. This drug can cause problems with controlling your body temperature. It can lower the response of your body to cold temperatures. If possible, stay indoors during cold weather. If you must go outdoors, wear warm clothes. It can also lower the response of your body  to heat. Do not overheat. Do not over-exercise. Stay out of the sun when possible. If you must be in the sun, wear cool clothing. Drink plenty of water. If you have trouble controlling your body temperature, call your health care provider right away. What side effects may I notice from receiving this medicine? Side effects that you should report to your doctor or health care professional as soon as possible:  allergic reactions like skin rash, itching or hives, swelling of the face, lips, or tongue  breathing problems  changes in vision  confusion  elevated mood, decreased need for sleep, racing thoughts, impulsive behavior  eye pain  fast, irregular heartbeat  fever or chills, sore throat  inability to keep still  males: prolonged or painful erection  problems with balance, talking, walking  redness, blistering, peeling, or loosening of the skin,  including inside the mouth  seizures  signs and symptoms of high blood sugar such as being more thirsty or hungry or having to urinate more than normal. You may also feel very tired or have blurry vision  signs and symptoms of hypothyroidism like fatigue; increased sensitivity to cold; weight gain; hoarseness; thinning hair  signs and symptoms of low blood pressure like dizziness; feeling faint or lightheaded; falls; unusually weak or tired  signs and symptoms of neuroleptic malignant syndrome like confusion; fast, irregular heartbeat; high fever; increased sweating; stiff muscles  sudden numbness or weakness of the face, arm, or leg  suicidal thoughts or other mood changes  trouble swallowing  uncontrollable movements of the arms, face, head, mouth, neck, or upper body Side effects that usually do not require medical attention (report to your doctor or health care professional if they continue or are bothersome):  change in sex drive or performance  constipation  drowsiness  dry mouth  upset stomach  weight gain This list may not describe all possible side effects. Call your doctor for medical advice about side effects. You may report side effects to FDA at 1-800-FDA-1088. Where should I keep my medicine? Keep out of the reach of children. Store at room temperature between 15 and 30 degrees C (59 and 86 degrees F). Throw away any unused medicine after the expiration date. NOTE: This sheet is a summary. It may not cover all possible information. If you have questions about this medicine, talk to your doctor, pharmacist, or health care provider.  2020 Elsevier/Gold Standard (2018-11-18 11:59:11)

## 2019-11-30 LAB — CBC WITH DIFFERENTIAL/PLATELET
Absolute Monocytes: 490 cells/uL (ref 200–950)
Basophils Absolute: 62 cells/uL (ref 0–200)
Basophils Relative: 0.9 %
Eosinophils Absolute: 304 cells/uL (ref 15–500)
Eosinophils Relative: 4.4 %
HCT: 42.6 % (ref 38.5–50.0)
Hemoglobin: 14.5 g/dL (ref 13.2–17.1)
Lymphs Abs: 2146 cells/uL (ref 850–3900)
MCH: 32.7 pg (ref 27.0–33.0)
MCHC: 34 g/dL (ref 32.0–36.0)
MCV: 95.9 fL (ref 80.0–100.0)
MPV: 10.7 fL (ref 7.5–12.5)
Monocytes Relative: 7.1 %
Neutro Abs: 3899 cells/uL (ref 1500–7800)
Neutrophils Relative %: 56.5 %
Platelets: 243 10*3/uL (ref 140–400)
RBC: 4.44 10*6/uL (ref 4.20–5.80)
RDW: 11.5 % (ref 11.0–15.0)
Total Lymphocyte: 31.1 %
WBC: 6.9 10*3/uL (ref 3.8–10.8)

## 2019-11-30 LAB — COMPLETE METABOLIC PANEL WITH GFR
AG Ratio: 1.8 (calc) (ref 1.0–2.5)
ALT: 15 U/L (ref 9–46)
AST: 17 U/L (ref 10–35)
Albumin: 4 g/dL (ref 3.6–5.1)
Alkaline phosphatase (APISO): 57 U/L (ref 35–144)
BUN: 16 mg/dL (ref 7–25)
CO2: 31 mmol/L (ref 20–32)
Calcium: 9.1 mg/dL (ref 8.6–10.3)
Chloride: 98 mmol/L (ref 98–110)
Creat: 0.76 mg/dL (ref 0.70–1.18)
GFR, Est African American: 101 mL/min/{1.73_m2} (ref >=60)
GFR, Est Non African American: 87 mL/min/{1.73_m2} (ref >=60)
Globulin: 2.2 g/dL (calc) (ref 1.9–3.7)
Glucose, Bld: 100 mg/dL — ABNORMAL HIGH (ref 65–99)
Potassium: 4.6 mmol/L (ref 3.5–5.3)
Sodium: 136 mmol/L (ref 135–146)
Total Bilirubin: 0.5 mg/dL (ref 0.2–1.2)
Total Protein: 6.2 g/dL (ref 6.1–8.1)

## 2019-11-30 LAB — TSH: TSH: 0.86 mIU/L (ref 0.40–4.50)

## 2019-12-01 ENCOUNTER — Telehealth: Payer: Self-pay

## 2019-12-01 NOTE — Telephone Encounter (Signed)
Spoke with patient's son Cameron Mcclain; he states agitation seems worse in last 2 nights since starting seroquel. He is concerned about possible effect of med, would like to try holding the medication tonight and evaluating prior to any attempts to increase dose. He states will follow up with [place] in 1-2 days. Plans to stay with parents tonight to monitor closely.

## 2019-12-01 NOTE — Telephone Encounter (Signed)
Cameron Mcclain states that taking the Seroquel hasn't helped at all, delusional on the 1st night and didn't sleep at all and also delusional on the 2nd night. Is getting more confused. Please advise.

## 2019-12-16 NOTE — Progress Notes (Signed)
Assessment and Plan:  Cameron Mcclain was seen today for aggressive behavior.  Diagnoses and all orders for this visit:  Moderate dementia with behavioral disturbance (HCC) Gradual change over recent months; Sundowning; HPI and exam suggestive of progression of dementia; cost limiting sitter, memory care admission Declining neuro evaluation Son feels he and mom are able to manage patient fairly if intermittent PM agitation were improved - continue seroquel Otherwise remarkably well and can complete most ADLs with verbal reminders and minimal assistance Good appetite, working on fluid intake Goal is for quality of life and staying in home as long as possible  No safety concerns other than with current intermittent agitation, no wandering Continue low dose seroquel; have discussed risk of increased mortality in elders; son expresses desire to proceed with low dose trial  -     QUEtiapine (SEROQUEL) 25 MG tablet; Take 1-2 tablets (25-50 mg total) by mouth at bedtime. For agitation in the evening.  Balanitis -     ketoconazole (NIZORAL) 2 % cream; APPLY EXTERNALLY TO THE AFFECTED AREA TWICE DAILY  Seborrheic dermatitis -     Ciclopirox 1 % shampoo; 1 Application daily to scalp and leave for 3-5 min  Further disposition pending results of labs. Discussed med's effects and SE's.   Over 30 minutes of exam, counseling, chart review, and critical decision making was performed.   Future Appointments  Date Time Provider Department Center  02/23/2020 10:30 AM McClanahan, Cameron Kennedy, NP [facility] None    ------------------------------------------------------------------------------------------------------------------   HPI BP 122/72   Pulse 80   Temp (!) 97.2 F (36.2 C)   Wt 137 lb (62.1 kg)   SpO2 96%   BMI 20.83 kg/m   79 y.o.male with htn, hyperlipidemia, former smoker, COPD, moderate dementia presents for evaluation accompanied by [name] for follow up on increased agitation. He had  normal CBC, CMP, TSH, UA on 11/26/2019 were normal.   He has progressive dementia, recently with increased agitation at night when waking up and was throwing things at wife, discussed and initiated low dose seroquel, 25 mg nightly.   Here with son, he reports patient seemed to be doing better with 50 mg, initially was some concern with causing more agitation. He also reports wife is primary caregiver, felt patient was doing better, she wanted to get patient off and stopped giving 4 days ago. He has continued to do well despite this, denies notable agitation events in the last few days.   Patient has had progressive Dementia for several years and wife has discontinued his Aricept & Namenda for concern of perceived side-effects and lack of perceived benefits.  Patient has a very short term recall, but does remain independent in personal ADL's w/ supervision/instruction- mainly just one task at a time- can not do multiple tasks. They have home care worker to come in the house, wife will occ make sandwichs and cook, he is not driving and wife is not driving. [name], son, assists with driving and any other needs. He has quick call system so if mom needs help he can be there in 5 min. Discusse palliative/hospice but doesn't qualify. Can't afford overnight sitter. Wife doesn't want stranger in home and managing fairly if can improve agitation. Likely needs memory care. Head CT scan in 2013 showed atrophy. Have declined referral to neurology for evaluation.    Also concerned about rash to penis and face/scalp today.   BMI is Body mass index is 20.83 kg/m.,  Wt Readings from Last 3 Encounters:  12/20/19 137 lb (62.1 kg)  11/29/19 137 lb (62.1 kg)  10/21/19 135 lb (61.2 kg)      Past Medical History:  Diagnosis Date  . Glaucoma   . OSA (obstructive sleep apnea)   . RLS (restless legs syndrome)   . Vitamin D deficiency      Allergies  Allergen Reactions  . Benadryl [Diphenhydramine Hcl] Other  (See Comments)    Hallucinations, itching, shaking    Current Outpatient Medications on File Prior to Visit  Medication Sig  . triamcinolone cream (KENALOG) 0.5 % Apply 1 application topically 2 (two) times daily.  . calcium carbonate (OS-CAL) 600 MG tablet Takes 1 tab / day  . Cholecalciferol (VITAMIN D) 2000 units CAPS Takes 1 cap 2 x / day  . ezetimibe (ZETIA) 10 MG tablet Take 1 tablet Daily for Cholesterol (Patient not taking: Reported on 11/29/2019)  . gemfibrozil (LOPID) 600 MG tablet Take 1 tablet 2 x /day with Meals for Triglycerides (Blood Fats) (Patient not taking: Reported on 11/29/2019)  . ketoconazole (NIZORAL) 2 % cream APPLY EXTERNALLY TO THE AFFECTED AREA TWICE DAILY (Patient not taking: Reported on 12/20/2019)  . latanoprost (XALATAN) 0.005 % ophthalmic solution Place 1 drop into both eyes at bedtime.   Marland Kitchen loratadine (CLARITIN) 10 MG tablet Take 10 mg by mouth daily.  . Magnesium 250 MG TABS Takes 1 tab / day  . Omega-3 Fatty Acids (FISH OIL) 1000 MG CAPS Take 1 capsule (1,000 mg total) by mouth 2 (two) times daily.  . QUEtiapine (SEROQUEL) 25 MG tablet Take 1 tablet (25 mg total) by mouth at bedtime. (Patient not taking: Reported on 12/20/2019)  . TURMERIC PO Take by mouth daily. (Patient not taking: Reported on 11/29/2019)   No current facility-administered medications on file prior to visit.    ROS: all negative except above.   Physical Exam:  BP 122/72   Pulse 80   Temp (!) 97.2 F (36.2 C)   Wt 137 lb (62.1 kg)   SpO2 96%   BMI 20.83 kg/m   General appearance: alert, no distress, WD/WN, male HEENT: normocephalic, sclerae anicteric, ext aud canals bilaterally normal, nares patent, no discharge or erythema, pharynx normal Oral cavity: MMM, no lesions Neck: supple, no lymphadenopathy, no thyromegaly, no masses Heart: RRR, normal S1, S2, no murmurs Lungs: CTA bilaterally, no wheezes, rhonchi, or rales Abdomen: +bs, soft, non tender, non distended, no masses,  no hepatomegaly, no splenomegaly Musculoskeletal: nontender, no swelling, no obvious deformity Extremities: 1-2 edema, no cyanosis, no clubbing Penis: he has erythematous rash inferior to penis along frenulum with white thick discharge Pulses: 2+ symmetric, upper and lower extremities, normal cap refill Neurological: alert, oriented x 3, CN2-12 intact, strength normal upper extremities and lower extremities,  throughout, DTRs 2+ throughout, no cerebellar signs, gait steady Psychiatric: normal affect, behavior normal, pleasant, short term memory extremely poor- 0/3 word on immediate recall, insight poor, becomes easily frustrated/mildly agitated Skin: patchy, greasy/flaky rash to scalp and neck; he also has mildly erythematous rash with flaking periorally, across brows   Dan Maker, NP 9:54 AM [facility]

## 2019-12-17 ENCOUNTER — Other Ambulatory Visit: Payer: Self-pay

## 2019-12-17 DIAGNOSIS — F03B18 Unspecified dementia, moderate, with other behavioral disturbance: Secondary | ICD-10-CM

## 2019-12-17 MED ORDER — QUETIAPINE FUMARATE 25 MG PO TABS: 25.0000 mg | ORAL_TABLET | Freq: Every day | ORAL | 0 refills | Status: DC

## 2019-12-20 ENCOUNTER — Encounter: Payer: Self-pay | Admitting: Adult Health

## 2019-12-20 ENCOUNTER — Ambulatory Visit (INDEPENDENT_AMBULATORY_CARE_PROVIDER_SITE_OTHER): Payer: PPO | Admitting: Adult Health

## 2019-12-20 ENCOUNTER — Other Ambulatory Visit: Payer: Self-pay

## 2019-12-20 VITALS — BP 122/72 | HR 80 | Temp 97.2°F | Wt 137.0 lb

## 2019-12-20 DIAGNOSIS — N481 Balanitis: Secondary | ICD-10-CM | POA: Insufficient documentation

## 2019-12-20 DIAGNOSIS — F0391 Unspecified dementia with behavioral disturbance: Secondary | ICD-10-CM

## 2019-12-20 DIAGNOSIS — F03B18 Unspecified dementia, moderate, with other behavioral disturbance: Secondary | ICD-10-CM

## 2019-12-20 DIAGNOSIS — L219 Seborrheic dermatitis, unspecified: Secondary | ICD-10-CM

## 2019-12-20 MED ORDER — KETOCONAZOLE 2 % EX CREA: TOPICAL_CREAM | CUTANEOUS | 3 refills | Status: DC

## 2019-12-20 MED ORDER — QUETIAPINE FUMARATE 25 MG PO TABS: 25.0000 mg | ORAL_TABLET | Freq: Every day | ORAL | 2 refills | Status: DC

## 2019-12-20 MED ORDER — TRIAMCINOLONE ACETONIDE 0.1 % EX OINT: 1.0000 "application " | TOPICAL_OINTMENT | Freq: Two times a day (BID) | CUTANEOUS | 0 refills | Status: DC

## 2019-12-20 MED ORDER — CICLOPIROX 1 % EX SHAM: MEDICATED_SHAMPOO | CUTANEOUS | 2 refills | Status: DC

## 2019-12-20 NOTE — Patient Instructions (Addendum)
Ketoconazole for penis rash -   Triamcinolong 0.1% sparingly to face, 1-2 times a day, if he starts rubbing face, need to stop to avoid getting in eyes     Balanitis  Balanitis is swelling and irritation (inflammation) of the head of the penis (glans penis). The condition may also cause inflammation of the skin around the glans penis (foreskin) in men who have not been circumcised. It may develop because of an infection or another medical condition. Balanitis occurs most often among men who have not had their foreskin removed (uncircumcised men). Balanitis sometimes causes scarring of the penis or foreskin, which can require surgery. Untreated balanitis can increase the risk of penile cancer. What are the causes? Common causes of this condition include:  Poor personal hygiene, especially in uncircumcised men. Not cleaning the glans penis and foreskin well can result in buildup of bacteria, viruses, and yeast, which can lead to infection and inflammation.  Irritation and lack of air flow due to fluid (smegma) that can build up on the glans penis. Other causes include:  Chemical irritation from products such as soaps or shower gels (especially those that have fragrance), condoms, personal lubricants, petroleum jelly, spermicides, or fabric softeners.  Skin conditions, such as eczema, dermatitis, and psoriasis.  Allergies to medicines, such as tetracycline and sulfa drugs.  Certain medical conditions, including liver cirrhosis, congestive heart failure, diabetes, and kidney disease.  Infections, such as candidiasis, HPV (human papillomavirus), herpes simplex, gonorrhea, and syphilis.  Severe obesity. What increases the risk? The following factors may make you more likely to develop this condition:  Having diabetes. This is the most common risk factor.  Having a tight foreskin that is difficult to pull back (retract) past the glans.  Having sexual intercourse without using a  condom. What are the signs or symptoms? Symptoms of this condition include:  Discharge from under the foreskin.  A bad smell.  Pain or difficulty retracting the foreskin.  Tenderness, redness, and swelling of the glans.  A rash or sores on the glans or foreskin.  Itchiness.  Inability to get an erection due to pain.  Difficulty urinating.  Scarring of the penis or foreskin, in some cases. How is this diagnosed? This condition may be diagnosed based on:  A physical exam.  Testing a swab of discharge to check for bacterial or fungal infection.  Blood tests: ? To check for viruses that can cause balanitis. ? To check your blood sugar (glucose) level. High blood glucose could be a sign of diabetes, which can cause balanitis. How is this treated? Treatment for balanitis depends on the cause. Treatment may include:  Improving personal hygiene. Your health care provider may recommend sitting in a bath of warm water that is deep enough to cover your hips and buttocks (sitz bath).  Medicines such as: ? Creams or ointments to reduce swelling (steroids) or to treat an infection. ? Antibiotic medicine. ? Antifungal medicine.  Surgery to remove or cut the foreskin (circumcision). This may be done if you have scarring on the foreskin that makes it difficult to retract.  Controlling other medical problems that may be causing your condition or making it worse. Follow these instructions at home:  Do not have sex until the condition clears up, or until your health care provider approves.  Keep your penis clean and dry. Take sitz baths as recommended by your health care provider.  Avoid products that irritate your skin or make symptoms worse, such as soaps and shower gels  that have fragrance.  Take over-the-counter and prescription medicines only as told by your health care provider. ? If you were prescribed an antibiotic medicine or a cream or ointment, use it as told by your  health care provider. Do not stop using your medicine, cream, or ointment even if you start to feel better. ? Do not drive or use heavy machinery while taking prescription pain medicine. Contact a health care provider if:  Your symptoms get worse or do not improve with home care.  You develop chills or a fever.  You have trouble urinating.  You cannot retract your foreskin. Get help right away if:  You develop severe pain.  You are unable to urinate. Summary  Balanitis is inflammation of the head of the penis (glans penis) caused by irritation or infection.  Balanitis causes pain, redness, and swelling of the glans penis.  This condition is most common among uncircumcised men who do not keep their glans penis clean and in men who have diabetes.  Treatment may include creams or ointments.  Good hygiene is important for prevention. This includes pulling back the foreskin when washing your penis. This information is not intended to replace advice given to you by your health care provider. Make sure you discuss any questions you have with your health care provider. Document Revised: 01/03/2017 Document Reviewed: 12/11/2015 Elsevier Patient Education  2020 [employer].

## 2019-12-28 ENCOUNTER — Other Ambulatory Visit: Payer: Self-pay

## 2019-12-28 MED ORDER — CLOTRIMAZOLE 1 % EX CREA: 1.0000 "application " | TOPICAL_CREAM | Freq: Two times a day (BID) | CUTANEOUS | 0 refills | Status: DC

## 2019-12-28 MED ORDER — FLUCONAZOLE 150 MG PO TABS: ORAL_TABLET | ORAL | 1 refills | Status: DC

## 2020-01-14 ENCOUNTER — Ambulatory Visit (INDEPENDENT_AMBULATORY_CARE_PROVIDER_SITE_OTHER): Payer: PPO | Admitting: Adult Health Nurse Practitioner

## 2020-01-14 ENCOUNTER — Other Ambulatory Visit: Payer: Self-pay

## 2020-01-14 ENCOUNTER — Encounter: Payer: Self-pay | Admitting: Adult Health Nurse Practitioner

## 2020-01-14 VITALS — BP 124/80 | HR 64 | Temp 97.7°F | Wt 137.0 lb

## 2020-01-14 DIAGNOSIS — N481 Balanitis: Secondary | ICD-10-CM

## 2020-01-14 DIAGNOSIS — L219 Seborrheic dermatitis, unspecified: Secondary | ICD-10-CM | POA: Diagnosis not present

## 2020-01-14 MED ORDER — FLUCONAZOLE 150 MG PO TABS: ORAL_TABLET | ORAL | 1 refills | Status: DC

## 2020-01-14 MED ORDER — TRIAMCINOLONE ACETONIDE 0.1 % EX OINT: 1.0000 "application " | TOPICAL_OINTMENT | Freq: Two times a day (BID) | CUTANEOUS | 0 refills | Status: DC

## 2020-01-14 MED ORDER — KETOCONAZOLE 2 % EX CREA: TOPICAL_CREAM | CUTANEOUS | 3 refills | Status: DC

## 2020-01-14 NOTE — Progress Notes (Signed)
Assessment and Plan:  Ardit was seen today for lesion.  Diagnoses and all orders for this visit:  Balanitis -     ketoconazole (NIZORAL) 2 % cream; APPLY EXTERNALLY TO THE AFFECTED AREA TWICE DAILY -     fluconazole (DIFLUCAN) 150 MG tablet; Take one tablet once and then repeat in one week for yeast if needed.  Seborrheic dermatitis -     triamcinolone ointment (KENALOG) 0.1 %; Apply 1 application topically 2 (two) times daily. To face rash for 1-2 weeks then stop.  Discussed care with son in law.  Printed instruction provided.      Further disposition pending results of labs. Discussed med's effects and SE's.   Over 30 minutes of exam, counseling, chart review, and critical decision making was performed.   Future Appointments  Date Time Provider Department Center  02/23/2020 10:30 AM Elder Negus, NP [facility] None    ------------------------------------------------------------------------------------------------------------------   HPI 79 y.o.male presents for evalutaion of rash on penis.  He is here with son in law Conroad today.  He reports that he has not heard as many complaints about it but it is still there.  He took a round of fluconazole, which helped some.  His wife is helping him with this area.  Son in law is aware of the problem but does not help with care of this.    Conroad reports he is missing the door hanndle when going to exit out doors.  Today he thought the glass door was open and ran into it.  Overall he is doing.  Past Medical History:  Diagnosis Date  . Glaucoma   . OSA (obstructive sleep apnea)   . RLS (restless legs syndrome)   . Vitamin D deficiency      Allergies  Allergen Reactions  . Benadryl [Diphenhydramine Hcl] Other (See Comments)    Hallucinations, itching, shaking    Current Outpatient Medications on File Prior to Visit  Medication Sig  . calcium carbonate (OS-CAL) 600 MG tablet Takes 1 tab / day  . Cholecalciferol (VITAMIN  D) 2000 units CAPS Takes 1 cap 2 x / day  . Ciclopirox 1 % shampoo 1 Application daily to scalp and leave for 3-5 min  . clotrimazole (LOTRIMIN) 1 % cream Apply 1 application topically 2 (two) times daily.  Marland Kitchen ezetimibe (ZETIA) 10 MG tablet Take 1 tablet Daily for Cholesterol (Patient not taking: Reported on 11/29/2019)  . fluconazole (DIFLUCAN) 150 MG tablet Take one tablet once and then repeat in one week for yeast if needed.  Marland Kitchen gemfibrozil (LOPID) 600 MG tablet Take 1 tablet 2 x /day with Meals for Triglycerides (Blood Fats) (Patient not taking: Reported on 11/29/2019)  . ketoconazole (NIZORAL) 2 % cream APPLY EXTERNALLY TO THE AFFECTED AREA TWICE DAILY  . latanoprost (XALATAN) 0.005 % ophthalmic solution Place 1 drop into both eyes at bedtime.   Marland Kitchen loratadine (CLARITIN) 10 MG tablet Take 10 mg by mouth daily.  . Magnesium 250 MG TABS Takes 1 tab / day  . Omega-3 Fatty Acids (FISH OIL) 1000 MG CAPS Take 1 capsule (1,000 mg total) by mouth 2 (two) times daily.  . QUEtiapine (SEROQUEL) 25 MG tablet Take 1-2 tablets (25-50 mg total) by mouth at bedtime. For agitation in the evening.  . triamcinolone ointment (KENALOG) 0.1 % Apply 1 application topically 2 (two) times daily. To face rash for 1-2 weeks then stop.  . TURMERIC PO Take by mouth daily. (Patient not taking: Reported on 11/29/2019)   No  current facility-administered medications on file prior to visit.    ROS: all negative except above.   Physical Exam:  BP 124/80   Pulse 64   Temp 97.7 F (36.5 C)   Wt 137 lb (62.1 kg)   SpO2 99%   BMI 20.83 kg/m   General Appearance: Well nourished, in no apparent distress. Eyes: PERRLA, EOMs, conjunctiva no swelling or erythema Sinuses: No Frontal/maxillary tenderness ENT/Mouth: Ext aud canals clear, TMs without erythema, bulging. No erythema, swelling, or exudate on post pharynx.  Tonsils not swollen or erythematous. Hearing normal.  Neck: Supple, thyroid normal.  Respiratory:  Respiratory effort normal, BS equal bilaterally without rales, rhonchi, wheezing or stridor.  Cardio: RRR with no MRGs. Brisk peripheral pulses without edema.  Abdomen: Soft, + BS.  Non tender, no guarding, rebound, hernias, masses. Lymphatics: Non tender without lymphadenopathy.  Musculoskeletal: Full ROM, 5/5 strength, normal gait.  Skin: Warm, dry without rashes, lesions, ecchymosis. Penile irration not present, mild white yeast noted under foreskin. Neuro: Cranial nerves intact. Normal muscle tone, no cerebellar symptoms. Sensation intact.  Psych: Awake and oriented X 3, normal affect, Insight and Judgment appropriate.     Elder Negus, NP 11:27 AM [facility]

## 2020-01-14 NOTE — Patient Instructions (Addendum)
    Be sure to pull back skin on penis to cleanse with bathing/showering.  Also do the same to pat dry to prevent build up of yesat under the foreskin around the head on the penis.   You can use topical Ketoconazole to the area.    Use triamcinolone to the area on his forehead and around his mouth.   Aslo try Selson Blue for dry areas on scalp.

## 2020-02-10 ENCOUNTER — Encounter: Payer: Self-pay | Admitting: Adult Health Nurse Practitioner

## 2020-02-11 ENCOUNTER — Ambulatory Visit: Payer: Self-pay | Attending: Internal Medicine

## 2020-02-11 DIAGNOSIS — Z23 Encounter for immunization: Secondary | ICD-10-CM

## 2020-02-11 NOTE — Progress Notes (Signed)
   Covid-19 Vaccination Clinic  Name:  Cameron Mcclain    MRN: [id number] DOB: 20-Nov-1940  02/11/2020  [name] was observed post Covid-19 immunization for 15 minutes without incident. He was provided with Vaccine Information Sheet and instruction to access the V-Safe system.   [name] was instructed to call 911 with any severe reactions post vaccine: Marland Kitchen Difficulty breathing  . Swelling of face and throat  . A fast heartbeat  . A bad rash all over body  . Dizziness and weakness   Immunizations Administered    Name Date Dose VIS Date Route   Pfizer COVID-19 Vaccine 02/11/2020  2:09 PM 0.3 mL 11/24/2019 Intramuscular   Manufacturer: [employer], [employer]   Lot: [id number]   NDC: [address]-7

## 2020-02-23 ENCOUNTER — Ambulatory Visit: Payer: PPO | Admitting: Adult Health Nurse Practitioner

## 2020-03-15 ENCOUNTER — Other Ambulatory Visit: Payer: Self-pay

## 2020-03-15 ENCOUNTER — Encounter: Payer: Self-pay | Admitting: Adult Health Nurse Practitioner

## 2020-03-15 ENCOUNTER — Ambulatory Visit (INDEPENDENT_AMBULATORY_CARE_PROVIDER_SITE_OTHER): Payer: PPO | Admitting: Adult Health Nurse Practitioner

## 2020-03-15 VITALS — BP 130/70 | HR 63 | Temp 97.6°F | Ht 68.0 in | Wt 132.0 lb

## 2020-03-15 DIAGNOSIS — Z6823 Body mass index (BMI) 23.0-23.9, adult: Secondary | ICD-10-CM | POA: Diagnosis not present

## 2020-03-15 DIAGNOSIS — F0390 Unspecified dementia without behavioral disturbance: Secondary | ICD-10-CM | POA: Diagnosis not present

## 2020-03-15 DIAGNOSIS — Z6820 Body mass index (BMI) 20.0-20.9, adult: Secondary | ICD-10-CM | POA: Diagnosis not present

## 2020-03-15 DIAGNOSIS — I10 Essential (primary) hypertension: Secondary | ICD-10-CM | POA: Diagnosis not present

## 2020-03-15 DIAGNOSIS — G4733 Obstructive sleep apnea (adult) (pediatric): Secondary | ICD-10-CM | POA: Diagnosis not present

## 2020-03-15 DIAGNOSIS — E559 Vitamin D deficiency, unspecified: Secondary | ICD-10-CM

## 2020-03-15 DIAGNOSIS — H409 Unspecified glaucoma: Secondary | ICD-10-CM | POA: Diagnosis not present

## 2020-03-15 DIAGNOSIS — R6889 Other general symptoms and signs: Secondary | ICD-10-CM | POA: Diagnosis not present

## 2020-03-15 DIAGNOSIS — R7309 Other abnormal glucose: Secondary | ICD-10-CM | POA: Diagnosis not present

## 2020-03-15 DIAGNOSIS — Z0001 Encounter for general adult medical examination with abnormal findings: Secondary | ICD-10-CM

## 2020-03-15 DIAGNOSIS — N481 Balanitis: Secondary | ICD-10-CM

## 2020-03-15 DIAGNOSIS — Z79899 Other long term (current) drug therapy: Secondary | ICD-10-CM

## 2020-03-15 DIAGNOSIS — J449 Chronic obstructive pulmonary disease, unspecified: Secondary | ICD-10-CM

## 2020-03-15 DIAGNOSIS — Z0000 Encounter for general adult medical examination without abnormal findings: Secondary | ICD-10-CM

## 2020-03-15 DIAGNOSIS — F03B0 Unspecified dementia, moderate, without behavioral disturbance, psychotic disturbance, mood disturbance, and anxiety: Secondary | ICD-10-CM

## 2020-03-15 DIAGNOSIS — B009 Herpesviral infection, unspecified: Secondary | ICD-10-CM | POA: Diagnosis not present

## 2020-03-15 DIAGNOSIS — E782 Mixed hyperlipidemia: Secondary | ICD-10-CM | POA: Diagnosis not present

## 2020-03-15 NOTE — Progress Notes (Signed)
MEDICARE ANNUAL WELLNESS VISIT AND FOLLOW UP  Assessment:   Encounter for Medicare annual wellness exam 1 year  Essential hypertension Continue medication Monitor blood pressure at home; call if consistently over 130/80 Continue DASH diet.   Reminder to go to the ER if any CP, SOB, nausea, dizziness, severe HA, changes vision/speech, left arm numbness and tingling and jaw pain.  Chronic obstructive pulmonary disease, unspecified COPD type (HCC) Emphysema on imaging; stable without inhalers. Continue to monitor  OSA (obstructive sleep apnea) Previously on CPAP; hasn't used in years. No observed apnea by wife,    SDAT (senile dementia of Alzheimer's type) Continue to monitor -  Balance is declining, discussed PT, declines No longer treated by namenda/aricept  BMI 23.0-23.9, adult Discussed dietary and exercise modifications  Glaucoma, unspecified glaucoma type, unspecified laterality Continue follow up with ophthalmology  Hyperlipidemia Continue medications Continue low cholesterol diet and exercise.   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, treat as needed  BMI 20 Improved, has gained weight Eating routinely three times a day with snacks, good appetite Continue to monitor   Over 30 minutes of face to face interview, exam, counseling, chart review, and critical decision making was performed  Future Appointments  Date Time Provider Department Center  03/15/2021 10:30 AM Elder Negus, NP [facility] None    Plan:   During the course of the visit the patient was educated and counseled about appropriate  screening and preventive services including:    Pneumococcal vaccine   Influenza vaccine  Prevnar 13  Td vaccine  Screening electrocardiogram  Colorectal cancer screening  Diabetes screening  Glaucoma screening  Nutrition counseling    Subjective:  Cameron Mcclain is a 80 y.o. male who presents for Medicare Annual Wellness Visit and 3 month follow up for HTN, hyperlipidemia, glucose management, and vitamin D Def.   He is accompanies with his son-in law, Cameron Mcclain.  He reports that he is foggy in th morning but tends to improve after having breakfast and coffee for the day.  Patient has had a progressive Dementia for several years and wife has discontinued his Aricept & Namenda for concern of perceived side-effects and lack of perceived benefits.  Patient has a very short term recall, but does remain independent in personal ADL's w/ supervision and guidance by his wife.   They have home care worker to come in the house, Cameron Mcclain will occ make sandwichs and cook, Cameron Mcclain will , he is not driving and Cameron Mcclain is not driving.  Head CT scan in 2013 showed atrophy.   He has OSA formerly on CPAP, but ongoing need for CPAP for OSA since weight loss has been questioned; wife has been monitoring without observed episodes of apnea.   BMI is Body mass index is 20.07 kg/m., he has not been working on diet and exercise. Cameron Mcclain states sarah the mom has not been feeling well so Cameron Mcclain has not been eating well to explain the weight loss.   Wt Readings from Last 3 Encounters:  03/15/20 132 lb (59.9 kg)  01/14/20 137 lb (  62.1 kg)  12/20/19 137 lb (62.1 kg)   His blood pressure has been controlled at home, today their BP is BP: 130/70 He does not workout. He denies chest pain, shortness of breath, dizziness.   He is on cholesterol medication (gemfibrozil, zetia, omega 3) and denies myalgias. His cholesterol is not at goal. The cholesterol last visit was:   Lab Results  Component Value Date   CHOL  198 10/21/2019   HDL 54 10/21/2019   LDLCALC 125 (H) 10/21/2019   TRIG 90 10/21/2019   CHOLHDL 3.7 10/21/2019   He has not been working on diet and exercise for glucose management, and denies increased appetite, nausea, paresthesia of the feet, polydipsia, polyuria, visual disturbances, vomiting and weight loss. Last A1C in the office was:  Lab Results  Component Value Date   HGBA1C 5.2 07/21/2019   Last GFR Lab Results  Component Value Date   GFRNONAA 87 03/15/2020    Patient is on Vitamin D supplement and at goal as last check:    Lab Results  Component Value Date   VD25OH 66 11/11/2018       Medication Review:   Current Outpatient Medications (Cardiovascular):  .  ezetimibe (ZETIA) 10 MG tablet, Take 1 tablet Daily for Cholesterol .  gemfibrozil (LOPID) 600 MG tablet, Take 1 tablet 2 x /day with Meals for Triglycerides (Blood Fats)  Current Outpatient Medications (Respiratory):  .  loratadine (CLARITIN) 10 MG tablet, Take 10 mg by mouth daily.    Current Outpatient Medications (Other):  .  calcium carbonate (OS-CAL) 600 MG tablet, Takes 1 tab / day .  Cholecalciferol (VITAMIN D) 2000 units CAPS, Takes 1 cap 2 x / day .  Ciclopirox 1 % shampoo, 1 Application daily to scalp and leave for 3-5 min .  clotrimazole (LOTRIMIN) 1 % cream, Apply 1 application topically 2 (two) times daily. Marland Kitchen  ketoconazole (NIZORAL) 2 % cream, APPLY EXTERNALLY TO THE AFFECTED AREA TWICE DAILY .  latanoprost (XALATAN) 0.005 % ophthalmic solution, Place 1 drop into both eyes at bedtime.  .  Magnesium 250 MG TABS, Takes 1 tab / day .  Omega-3 Fatty Acids (FISH OIL) 1000 MG CAPS, Take 1 capsule (1,000 mg total) by mouth 2 (two) times daily. Marland Kitchen  triamcinolone ointment (KENALOG) 0.1 %, Apply 1 application topically 2 (two) times daily. To face rash for 1-2 weeks then stop. .  TURMERIC PO, Take by mouth daily.  Allergies: Allergies  Allergen Reactions  . Benadryl [Diphenhydramine Hcl] Other (See  Comments)    Hallucinations, itching, shaking    Current Problems (verified) has Essential hypertension; Hyperlipidemia, mixed; Abnormal glucose; Vitamin D deficiency; Medication management; Glaucoma; OSA (obstructive sleep apnea); BMI 23.0-23.9, adult; Chronic obstructive pulmonary disease (HCC); Encounter for Medicare annual wellness exam; Former smoker; Moderate dementia with behavioral disturbance (HCC); Aortic atherosclerosis (HCC); HSV-2 (herpes simplex virus 2) infection; and Balanitis on their problem list.  Screening Tests Immunization History  Administered Date(s) Administered  . Influenza Whole 11/23/2012  . Influenza, High Dose Seasonal PF 11/23/2013, 10/19/2014, 10/26/2015, 11/05/2016, 10/29/2017, 10/15/2018, 11/29/2019  . PFIZER(Purple Top)SARS-COV-2 Vaccination 02/11/2020  . Pneumococcal Conjugate-13 01/12/2014  . Pneumococcal Polysaccharide-23 12/02/2008  . Td 09/26/2010  . Zoster 02/25/2008   Health Maintenance  Topic Date Due  . Hepatitis C Screening  Never done  . COVID-19 Vaccine (2 - Pfizer 3-dose series) 03/03/2020  . TETANUS/TDAP  09/25/2020  . INFLUENZA VACCINE  Completed  . PNA vac Low Risk Adult  Completed  Preventative care: Last colonoscopy: 2008, - no further follow up per Dr. Cathrine Muster CT head 2013 atrophy CT angio chest 2015 - emphysema   DECLINES ALL SCANS/SCREENING   Names of Other Physician/Practitioners you currently use: 1.  [facility] here for primary care 2. Dr. Emily Filbert, eye doctor, last visit 2021, glaucoma, stable  3. Dr. Sharma Covert, 2019  Patient Care Team: Lucky Cowboy, MD as PCP - General (Internal Medicine)  Surgical: He  has a past surgical history that includes Pilonidal cyst / sinus excision (1959) and axillary (Right, 1986). Family His family history includes Cancer in his father; Diabetes in his mother; Hypertension in his father and mother. Social history  He reports that he  quit smoking about 10 years ago. He has never used smokeless tobacco. He reports current alcohol use. He reports that he does not use drugs.  MEDICARE WELLNESS OBJECTIVES: Physical activity: Current Exercise Habits: The patient does not participate in regular exercise at present, Exercise limited by: neurologic condition(s) Cardiac risk factors: Cardiac Risk Factors include: advanced age (>69men, >38 women);dyslipidemia;male gender;sedentary lifestyle   Depression/mood screen:   Depression screen [facility] 2/9 03/15/2020  Decreased Interest 0  Down, Depressed, Hopeless 0  PHQ - 2 Score 0    ADLs:  In your present state of health, do you have any difficulty performing the following activities: 03/15/2020 07/21/2019  Hearing? - N  Vision? N N  Difficulty concentrating or making decisions? Y Y  Comment Lives with wife, has caregiver, family -  Walking or climbing stairs? N N  Comment - slow and unstable- holds onto rail  Dressing or bathing? N N  Doing errands, shopping? Y Y  Comment Family transports -  Quarry manager and eating ? Y Y  Comment Family assist with meal preperation -  Using the Toilet? N N  In the past six months, have you accidently leaked urine? N N  Do you have problems with loss of bowel control? N N  Managing your Medications? Malvin Johns  Comment Family -  Managing your Finances? Y Y  Comment Family -  Housekeeping or managing your Housekeeping? Y Y  Comment Family assists -  Some recent data might be hidden     Cognitive Testing  Alert? Yes  Normal Appearance?Yes  Oriented to person? Yes  Place? Yes   Time? No- does not know  Recall of three objects?  No - cannot recall 0/3 on immediate recall   EOL planning: Does Patient Have a Medical Advance Directive?: Yes Type of Advance Directive: Healthcare Power of Attorney,Living will Copy of Healthcare Power of Attorney in Chart?: No - copy requested Yes  Objective:   Today's Vitals   03/15/20 1052  BP: 130/70  Pulse:  63  Temp: 97.6 F (36.4 C)  SpO2: 98%  Weight: 132 lb (59.9 kg)  Height: 5\' 8"  (1.727 m)  PainSc: 0-No pain   Body mass index is 20.07 kg/m.  General appearance: alert, no distress, WD/WN, male HEENT: normocephalic, sclerae anicteric, ext aud canals bilaterally obstructed by soft wax, nares patent, no discharge or erythema, pharynx normal Oral cavity: MMM, no lesions Neck: supple, no lymphadenopathy, no thyromegaly, no masses Heart: RRR, normal S1, S2, no murmurs Lungs: CTA bilaterally, no wheezes, rhonchi, or rales Abdomen: +bs, soft, non tender, non distended, no masses, no hepatomegaly, no splenomegaly Musculoskeletal: nontender, no swelling, no obvious deformity Extremities: 1-2 edema, no cyanosis, no clubbing Penis: no rash today, no ulcers, no discharge.  Pulses: 2+ symmetric,  upper and lower extremities, normal cap refill Neurological: alert, oriented x 3, CN2-12 intact, strength normal upper extremities and lower extremities, sensation normal throughout, DTRs 2+ throughout, no cerebellar signs, gait normal Psychiatric: normal affect, behavior normal, pleasant, short term memory extremely poor- 0/3 word on immediate recall, insight appropriate  Medicare Attestation I have personally reviewed: The patient's medical and social history Their use of alcohol, tobacco or illicit drugs Their current medications and supplements The patient's functional ability including ADLs,fall risks, home safety risks, cognitive, and hearing and visual impairment Diet and physical activities Evidence for depression or mood disorders  The patient's weight, height, BMI, and visual acuity have been recorded in the chart.  I have made referrals, counseling, and provided education to the patient based on review of the above and I have provided the patient with a written personalized care plan for preventive services.     Elder Negus, NP   03/22/2020

## 2020-03-16 LAB — CBC WITH DIFFERENTIAL/PLATELET
Absolute Monocytes: 594 cells/uL (ref 200–950)
Basophils Absolute: 79 cells/uL (ref 0–200)
Basophils Relative: 1.2 %
Eosinophils Absolute: 231 cells/uL (ref 15–500)
Eosinophils Relative: 3.5 %
HCT: 42.8 % (ref 38.5–50.0)
Hemoglobin: 14.6 g/dL (ref 13.2–17.1)
Lymphs Abs: 2587 cells/uL (ref 850–3900)
MCH: 33.2 pg — ABNORMAL HIGH (ref 27.0–33.0)
MCHC: 34.1 g/dL (ref 32.0–36.0)
MCV: 97.3 fL (ref 80.0–100.0)
MPV: 11.3 fL (ref 7.5–12.5)
Monocytes Relative: 9 %
Neutro Abs: 3109 cells/uL (ref 1500–7800)
Neutrophils Relative %: 47.1 %
Platelets: 226 10*3/uL (ref 140–400)
RBC: 4.4 10*6/uL (ref 4.20–5.80)
RDW: 11.6 % (ref 11.0–15.0)
Total Lymphocyte: 39.2 %
WBC: 6.6 10*3/uL (ref 3.8–10.8)

## 2020-03-16 LAB — COMPLETE METABOLIC PANEL WITH GFR
AG Ratio: 1.9 (calc) (ref 1.0–2.5)
ALT: 9 U/L (ref 9–46)
AST: 13 U/L (ref 10–35)
Albumin: 4.1 g/dL (ref 3.6–5.1)
Alkaline phosphatase (APISO): 52 U/L (ref 35–144)
BUN: 17 mg/dL (ref 7–25)
CO2: 33 mmol/L — ABNORMAL HIGH (ref 20–32)
Calcium: 9.3 mg/dL (ref 8.6–10.3)
Chloride: 101 mmol/L (ref 98–110)
Creat: 0.76 mg/dL (ref 0.70–1.18)
GFR, Est African American: 101 mL/min/{1.73_m2} (ref >=60)
GFR, Est Non African American: 87 mL/min/{1.73_m2} (ref >=60)
Globulin: 2.2 g/dL (calc) (ref 1.9–3.7)
Glucose, Bld: 78 mg/dL (ref 65–99)
Potassium: 4.3 mmol/L (ref 3.5–5.3)
Sodium: 138 mmol/L (ref 135–146)
Total Bilirubin: 0.7 mg/dL (ref 0.2–1.2)
Total Protein: 6.3 g/dL (ref 6.1–8.1)

## 2020-03-22 NOTE — Progress Notes (Signed)
Patient's son-in-law [name]) is aware of patient's lab results. -e welch

## 2020-09-25 ENCOUNTER — Encounter: Payer: Self-pay | Admitting: Internal Medicine

## 2020-09-25 NOTE — Progress Notes (Signed)
Future Appointments  Date Time Provider Department Center  09/26/2020  -  6 mo OV  10:30 AM Lucky Cowboy, MD [facility] None  03/15/2021    - Wellness 10:00 AM Revonda Humphrey, NP [facility] None    History of Present Illness:       This very nice 80 y.o. MWM presents for 6 month follow up with HTN, HLD, moderate Vascular Dementia, Pre-Diabetes and Vitamin D Deficiency. Patient has hx/o OSA, but is Mask intolerance.                                           Patient has hx/o  moderately severe Vascular Dementia and both Aricept & Namenda were d/c'd for lack of perceived benefit. Head CT scan in 2013 showed Atrophy. Patient manages personal hygiene and wife supervises activities otherwise. Step son Cydney Ok) brings for appointments.        Patient is treated for HTN  (1999) & BP has been controlled at home. Today's BP is at goal - 112/70. Patient has had no complaints of any cardiac type chest pain, palpitations, dyspnea / orthopnea / PND, dizziness, claudication, or dependent edema.       Hyperlipidemia is not controlled with diet and Gemfibrozil/Zetia as his wife has refused treatment of his HLD.    Last Lipids were not at goal:  Lab Results  Component Value Date   CHOL 198 10/21/2019   HDL 54 10/21/2019   LDLCALC 125 (H) 10/21/2019   TRIG 90 10/21/2019   CHOLHDL 3.7 10/21/2019     Also, the patient has history of PreDiabetes (A1c 5.8% /2011) and has had no symptoms of reactive hypoglycemia, diabetic polys, paresthesias or visual blurring.  Weight is down 15# over the last 8 months. Step-son feels he is eating "full" meals.  Last A1c was normal and at goal:  Lab Results  Component Value Date   HGBA1C 5.2 07/21/2019   Wt Readings from Last 3 Encounters:  09/26/20 122 lb 12.8 oz (55.7 kg)  03/15/20 132 lb (59.9 kg)  01/14/20 137 lb (62.1 kg)                                                            Further, the patient also has history of Vitamin D  Deficiency ("24" /2008) and supplements vitamin D without any suspected side-effects. Last vitamin D was at goal:   Lab Results  Component Value Date   VD25OH 66 11/11/2018     Current Outpatient Medications on File Prior to Visit  Medication Sig   calcium carbonate (OS-CAL) 600 MG tablet Takes 1 tab / day   Cholecalciferol (VITAMIN D) 2000 units CAPS Takes 1 cap 2 x / day   Ciclopirox 1 % shampoo 1 Application daily to scalp and leave for 3-5 min   clotrimazole (LOTRIMIN) 1 % cream Apply 1 application topically 2 (two) times daily.   ezetimibe (ZETIA) 10 MG tablet Take 1 tablet Daily for Cholesterol   gemfibrozil (LOPID) 600 MG tablet Take 1 tablet 2 x /day with Meals for Triglycerides (Blood Fats)   ketoconazole (NIZORAL) 2 % cream APPLY EXTERNALLY TO THE AFFECTED AREA  TWICE DAILY   latanoprost (XALATAN) 0.005 % ophthalmic solution Place 1 drop into both eyes at bedtime.    loratadine (CLARITIN) 10 MG tablet Take  daily.   Magnesium 250 MG TABS Takes 1 tab / day   Omega-3 Fatty Acids (FISH OIL) 1000 MG CAPS Take 1 capsule 2 (two) times daily.   triamcinolone ointment (KENALOG) 0.1 % Apply 1 application topically 2 (two) times daily. To face rash for 1-2 weeks then stop.   TURMERIC PO Take daily.      Allergies  Allergen Reactions   Benadryl [Diphenhydramine Hcl] Other (See Comments)    Hallucinations, itching, shaking     PMHx:   Past Medical History:  Diagnosis Date   Glaucoma    OSA (obstructive sleep apnea)    RLS (restless legs syndrome)    Vitamin D deficiency      Immunization History  Administered Date(s) Administered   Influenza Whole 11/23/2012   Influenza, High Dose 11/05/2016, 10/29/2017,  10/15/2018, 11/29/2019   PFIZER SARS-COV-2 Vacc 02/11/2020   Pneumococcal -13 01/12/2014   Pneumococcal -23 12/02/2008   Td 09/26/2010   Zoster, Live 02/25/2008     Past Surgical History:  Procedure Laterality Date   axillary Right 1986   biopsy of axillary  nodes   PILONIDAL CYST / SINUS EXCISION  1959    FHx:    Reviewed / unchanged  SHx:    Reviewed / unchanged   Systems Review:  Constitutional: Denies fever, chills, wt changes, headaches, insomnia, fatigue, night sweats, change in appetite. Eyes: Denies redness, blurred vision, diplopia, discharge, itchy, watery eyes.  ENT: Denies discharge, congestion, post nasal drip, epistaxis, sore throat, earache, hearing loss, dental pain, tinnitus, vertigo, sinus pain, snoring.  CV: Denies chest pain, palpitations, irregular heartbeat, syncope, dyspnea, diaphoresis, orthopnea, PND, claudication or edema. Respiratory: denies cough, dyspnea, DOE, pleurisy, hoarseness, laryngitis, wheezing.  Gastrointestinal: Denies dysphagia, odynophagia, heartburn, reflux, water brash, abdominal pain or cramps, nausea, vomiting, bloating, diarrhea, constipation, hematemesis, melena, hematochezia  or hemorrhoids. Genitourinary: Denies dysuria, frequency, urgency, nocturia, hesitancy, discharge, hematuria or flank pain. Musculoskeletal: Denies arthralgias, myalgias, stiffness, jt. swelling, pain, limping or strain/sprain.  Skin: Denies pruritus, rash, hives, warts, acne, eczema or change in skin lesion(s). Neuro: No weakness, tremor, incoordination, spasms, paresthesia or pain. Psychiatric: Denies confusion, memory loss or sensory loss. Endo: Denies change in weight, skin or hair change.  Heme/Lymph: No excessive bleeding, bruising or enlarged lymph nodes.  Physical Exam  BP 112/70   Pulse 71   Temp (!) 97.5 F (36.4 C)   Resp 16   Ht 5\' 8"  (1.727 m)   Wt 122 lb 12.8 oz (55.7 kg)   SpO2 97%   BMI 18.67 kg/m   Appears  well groomed  and in no distress.  Eyes: PERRLA, EOMs, conjunctiva no swelling or erythema. Sinuses: No frontal/maxillary tenderness ENT/Mouth: EAC's clear, TM's nl w/o erythema, bulging. Nares clear w/o erythema, swelling, exudates. Oropharynx clear without erythema or exudates. Oral  hygiene is good. Tongue normal, non obstructing. Hearing intact.  Neck: Supple. Thyroid not palpable. Car 2+/2+ without bruits, nodes or JVD. Chest: Respirations nl with BS clear & equal w/o rales, rhonchi, wheezing or stridor.  Cor: Heart sounds normal w/ regular rate and rhythm without sig. murmurs, gallops, clicks or rubs. Peripheral pulses normal and equal  without edema.  Abdomen: Soft & bowel sounds normal. Non-tender w/o guarding, rebound, hernias, masses or organomegaly.  Lymphatics: Unremarkable.  Musculoskeletal: Full ROM all peripheral extremities, joint stability, 5/5  strength and normal gait.  Skin: Warm, dry without exposed rashes, lesions or ecchymosis apparent.  Neuro: Cranial nerves intact, reflexes equal bilaterally. Sensory-motor testing grossly intact. Tendon reflexes grossly intact.  Pysch: Alert & oriented x 1 - 23.  Very limited short term recall.  Flat affect. Limited insight and judgment.   Assessment and Plan:  1. Essential hypertension  - Continue medication, monitor blood pressure at home.  - Continue DASH diet.  Reminder to go to the ER if any CP,  SOB, nausea, dizziness, severe HA, changes vision/speech.    - CBC with Differential/Platelet - COMPLETE METABOLIC PANEL WITH GFR - Magnesium - TSH  2. Hyperlipidemia, mixed  - Continue diet/meds, exercise,& lifestyle modifications.  - Continue monitor periodic cholesterol/liver & renal functions    - Lipid panel - TSH  3. Abnormal glucose  - Continue diet, exercise  - Lifestyle modifications.  - Monitor appropriate labs   - Hemoglobin A1c - Insulin, random  4. Vitamin D deficiency  - Continue supplementation    - VITAMIN D 25 Hydroxy  5. 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 Medication management  - CBC with Differential/Platelet - COMPLETE METABOLIC PANEL WITH GFR - Magnesium - Lipid panel - TSH - Hemoglobin A1c - Insulin, random - VITAMIN D 25  Hydroxy         Discussed  regular exercise, BP monitoring, weight control to achieve/maintain BMI less than 25 and discussed med and SE's. Recommended labs to assess and monitor clinical status with further disposition pending results of labs.  I discussed the assessment and treatment plan with the patient. The patient was provided an opportunity to ask questions and all were answered. The patient agreed with the plan and demonstrated an understanding of the instructions.  I provided over 30 minutes of exam, counseling, chart review and  complex critical decision making.        The patient was advised to call back or seek an in-person evaluation if the symptoms worsen or if the condition fails to improve as anticipated.   Marinus Maw, MD

## 2020-09-25 NOTE — Patient Instructions (Signed)

## 2020-09-26 ENCOUNTER — Encounter: Payer: Self-pay | Admitting: Internal Medicine

## 2020-09-26 ENCOUNTER — Other Ambulatory Visit: Payer: Self-pay

## 2020-09-26 ENCOUNTER — Ambulatory Visit (INDEPENDENT_AMBULATORY_CARE_PROVIDER_SITE_OTHER): Payer: PPO | Admitting: Internal Medicine

## 2020-09-26 VITALS — BP 112/70 | HR 71 | Temp 97.5°F | Resp 16 | Ht 68.0 in | Wt 122.8 lb

## 2020-09-26 DIAGNOSIS — E782 Mixed hyperlipidemia: Secondary | ICD-10-CM

## 2020-09-26 DIAGNOSIS — Z79899 Other long term (current) drug therapy: Secondary | ICD-10-CM | POA: Diagnosis not present

## 2020-09-26 DIAGNOSIS — R7309 Other abnormal glucose: Secondary | ICD-10-CM | POA: Diagnosis not present

## 2020-09-26 DIAGNOSIS — F0390 Unspecified dementia without behavioral disturbance: Secondary | ICD-10-CM | POA: Diagnosis not present

## 2020-09-26 DIAGNOSIS — F03B0 Unspecified dementia, moderate, without behavioral disturbance, psychotic disturbance, mood disturbance, and anxiety: Secondary | ICD-10-CM

## 2020-09-26 DIAGNOSIS — I10 Essential (primary) hypertension: Secondary | ICD-10-CM

## 2020-09-26 DIAGNOSIS — E559 Vitamin D deficiency, unspecified: Secondary | ICD-10-CM

## 2020-09-27 LAB — LIPID PANEL
Cholesterol: 210 mg/dL — ABNORMAL HIGH (ref <200)
HDL: 53 mg/dL (ref >=40)
LDL Cholesterol (Calc): 138 mg/dL (calc) — ABNORMAL HIGH
Non-HDL Cholesterol (Calc): 157 mg/dL (calc) — ABNORMAL HIGH (ref <130)
Total CHOL/HDL Ratio: 4 (calc) (ref <5.0)
Triglycerides: 91 mg/dL (ref <150)

## 2020-09-27 LAB — CBC WITH DIFFERENTIAL/PLATELET
Absolute Monocytes: 517 cells/uL (ref 200–950)
Basophils Absolute: 38 cells/uL (ref 0–200)
Basophils Relative: 0.6 %
Eosinophils Absolute: 189 cells/uL (ref 15–500)
Eosinophils Relative: 3 %
HCT: 41.2 % (ref 38.5–50.0)
Hemoglobin: 13.9 g/dL (ref 13.2–17.1)
Lymphs Abs: 2129 cells/uL (ref 850–3900)
MCH: 32.6 pg (ref 27.0–33.0)
MCHC: 33.7 g/dL (ref 32.0–36.0)
MCV: 96.7 fL (ref 80.0–100.0)
MPV: 10.9 fL (ref 7.5–12.5)
Monocytes Relative: 8.2 %
Neutro Abs: 3427 cells/uL (ref 1500–7800)
Neutrophils Relative %: 54.4 %
Platelets: 218 10*3/uL (ref 140–400)
RBC: 4.26 10*6/uL (ref 4.20–5.80)
RDW: 11.7 % (ref 11.0–15.0)
Total Lymphocyte: 33.8 %
WBC: 6.3 10*3/uL (ref 3.8–10.8)

## 2020-09-27 LAB — MAGNESIUM: Magnesium: 1.9 mg/dL (ref 1.5–2.5)

## 2020-09-27 LAB — COMPLETE METABOLIC PANEL WITH GFR
AG Ratio: 1.7 (calc) (ref 1.0–2.5)
ALT: 10 U/L (ref 9–46)
AST: 14 U/L (ref 10–35)
Albumin: 3.9 g/dL (ref 3.6–5.1)
Alkaline phosphatase (APISO): 46 U/L (ref 35–144)
BUN: 20 mg/dL (ref 7–25)
CO2: 33 mmol/L — ABNORMAL HIGH (ref 20–32)
Calcium: 9.1 mg/dL (ref 8.6–10.3)
Chloride: 99 mmol/L (ref 98–110)
Creat: 0.88 mg/dL (ref 0.70–1.28)
Globulin: 2.3 g/dL (calc) (ref 1.9–3.7)
Glucose, Bld: 116 mg/dL — ABNORMAL HIGH (ref 65–99)
Potassium: 4.1 mmol/L (ref 3.5–5.3)
Sodium: 135 mmol/L (ref 135–146)
Total Bilirubin: 0.7 mg/dL (ref 0.2–1.2)
Total Protein: 6.2 g/dL (ref 6.1–8.1)
eGFR: 87 mL/min/{1.73_m2} (ref >=60)

## 2020-09-27 LAB — HEMOGLOBIN A1C
Hgb A1c MFr Bld: 5.2 % of total Hgb (ref <5.7)
Mean Plasma Glucose: 103 mg/dL
eAG (mmol/L): 5.7 mmol/L

## 2020-09-27 LAB — INSULIN, RANDOM: Insulin: 18 u[IU]/mL

## 2020-09-27 LAB — VITAMIN D 25 HYDROXY (VIT D DEFICIENCY, FRACTURES): Vit D, 25-Hydroxy: 60 ng/mL (ref 30–100)

## 2020-09-27 LAB — TSH: TSH: 0.68 mIU/L (ref 0.40–4.50)

## 2020-09-27 NOTE — Progress Notes (Signed)
============================================================ ============================================================  -    Chol = 210 - elevated       (  Ideal or Goal is less than 180  !  )  - and   - Bad or Dangerous LDL Chol = 138  - Very elevated & increased risk for Heart attack, Stroke          (  Ideal or Goal is less than 70  !  )   - Recommend add treatment with a low dose of Crestor 10 mg   If you are agreeable to take it to Prevent Heart Attack, Stroke,                                                   Kidney Failure, or worse Vascular Dementia (( Please let me know to send in a RX  )) ============================================================ ============================================================  -  A1c is Normal - No Diabetes  - Great  ! If you are agreeable to take it to Prevent Heart Attack, Stroke,                                                       Kidney Failure or worse Vascular Dementia ============================================================ ============================================================  -  Vitamin D =- 60 - Great ! ============================================================ ============================================================

## 2020-10-03 ENCOUNTER — Other Ambulatory Visit: Payer: Self-pay | Admitting: Internal Medicine

## 2020-10-03 MED ORDER — MIRTAZAPINE 30 MG PO TABS: ORAL_TABLET | ORAL | 1 refills | Status: AC

## 2020-10-04 ENCOUNTER — Other Ambulatory Visit: Payer: Self-pay | Admitting: Internal Medicine

## 2020-10-04 MED ORDER — ROSUVASTATIN CALCIUM 10 MG PO TABS: ORAL_TABLET | ORAL | 3 refills | Status: DC

## 2020-10-17 ENCOUNTER — Other Ambulatory Visit: Payer: Self-pay | Admitting: Adult Health Nurse Practitioner

## 2020-10-17 DIAGNOSIS — N481 Balanitis: Secondary | ICD-10-CM

## 2020-10-26 ENCOUNTER — Other Ambulatory Visit: Payer: Self-pay

## 2020-10-26 ENCOUNTER — Ambulatory Visit (INDEPENDENT_AMBULATORY_CARE_PROVIDER_SITE_OTHER): Payer: PPO | Admitting: Internal Medicine

## 2020-10-26 VITALS — BP 120/74 | HR 63 | Temp 97.6°F | Resp 16 | Ht 68.0 in | Wt 124.8 lb

## 2020-10-26 DIAGNOSIS — E782 Mixed hyperlipidemia: Secondary | ICD-10-CM

## 2020-10-26 DIAGNOSIS — Z111 Encounter for screening for respiratory tuberculosis: Secondary | ICD-10-CM

## 2020-10-26 DIAGNOSIS — I10 Essential (primary) hypertension: Secondary | ICD-10-CM

## 2020-10-26 DIAGNOSIS — F0390 Unspecified dementia without behavioral disturbance: Secondary | ICD-10-CM | POA: Diagnosis not present

## 2020-10-26 DIAGNOSIS — Z1152 Encounter for screening for COVID-19: Secondary | ICD-10-CM | POA: Diagnosis not present

## 2020-10-26 DIAGNOSIS — F03B0 Unspecified dementia, moderate, without behavioral disturbance, psychotic disturbance, mood disturbance, and anxiety: Secondary | ICD-10-CM

## 2020-10-26 LAB — POC COVID19 BINAXNOW: SARS Coronavirus 2 Ag: NEGATIVE

## 2020-10-26 NOTE — Progress Notes (Signed)
    Future Appointments  Date Time Provider Department Center  10/26/2020 11:30 AM Lucky Cowboy, MD [facility] None  01/01/2021 10:00 AM Lucky Cowboy, MD [facility] None  03/15/2021 10:00 AM Revonda Humphrey, NP [facility] None   History of Present Illness:     Patient is a very nice 80 yo  MWM w/HTN, HLD, Pre-Diabetes and Vitamin D Deficiency who has moderate Vascular Dementia progressively worse over the last year. Patient's wife is in poor health & is physically unable to provide care for him anymore.  Patient has very poor short term recall and very limited insight or comprehension .   Medications    ezetimibe 10 MG tablet, Take 1 tablet Daily    gemfibrozil  600 MG tablet, Take 1 tablet 2 x /day with Meals   rosuvastatin 10 MG tablet, Take 1 tablet Daily   loratadine  10 MG tablet, Take daily.   OS-CAL 600 MG tablet, Takes 1 tab / day   VITAMIN D 2000 units, Takes 1 cap 2 x / day   Ciclopirox 1 % shampoo, 1 Application daily to scalp and leave for 3-5 min   clotrimazole (LOTRIMIN) 1 % cream, Apply 2  times daily.   ketoconazole (NIZORAL) 2 % cream, APPLY  to AFFECTED AREA TWICE DAILY   latanoprost (XALATAN) 0.005 % ophthalmic solution, Place 1 drop into both eyes at bedtime.    Magnesium 250 MG TABS, Takes 1 tab /day   mirtazapine 30 MG tablet, Take 1/2 to 1 tablet 1 hour before Bedtime    Omega-3 FISH OIL 1000 MG CAPS, Take 1 capsule 2 times daily.   triamcinolone ointment 0.1 %, Apply 1 application topically 2 times daily. To face rash for 1-2 weeks then stop.   TURMERIC , Take daily.  Problem list He has Essential hypertension; Hyperlipidemia, mixed; Abnormal glucose; Vitamin D deficiency; Medication management; Glaucoma; OSA (obstructive sleep apnea); BMI 23.0-23.9, adult; Chronic obstructive pulmonary disease (HCC); Former smoker; Moderate dementia with behavioral disturbance (HCC); Aortic atherosclerosis (HCC); HSV-2 (herpes simplex virus 2) infection; and Balanitis on  their problem list.   Observations/Objective:  BP 120/74   Pulse 63   Temp 97.6 F (36.4 C)   Resp 16   Ht 5\' 8"  (1.727 m)   Wt 124 lb 12.8 oz (56.6 kg)   SpO2 95%   BMI 18.98 kg/m   HEENT - WNL. Neck - supple.  Chest - Clear equal BS. Cor - Nl HS. RRR w/o sig MGR. PP 1(+). No edema. MS- FROM w/o deformities.  Gait shuffling. No tremor.  Neuro -  No focal neuro signs. Poor short term recall. Oriented x 1 - person only. No insight /poor judgement.   Assessment and Plan:  1. 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 Essential hypertension   3. Hyperlipidemia, mixed   4. Screening-pulmonary TB  - PPD  5. Encounter for screening for COVID-19  - POC COVID-19    Follow Up Instructions:       I discussed the assessment and treatment plan with the patient's  advocate - (Step son - ). They were provided an opportunity to ask questions and all were answered. They agreed with the plan and demonstrated an understanding of the instructions.      They were advised to call back or seek an in-person evaluation if the symptoms worsen or if the condition fails to improve as anticipated.   Cydney Ok, MD

## 2020-10-29 ENCOUNTER — Encounter: Payer: Self-pay | Admitting: Internal Medicine

## 2020-10-30 ENCOUNTER — Other Ambulatory Visit: Payer: Self-pay

## 2020-10-30 ENCOUNTER — Ambulatory Visit: Payer: PPO

## 2020-10-30 DIAGNOSIS — Z111 Encounter for screening for respiratory tuberculosis: Secondary | ICD-10-CM

## 2020-10-30 LAB — TB SKIN TEST
Induration: 0 mm
TB Skin Test: NEGATIVE

## 2020-10-30 NOTE — Progress Notes (Signed)
Patient presents to the office for a nurse visit to have TB reading done. Results were Negative with an Induration of 0. FL2 forms completed and given to the POA.

## 2020-11-05 ENCOUNTER — Other Ambulatory Visit: Payer: Self-pay

## 2020-11-05 ENCOUNTER — Emergency Department (HOSPITAL_COMMUNITY): Payer: PPO

## 2020-11-05 ENCOUNTER — Emergency Department (HOSPITAL_COMMUNITY)
Admission: EM | Admit: 2020-11-05 | Discharge: 2020-11-05 | Disposition: A | Discharge status: Home / Self Care | Payer: PPO | Attending: Emergency Medicine | Admitting: Emergency Medicine

## 2020-11-05 DIAGNOSIS — M549 Dorsalgia, unspecified: Secondary | ICD-10-CM | POA: Diagnosis not present

## 2020-11-05 DIAGNOSIS — S0990XA Unspecified injury of head, initial encounter: Secondary | ICD-10-CM | POA: Insufficient documentation

## 2020-11-05 DIAGNOSIS — M545 Low back pain, unspecified: Secondary | ICD-10-CM | POA: Insufficient documentation

## 2020-11-05 DIAGNOSIS — Z87891 Personal history of nicotine dependence: Secondary | ICD-10-CM | POA: Diagnosis not present

## 2020-11-05 DIAGNOSIS — J449 Chronic obstructive pulmonary disease, unspecified: Secondary | ICD-10-CM | POA: Insufficient documentation

## 2020-11-05 DIAGNOSIS — Z79899 Other long term (current) drug therapy: Secondary | ICD-10-CM | POA: Insufficient documentation

## 2020-11-05 DIAGNOSIS — I10 Essential (primary) hypertension: Secondary | ICD-10-CM | POA: Insufficient documentation

## 2020-11-05 DIAGNOSIS — S50312A Abrasion of left elbow, initial encounter: Secondary | ICD-10-CM | POA: Insufficient documentation

## 2020-11-05 DIAGNOSIS — R55 Syncope and collapse: Secondary | ICD-10-CM | POA: Diagnosis not present

## 2020-11-05 DIAGNOSIS — W19XXXA Unspecified fall, initial encounter: Secondary | ICD-10-CM | POA: Diagnosis not present

## 2020-11-05 DIAGNOSIS — R404 Transient alteration of awareness: Secondary | ICD-10-CM | POA: Diagnosis not present

## 2020-11-05 DIAGNOSIS — S59902A Unspecified injury of left elbow, initial encounter: Secondary | ICD-10-CM | POA: Diagnosis present

## 2020-11-05 DIAGNOSIS — F0390 Unspecified dementia without behavioral disturbance: Secondary | ICD-10-CM | POA: Diagnosis not present

## 2020-11-05 LAB — CBC
HCT: 38.2 % — ABNORMAL LOW (ref 39.0–52.0)
Hemoglobin: 13.2 g/dL (ref 13.0–17.0)
MCH: 33.4 pg (ref 26.0–34.0)
MCHC: 34.6 g/dL (ref 30.0–36.0)
MCV: 96.7 fL (ref 80.0–100.0)
Platelets: 171 10*3/uL (ref 150–400)
RBC: 3.95 MIL/uL — ABNORMAL LOW (ref 4.22–5.81)
RDW: 12.4 % (ref 11.5–15.5)
WBC: 10 10*3/uL (ref 4.0–10.5)
nRBC: 0 % (ref 0.0–0.2)

## 2020-11-05 LAB — BASIC METABOLIC PANEL
Anion gap: 11 (ref 5–15)
BUN: 25 mg/dL — ABNORMAL HIGH (ref 8–23)
CO2: 25 mmol/L (ref 22–32)
Calcium: 9.4 mg/dL (ref 8.9–10.3)
Chloride: 99 mmol/L (ref 98–111)
Creatinine, Ser: 0.76 mg/dL (ref 0.61–1.24)
GFR, Estimated: >60 mL/min (ref >60)
Glucose, Bld: 87 mg/dL (ref 70–99)
Potassium: 3.9 mmol/L (ref 3.5–5.1)
Sodium: 135 mmol/L (ref 135–145)

## 2020-11-05 NOTE — ED Notes (Signed)
Unable to obtain signature at time of discharge due to no signature pad available in hallway. Pt's son at bedside verbalized understanding and will transport pt back to [facility] unit.

## 2020-11-05 NOTE — ED Triage Notes (Signed)
Per EMS, patient from [facility], witnessed fall x45 minutes ago. C/o back pain. Hx dementia. Unknown baseline per staff. Staff states patient is a new patient to the facility.

## 2020-11-05 NOTE — Discharge Instructions (Signed)
CT scan of the brain and lumbar spine did not show acute injury, fracture, or brain bleed.  ECG and BMP/CBC basic labs normal and at baseline levels for patient.  Patient's son present in ED to take him back to facility following discharge.

## 2020-11-05 NOTE — ED Provider Notes (Signed)
[facility]-EMERGENCY DEPT Provider Note   CSN: [id number] Arrival date & time: 11/05/20  1608     History Chief Complaint  Patient presents with   Cameron Mcclain is a 80 y.o. male w/ hx of vascular dementia presenting from [facility] memory care with a witnessed fall.  This got approximate 45 minutes before his arrival.  Patient was reportedly complaining of back pain at that time, though he does not complain of that here.  He is pleasantly demented to my exam but cannot recall anything that happened today.  He has no acute complaints.  He arrives in a C-spine collar per EMS.  The patient is new to their facility.  Patient seen at bedside reports that he was placed in his facility within the past week.  Patient has had a rapidly declining cognitive function over the past year.  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   Aortic atherosclerosis (HCC) by CXR in 2018 04/19/2019   Moderate dementia with behavioral disturbance 11/11/2018   Former smoker 01/12/2018   Encounter for Medicare annual wellness exam 03/19/2017   Chronic obstructive pulmonary disease (HCC) 09/10/2016   Glaucoma 10/19/2014   OSA (obstructive sleep apnea) 10/19/2014   BMI 23.0-23.9, adult 10/19/2014   Medication management 07/01/2013   Essential hypertension 12/29/2012   Hyperlipidemia, mixed 12/29/2012   Abnormal glucose 12/29/2012   Vitamin D deficiency 12/29/2012    Past Surgical History:  Procedure Laterality Date   axillary Right 1986   biopsy of axillary nodes   PILONIDAL CYST / SINUS EXCISION  1959       Family History  Problem Relation Age of Onset   Hypertension Mother    Diabetes Mother    Hypertension  Father    Cancer Father     Social History   Tobacco Use   Smoking status: Former    Types: Cigarettes    Quit date: 05/10/2009    Years since quitting: 11.4   Smokeless tobacco: Never  Substance Use Topics   Alcohol use: Yes    Alcohol/week: 0.0 standard drinks   Drug use: No    Home Medications Prior to Admission medications   Medication Sig Start Date End Date Taking? Authorizing Provider  calcium carbonate (OS-CAL) 600 MG tablet Takes 1 tab / day 06/18/17   Lucky Cowboy, MD  Cholecalciferol (VITAMIN D) 2000 units CAPS Takes 1 cap 2 x / day 06/18/17   Lucky Cowboy, MD  Ciclopirox 1 % shampoo 1 Application daily to scalp and leave for 3-5 min 12/20/19   Judd Gaudier, NP  clotrimazole (LOTRIMIN) 1 % cream Apply 1 application topically 2 (two) times daily. 12/28/19   Judd Gaudier, NP  ezetimibe (ZETIA) 10 MG tablet Take 1 tablet Daily for Cholesterol 08/17/18   Lucky Cowboy, MD  gemfibrozil (LOPID) 600 MG tablet Take 1 tablet 2 x /day with Meals for Triglycerides (Blood Fats) 11/26/18   Lucky Cowboy, MD  ketoconazole (NIZORAL) 2 % cream APPLY TOPICALLY TO THE AFFECTED AREA TWICE DAILY 10/17/20   Revonda Humphrey, NP  latanoprost (XALATAN) 0.005 % ophthalmic solution Place 1 drop into both eyes at bedtime.  12/23/12  [provider]  loratadine (CLARITIN) 10 MG tablet Take 10 mg by mouth daily.    [provider]  Magnesium 250 MG TABS Takes 1 tab / day 06/18/17   Lucky Cowboy, MD  mirtazapine (REMERON) 30 MG tablet Take 1/2 to 1 tablet 1 hour before Bedtime for Appetite & Sleep 10/03/20   Lucky Cowboy, MD  Omega-3 Fatty Acids (FISH OIL) 1000 MG CAPS Take 1 capsule (1,000 mg total) by mouth 2 (two) times daily. 12/29/12   Lucky Cowboy, MD  rosuvastatin (CRESTOR) 10 MG tablet Take 1 tablet Daily for Cholesterol 10/04/20   Lucky Cowboy, MD  triamcinolone ointment (KENALOG) 0.1 % Apply 1 application topically 2 (two) times daily. To face rash  for 1-2 weeks then stop. 01/14/20   Elder Negus, NP  TURMERIC PO Take by mouth daily.    [provider]    Allergies    Benadryl [diphenhydramine hcl]  Review of Systems   Review of Systems  Unable to perform ROS: Dementia (level 5 caveat)   Physical Exam Updated Vital Signs BP 140/85 (BP Location: Left Arm)   Pulse 91   Temp 98.2 F (36.8 C) (Oral)   Resp 18   Ht 5\' 8"  (1.727 m)   Wt 59 kg   SpO2 94%   BMI 19.77 kg/m   Physical Exam Constitutional:      General: He is not in acute distress. HENT:     Head: Normocephalic and atraumatic.  Eyes:     Conjunctiva/sclera: Conjunctivae normal.     Pupils: Pupils are equal, round, and reactive to light.  Neck:     Comments: Mild L spine midline tenderness No C or T spine tenderness Cardiovascular:     Rate and Rhythm: Normal rate and regular rhythm.  Pulmonary:     Effort: Pulmonary effort is normal. No respiratory distress.  Abdominal:     General: There is no distension.     Tenderness: There is no abdominal tenderness.  Musculoskeletal:     Cervical back: Normal range of motion and neck supple.     Comments: Abrasion to left elbow Full ROM of the hips and extremities, including elbow, with no tenderness  Skin:    General: Skin is warm and dry.  Neurological:     General: No focal deficit present.     Mental Status: He is alert. Mental status is at baseline.    ED Results / Procedures / Treatments   Labs (all labs ordered are listed, but only abnormal results are displayed) Labs Reviewed  BASIC METABOLIC PANEL - Abnormal; Notable for the following components:      Result Value   BUN 25 (*)    All other components within normal limits  CBC - Abnormal; Notable for the following components:   RBC 3.95 (*)    HCT 38.2 (*)    All other components within normal limits    EKG EKG Interpretation  Date/Time:  Sunday November 05 2020 17:24:24 EDT Ventricular Rate:  92 PR Interval:  208 QRS  Duration: 66 QT Interval:  378 QTC Calculation: 467 R Axis:   -52 Text Interpretation: Motion artifact, Sinus rhythm No sig change from prior tracing 2015, no STEMI Confirmed by 03[birth year]1993 [phone]) on 11/05/2020 5:31:12 PM  Radiology CT HEAD WO CONTRAST (01/05/2021)  Result Date: 11/05/2020 CLINICAL DATA:  Head trauma.  Fall. EXAM: CT HEAD WITHOUT CONTRAST TECHNIQUE: Contiguous axial images were obtained from the base of the skull through the vertex  without intravenous contrast. COMPARISON:  06/06/2011 FINDINGS: Brain: There is no evidence of an acute infarct, intracranial hemorrhage, mass, or midline shift. Mildly increased extra-axial fluid over both cerebral convexities compared to the prior CT is favored to reflect progressive, moderate cerebral atrophy although very small coexistent subdural hygromas are not excluded. Asymmetrically advanced volume loss is noted in the mesial right temporal lobe. Hypodensities in the cerebral white matter bilaterally are new/progressive and nonspecific but compatible with mild chronic small vessel ischemic disease. Vascular: Calcified atherosclerosis at the skull base. No hyperdense vessel. Skull: No fracture or suspicious osseous lesion. Sinuses/Orbits: Visualized paranasal sinuses and mastoid air cells are clear. Unremarkable orbits. Other: Right parietal scalp swelling. IMPRESSION: 1. No evidence of acute intracranial abnormality. 2. Right parietal scalp swelling. 3. Progressive cerebral atrophy and chronic small vessel ischemic disease. Electronically Signed   By: Sebastian Ache M.D.   On: 11/05/2020 18:56   CT Lumbar Spine Wo Contrast  Result Date: 11/05/2020 CLINICAL DATA:  Fall.  Back pain. EXAM: CT LUMBAR SPINE WITHOUT CONTRAST TECHNIQUE: Multidetector CT imaging of the lumbar spine was performed without intravenous contrast administration. Multiplanar CT image reconstructions were also generated. COMPARISON:  None. FINDINGS: Segmentation: 5 lumbar type  vertebrae. Alignment: Mild lumbar levoscoliosis. Trace retrolisthesis of L2 on L3. 3 mm anterolisthesis of L4 on L5. Vertebrae: No acute fracture or suspicious osseous lesion. Diffuse osteopenia. Bridging osteophytes across the anterior and superior aspects of the right SI joint. Paraspinal and other soft tissues: Abdominal aortic atherosclerosis without aneurysm. Disc levels: Moderate disc space narrowing and vacuum disc at L4-5 with mild narrowing at other levels. Anterolisthesis with bulging uncovered disc, endplate spurring, ligamentum flavum hypertrophy, and severe right greater than left facet arthrosis at L4-5 result in moderate spinal stenosis, bilateral lateral recess stenosis, and severe right and moderate left neural foraminal stenosis with potential right L4 and bilateral L5 nerve root impingement. No evidence of significant spinal stenosis elsewhere. Severe right neural foraminal stenosis at L3-4 due to disc bulging, endplate spurring, and facet hypertrophy with potential right L3 nerve root impingement. IMPRESSION: 1. No acute osseous abnormality identified. 2. Severe facet arthrosis at L4-5 with grade 1 anterolisthesis, moderate spinal stenosis, and severe right and moderate left neural foraminal stenosis. 3. Severe right neural foraminal stenosis at L3-4. 4. Aortic Atherosclerosis (ICD10-I70.0). Electronically Signed   By: Sebastian Ache M.D.   On: 11/05/2020 18:49    Procedures Procedures   Medications Ordered in ED Medications - No data to display  ED Course  I have reviewed the triage vital signs and the nursing notes.  Pertinent labs & imaging results that were available during my care of the patient were reviewed by me and considered in my medical decision making (see chart for details).  Patient is here with a witnessed fall from a memory care facility.  He cannot recall the events.  I have ordered basic labs, EKG, is a CT scan of the head.  He has small abrasion to left elbow, but  no significant tenderness to suggest a fracture.  Full range of motion at the hips.  He does have some vague lower back pain, I ordered a CT scan of the lumbar spine as well.  Otherwise he is comfortable appearing.  He has no C spine tenderness or neck pain - C spine collar cleared on arrival.  Reviewed the patient CT imaging, with no acute traumatic injuries or fractures.  Supplemental history provided by the patient's son at bedside.  Reviewed and interpreted  the patient's labs are at baseline levels, no evidence of significant dehydration or acute anemia or infection.  I also reviewed his EKG and interpreted which shows a sinus rhythm with some motion artifact, but no acute ischemic findings.  Low suspicion for anemia, ACS, PE, or other medical emergency at this time, including CVA.  Okay for discharge.  His son will be taking him back to the memory facility.  Clinical Course as of 11/05/20 2043  Wynelle Link Nov 05, 2020  1909  IMPRESSION: 1. No evidence of acute intracranial abnormality. 2. Right parietal scalp swelling. 3. Progressive cerebral atrophy and chronic small vessel ischemic disease. [MT]    Clinical Course User Index [MT] Terald Sleeper, MD    Final Clinical Impression(s) / ED Diagnoses Final diagnoses:  Fall, initial encounter    Rx / DC Orders ED Discharge Orders     None        Bretta Fees, Kermit Balo, MD 11/05/20 2043

## 2020-11-08 DIAGNOSIS — M6281 Muscle weakness (generalized): Secondary | ICD-10-CM | POA: Diagnosis not present

## 2020-11-08 DIAGNOSIS — H409 Unspecified glaucoma: Secondary | ICD-10-CM | POA: Diagnosis not present

## 2020-11-08 DIAGNOSIS — E559 Vitamin D deficiency, unspecified: Secondary | ICD-10-CM | POA: Diagnosis not present

## 2020-11-08 DIAGNOSIS — I10 Essential (primary) hypertension: Secondary | ICD-10-CM | POA: Diagnosis not present

## 2020-11-08 DIAGNOSIS — E782 Mixed hyperlipidemia: Secondary | ICD-10-CM | POA: Diagnosis not present

## 2020-11-08 DIAGNOSIS — F0150 Vascular dementia without behavioral disturbance: Secondary | ICD-10-CM | POA: Diagnosis not present

## 2020-11-10 ENCOUNTER — Emergency Department (HOSPITAL_BASED_OUTPATIENT_CLINIC_OR_DEPARTMENT_OTHER): Payer: PPO

## 2020-11-10 ENCOUNTER — Other Ambulatory Visit: Payer: Self-pay

## 2020-11-10 ENCOUNTER — Emergency Department (HOSPITAL_BASED_OUTPATIENT_CLINIC_OR_DEPARTMENT_OTHER)
Admission: EM | Admit: 2020-11-10 | Discharge: 2020-11-11 | Disposition: A | Discharge status: Home / Self Care | Payer: PPO | Attending: Emergency Medicine | Admitting: Emergency Medicine

## 2020-11-10 ENCOUNTER — Emergency Department (HOSPITAL_BASED_OUTPATIENT_CLINIC_OR_DEPARTMENT_OTHER): Payer: PPO | Admitting: Radiology

## 2020-11-10 DIAGNOSIS — W06XXXA Fall from bed, initial encounter: Secondary | ICD-10-CM | POA: Insufficient documentation

## 2020-11-10 DIAGNOSIS — M5032 Other cervical disc degeneration, mid-cervical region, unspecified level: Secondary | ICD-10-CM | POA: Diagnosis not present

## 2020-11-10 DIAGNOSIS — M4802 Spinal stenosis, cervical region: Secondary | ICD-10-CM | POA: Diagnosis not present

## 2020-11-10 DIAGNOSIS — S0001XA Abrasion of scalp, initial encounter: Secondary | ICD-10-CM | POA: Diagnosis not present

## 2020-11-10 DIAGNOSIS — S0083XA Contusion of other part of head, initial encounter: Secondary | ICD-10-CM | POA: Diagnosis not present

## 2020-11-10 DIAGNOSIS — R9431 Abnormal electrocardiogram [ECG] [EKG]: Secondary | ICD-10-CM | POA: Diagnosis not present

## 2020-11-10 DIAGNOSIS — Z043 Encounter for examination and observation following other accident: Secondary | ICD-10-CM | POA: Diagnosis not present

## 2020-11-10 DIAGNOSIS — R220 Localized swelling, mass and lump, head: Secondary | ICD-10-CM | POA: Diagnosis not present

## 2020-11-10 DIAGNOSIS — Z87891 Personal history of nicotine dependence: Secondary | ICD-10-CM | POA: Insufficient documentation

## 2020-11-10 DIAGNOSIS — F0390 Unspecified dementia without behavioral disturbance: Secondary | ICD-10-CM | POA: Diagnosis not present

## 2020-11-10 DIAGNOSIS — I6200 Nontraumatic subdural hemorrhage, unspecified: Secondary | ICD-10-CM | POA: Diagnosis not present

## 2020-11-10 DIAGNOSIS — J449 Chronic obstructive pulmonary disease, unspecified: Secondary | ICD-10-CM | POA: Diagnosis not present

## 2020-11-10 DIAGNOSIS — W19XXXA Unspecified fall, initial encounter: Secondary | ICD-10-CM | POA: Diagnosis not present

## 2020-11-10 DIAGNOSIS — G9608 Other cranial cerebrospinal fluid leak: Secondary | ICD-10-CM

## 2020-11-10 DIAGNOSIS — S0990XA Unspecified injury of head, initial encounter: Secondary | ICD-10-CM | POA: Diagnosis present

## 2020-11-10 DIAGNOSIS — F0150 Vascular dementia without behavioral disturbance: Secondary | ICD-10-CM | POA: Diagnosis not present

## 2020-11-10 LAB — CBC WITH DIFFERENTIAL/PLATELET
Abs Immature Granulocytes: 0.02 10*3/uL (ref 0.00–0.07)
Basophils Absolute: 0.1 10*3/uL (ref 0.0–0.1)
Basophils Relative: 1 %
Eosinophils Absolute: 0.2 10*3/uL (ref 0.0–0.5)
Eosinophils Relative: 3 %
HCT: 36.8 % — ABNORMAL LOW (ref 39.0–52.0)
Hemoglobin: 12.5 g/dL — ABNORMAL LOW (ref 13.0–17.0)
Immature Granulocytes: 0 %
Lymphocytes Relative: 17 %
Lymphs Abs: 1.3 10*3/uL (ref 0.7–4.0)
MCH: 32.3 pg (ref 26.0–34.0)
MCHC: 34 g/dL (ref 30.0–36.0)
MCV: 95.1 fL (ref 80.0–100.0)
Monocytes Absolute: 0.7 10*3/uL (ref 0.1–1.0)
Monocytes Relative: 9 %
Neutro Abs: 5.6 10*3/uL (ref 1.7–7.7)
Neutrophils Relative %: 70 %
Platelets: 209 10*3/uL (ref 150–400)
RBC: 3.87 MIL/uL — ABNORMAL LOW (ref 4.22–5.81)
RDW: 12.4 % (ref 11.5–15.5)
WBC: 7.9 10*3/uL (ref 4.0–10.5)
nRBC: 0 % (ref 0.0–0.2)

## 2020-11-10 LAB — COMPREHENSIVE METABOLIC PANEL
ALT: 27 U/L (ref 0–44)
AST: 38 U/L (ref 15–41)
Albumin: 3.7 g/dL (ref 3.5–5.0)
Alkaline Phosphatase: 40 U/L (ref 38–126)
Anion gap: 9 (ref 5–15)
BUN: 27 mg/dL — ABNORMAL HIGH (ref 8–23)
CO2: 27 mmol/L (ref 22–32)
Calcium: 8.7 mg/dL — ABNORMAL LOW (ref 8.9–10.3)
Chloride: 100 mmol/L (ref 98–111)
Creatinine, Ser: 0.7 mg/dL (ref 0.61–1.24)
GFR, Estimated: >60 mL/min (ref >60)
Glucose, Bld: 85 mg/dL (ref 70–99)
Potassium: 3.9 mmol/L (ref 3.5–5.1)
Sodium: 136 mmol/L (ref 135–145)
Total Bilirubin: 1 mg/dL (ref 0.3–1.2)
Total Protein: 6.1 g/dL — ABNORMAL LOW (ref 6.5–8.1)

## 2020-11-10 NOTE — ED Notes (Signed)
Patient stood with assistance of Freida Busman, RN,  Hubbard NT, and myself.  Patient able to stand with assistance but unsteady and unable to ambulate.

## 2020-11-10 NOTE — ED Triage Notes (Signed)
Per EMS, patient from [facility] had an unwitnessed fall from his bed. Unknown LOC. Not on blood thinners.

## 2020-11-10 NOTE — ED Notes (Signed)
Stood patient at bedside.   Able to bear weight but unable to take step.  Unsteady on feet

## 2020-11-10 NOTE — Discharge Instructions (Addendum)
The head CT showed a Subdural Hygroma. We spoke with the Neurosurgeon who recommended follow up in 2-3 weeks for repeat head CT outpatient.  He is very unsteady on his feet. Would encourage wheelchair use over the next few days until he feels better and more up to ambulating with assistance

## 2020-11-10 NOTE — ED Notes (Addendum)
Patient transported to CT and xray 

## 2020-11-10 NOTE — ED Provider Notes (Signed)
[facility] EMERGENCY DEPT Provider Note   CSN: [id number] Arrival date & time: 11/10/20  1151    History Chief Complaint  Patient presents with   Fall    Unwitnessed fall from bed per facility staff. Unknown LOC. Patient mainly nonverbal at baseline. Patient not on blood thinners.    Argyle Shamrock is a 80 y.o. male with past history significant for vascular dementia who presents for evaluation of unwitnessed fall.  Patient unable to provide history due to dementia therefore level caveat 5 applies  From [facility] place>>  Placed almost 2 weeks ago, has not been sleeping well due to the adjustment. Vascular dementia at baseline mentation per facility. Facility does every 30 min checks on patient. He did not sleep at all last night. Went to bed around 9 AM today after being up for 24 hours. Went to check on patient and found laying next to bed. Cannot follow conversation, at baseline, Will mumble in conversation. Very unstable with ambulation at baseline, can only take a few steps with help. Facility states tries to use wheelchair primarily due to frequent falling episodes and severe unsteadiness on feet at baseline  Patient had not complained about pain, weakness or dizziness prior to falls  Collateral from Cyd[place] Ok son, states patient has not slept well for a long period of time. Has declined rapidly over the last year. Does not remember family, cannot have conversations at baseline. Unstable on feet.   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   Aortic atherosclerosis (HCC) by CXR in 2018 04/19/2019   Moderate dementia with behavioral disturbance 11/11/2018   Former smoker 01/12/2018   Encounter for Medicare  annual wellness exam 03/19/2017   Chronic obstructive pulmonary disease (HCC) 09/10/2016   Glaucoma 10/19/2014   OSA (obstructive sleep apnea) 10/19/2014   BMI 23.0-23.9, adult 10/19/2014   Medication management 07/01/2013   Essential hypertension 12/29/2012   Hyperlipidemia, mixed 12/29/2012   Abnormal glucose 12/29/2012   Vitamin D deficiency 12/29/2012    Past Surgical History:  Procedure Laterality Date   axillary Right 1986   biopsy of axillary nodes   PILONIDAL CYST / SINUS EXCISION  1959       Family History  Problem Relation Age of Onset   Hypertension Mother    Diabetes Mother    Hypertension Father    Cancer Father     Social History   Tobacco Use   Smoking status: Former    Types: Cigarettes    Quit date: 05/10/2009    Years since quitting: 11.5   Smokeless tobacco: Never  Substance Use Topics   Alcohol use: Yes    Alcohol/week: 0.0 standard drinks   Drug use: No    Home Medications Prior to Admission medications   Medication Sig Start Date End Date Taking? Authorizing Provider  calcium carbonate (OS-CAL) 600 MG tablet Takes 1 tab / day 06/18/17   Lucky Cowboy, MD  Cholecalciferol (VITAMIN D) 2000 units CAPS Takes 1 cap 2 x / day 06/18/17   Lucky Cowboy, MD  Ciclopirox 1 % shampoo 1 Application daily to scalp and leave for 3-5 min 12/20/19   Judd Gaudier, NP  clotrimazole (LOTRIMIN) 1 %  cream Apply 1 application topically 2 (two) times daily. 12/28/19   Judd Gaudier, NP  ezetimibe (ZETIA) 10 MG tablet Take 1 tablet Daily for Cholesterol 08/17/18   Lucky Cowboy, MD  gemfibrozil (LOPID) 600 MG tablet Take 1 tablet 2 x /day with Meals for Triglycerides (Blood Fats) 11/26/18   Lucky Cowboy, MD  ketoconazole (NIZORAL) 2 % cream APPLY TOPICALLY TO THE AFFECTED AREA TWICE DAILY 10/17/20   Revonda Humphrey, NP  latanoprost (XALATAN) 0.005 % ophthalmic solution Place 1 drop into both eyes at bedtime.  12/23/12   [provider]  loratadine  (CLARITIN) 10 MG tablet Take 10 mg by mouth daily.    [provider]  Magnesium 250 MG TABS Takes 1 tab / day 06/18/17   Lucky Cowboy, MD  mirtazapine (REMERON) 30 MG tablet Take 1/2 to 1 tablet 1 hour before Bedtime for Appetite & Sleep 10/03/20   Lucky Cowboy, MD  Omega-3 Fatty Acids (FISH OIL) 1000 MG CAPS Take 1 capsule (1,000 mg total) by mouth 2 (two) times daily. 12/29/12   Lucky Cowboy, MD  rosuvastatin (CRESTOR) 10 MG tablet Take 1 tablet Daily for Cholesterol 10/04/20   Lucky Cowboy, MD  triamcinolone ointment (KENALOG) 0.1 % Apply 1 application topically 2 (two) times daily. To face rash for 1-2 weeks then stop. 01/14/20   Elder Negus, NP  TURMERIC PO Take by mouth daily.    [provider]    Allergies    Benadryl [diphenhydramine hcl]  Review of Systems   Review of Systems  Unable to perform ROS: Dementia  All other systems reviewed and are negative.  Physical Exam Updated Vital Signs BP 132/75 (BP Location: Left Arm)   Pulse 68   Temp 98 F (36.7 C) (Oral)   Resp 12   Ht 5\' 8"  (1.727 m)   Wt 59 kg   SpO2 95%   BMI 19.78 kg/m   Physical Exam Vitals and nursing note reviewed.  Constitutional:      General: He is not in acute distress.    Appearance: He is well-developed. He is not ill-appearing, toxic-appearing or diaphoretic.     Comments: Sleeping, arousal to voice however keeps eyes closed  HENT:     Head: Normocephalic. Abrasion present. No raccoon eyes or Battle's sign.      Comments: 2 cm scalp hematoma with overlying abrasion right forehead    Ears:     Comments: Cerumen BL ear canals, without complete obstruction. No hemotympanum     Nose: Nose normal.     Mouth/Throat:     Mouth: Mucous membranes are moist.  Eyes:     Pupils: Pupils are equal, round, and reactive to light.  Cardiovascular:     Rate and Rhythm: Normal rate and regular rhythm.     Pulses: Normal pulses.     Heart sounds: Normal heart sounds.   Pulmonary:     Effort: Pulmonary effort is normal. No respiratory distress.     Breath sounds: Normal breath sounds.     Comments: Clear Bl Abdominal:     General: Bowel sounds are normal. There is no distension.     Palpations: Abdomen is soft.     Tenderness: There is no abdominal tenderness. There is no guarding or rebound.  Musculoskeletal:        General: Swelling and signs of injury present. No deformity. Normal range of motion.     Cervical back: Normal range of motion and neck supple.  Comments: Spontaneously moves extremities. No midline C/T/L tenderness, step offs. No shortening or rotation of legs.  Wiggles toes without difficulty.  Moves bilateral shoulders overhead.  He has swelling and skin tear to his left elbow however full range of motion  Skin:    General: Skin is warm and dry.     Findings: Bruising present.     Comments: Skin tear approximately 2 cm round to left olecranon.  No lacerations to suture.  Abrasion to right forehead with underlying scalp hematoma.  Healing skin tears to bilateral knees, right elbow  Neurological:     Comments: Per facility at baseline.  Mumbles to answer questions.  No obvious facial droop.  Keeps eyes closed during conversation.  When attempting to open eyes there is resistance.  Pupils equal reactive to light.  Will intermittently grip hands  Difficult to complete full exam due to underlying vascular dementia    ED Results / Procedures / Treatments   Labs (all labs ordered are listed, but only abnormal results are displayed) Labs Reviewed  CBC WITH DIFFERENTIAL/PLATELET - Abnormal; Notable for the following components:      Result Value   RBC 3.87 (*)    Hemoglobin 12.5 (*)    HCT 36.8 (*)    All other components within normal limits  COMPREHENSIVE METABOLIC PANEL - Abnormal; Notable for the following components:   BUN 27 (*)    Calcium 8.7 (*)    Total Protein 6.1 (*)    All other components within normal limits     EKG EKG Interpretation  Date/Time:  Friday November 10 2020 12:25:04 EDT Ventricular Rate:  66 PR Interval:  194 QRS Duration: 66 QT Interval:  402 QTC Calculation: 421 R Axis:   -70 Text Interpretation: Normal sinus rhythm Left axis deviation Low voltage QRS Cannot rule out Inferior infarct , age undetermined Possible Anterolateral infarct , age undetermined Abnormal ECG Confirmed by Blane Ohara [phone]) on 11/10/2020 3:33:02 PM  Radiology DG Chest 2 View  Result Date: 11/10/2020 CLINICAL DATA:  Unwitnessed fall EXAM: CHEST - 2 VIEW COMPARISON:  03/26/2016 chest radiograph. FINDINGS: Stable cardiomediastinal silhouette with normal heart size. No pneumothorax. No pleural effusion. Hyperinflated lungs. No pulmonary edema. No acute consolidative airspace disease. No displaced fractures. IMPRESSION: No acute cardiopulmonary disease. Hyperinflated lungs, suggesting COPD. Electronically Signed   By: Delbert Phenix M.D.   On: 11/10/2020 13:38   DG Pelvis 1-2 Views  Result Date: 11/10/2020 CLINICAL DATA:  Unwitnessed fall EXAM: PELVIS - 1-2 VIEW COMPARISON:  None. FINDINGS: No pelvic fracture or diastasis. No evidence of hip dislocation on this single frontal view. Mild degenerative changes in the visualized lower lumbar spine. No aggressive appearing focal osseous lesions. Moderate rectal stool. IMPRESSION: No pelvic fracture. Electronically Signed   By: Delbert Phenix M.D.   On: 11/10/2020 13:40   DG Elbow Complete Left  Result Date: 11/10/2020 CLINICAL DATA:  Unwitnessed fall EXAM: LEFT ELBOW - COMPLETE 3+ VIEW COMPARISON:  None. FINDINGS: There is no evidence of fracture, dislocation, or joint effusion. There is no evidence of arthropathy or other focal bone abnormality. Soft tissues are unremarkable. IMPRESSION: Negative. Electronically Signed   By: Delbert Phenix M.D.   On: 11/10/2020 13:39   CT Head Wo Contrast  Result Date: 11/10/2020 CLINICAL DATA:  Unwitnessed fall from bed.  Discoloration over right eye. EXAM: CT HEAD WITHOUT CONTRAST CT CERVICAL SPINE WITHOUT CONTRAST TECHNIQUE: Multidetector CT imaging of the head and cervical spine was performed following the standard  protocol without intravenous contrast. Multiplanar CT image reconstructions of the cervical spine were also generated. COMPARISON:  CT head 11/05/2020 FINDINGS: CT HEAD FINDINGS Brain: Interval mild increase in the amount of CSF density extra-axial fluid over both cerebral convexities, measuring up to 7 mm in thickness, consistent with bilateral subdural hygromas. No mass effect. No acute intracranial hemorrhage, acute infarct, mass, or midline shift. Generalized parenchymal volume loss with asymmetrically advanced volume loss in the medial right temporal lobe. Vascular: No hyperdense vessel or unexpected calcification. Skull: Normal. Negative for fracture or focal lesion. Sinuses/Orbits: The paranasal sinuses and right mastoid air cells are clear. Effusions are noted in a few left mastoid air cells. Other: Soft tissue swelling overlying the right forehead. CT CERVICAL SPINE FINDINGS Alignment: Normal. Skull base and vertebrae: No acute fracture. No primary bone lesion or focal pathologic process. Soft tissues and spinal canal: No prevertebral fluid or swelling. No visible canal hematoma. Disc levels: Moderate osteoarthritis at the atlantal dental joint. Multilevel moderate degenerative disc disease with loss of disc space height and anterior posterior osteophytes throughout the cervical spine. Multilevel hypertrophic bilateral facet arthropathy most severe at the right C2-C3 facet joint. Uncovertebral spurring common in combination with facet arthropathy, results in mild neural foraminal narrowing at the bilateral C2-C3, bilateral C4-C5, and left C6-C7 neural foramina and moderate narrowing at the right C5-C6 neural foramen. Upper chest: Negative. Other: None. IMPRESSION: CT head: 1. Interval development of subdural  hygromas overlying the cerebral convexities. No mass effect. 2. No acute intracranial hemorrhage or infarct. No calvarial fracture. 3. Soft tissue swelling of the right forehead. CT cervical spine: 1. No fracture or traumatic malalignment of the cervical spine. 2. Multilevel degenerative disc disease as described in the body of the report. Electronically Signed   By: Sherron Ales M.D.   On: 11/10/2020 14:07   CT Cervical Spine Wo Contrast  Result Date: 11/10/2020 CLINICAL DATA:  Unwitnessed fall from bed. Discoloration over right eye. EXAM: CT HEAD WITHOUT CONTRAST CT CERVICAL SPINE WITHOUT CONTRAST TECHNIQUE: Multidetector CT imaging of the head and cervical spine was performed following the standard protocol without intravenous contrast. Multiplanar CT image reconstructions of the cervical spine were also generated. COMPARISON:  CT head 11/05/2020 FINDINGS: CT HEAD FINDINGS Brain: Interval mild increase in the amount of CSF density extra-axial fluid over both cerebral convexities, measuring up to 7 mm in thickness, consistent with bilateral subdural hygromas. No mass effect. No acute intracranial hemorrhage, acute infarct, mass, or midline shift. Generalized parenchymal volume loss with asymmetrically advanced volume loss in the medial right temporal lobe. Vascular: No hyperdense vessel or unexpected calcification. Skull: Normal. Negative for fracture or focal lesion. Sinuses/Orbits: The paranasal sinuses and right mastoid air cells are clear. Effusions are noted in a few left mastoid air cells. Other: Soft tissue swelling overlying the right forehead. CT CERVICAL SPINE FINDINGS Alignment: Normal. Skull base and vertebrae: No acute fracture. No primary bone lesion or focal pathologic process. Soft tissues and spinal canal: No prevertebral fluid or swelling. No visible canal hematoma. Disc levels: Moderate osteoarthritis at the atlantal dental joint. Multilevel moderate degenerative disc disease with loss of  disc space height and anterior posterior osteophytes throughout the cervical spine. Multilevel hypertrophic bilateral facet arthropathy most severe at the right C2-C3 facet joint. Uncovertebral spurring common in combination with facet arthropathy, results in mild neural foraminal narrowing at the bilateral C2-C3, bilateral C4-C5, and left C6-C7 neural foramina and moderate narrowing at the right C5-C6 neural foramen. Upper chest: Negative. Other:  None. IMPRESSION: CT head: 1. Interval development of subdural hygromas overlying the cerebral convexities. No mass effect. 2. No acute intracranial hemorrhage or infarct. No calvarial fracture. 3. Soft tissue swelling of the right forehead. CT cervical spine: 1. No fracture or traumatic malalignment of the cervical spine. 2. Multilevel degenerative disc disease as described in the body of the report. Electronically Signed   By: Sherron Ales M.D.   On: 11/10/2020 14:07    Procedures Procedures   Medications Ordered in ED Medications - No data to display  ED Course  I have reviewed the triage vital signs and the nursing notes.  Pertinent labs & imaging results that were available during my care of the patient were reviewed by me and considered in my medical decision making (see chart for details).  Here for evaluation of unwitnessed fall at nursing facility.  Has frequent falls per facility.  Unfortunately has had decline with vascular dementia over the last year per family.  Patient recently placed in this facility about a week and a half 2 weeks ago.  Per facility patients presentation today is similar to what he has had since placement.  He mumbles for conversation however unable to follow questions or commands at baseline.  Per facility patient very unstable on feet they have been attempting to use a wheelchair due to his instability.  Unfortunately not sleeping at facility at night and is sleeping during the day.  He has not slept over the last 24 hours,  did fall asleep at 9:00 this morning, facility check on him 30 minutes later when he was on the floor.  Subsequently brought here for evaluation.  We will plan on checking labs, EKG, imaging and reassess he has skin tear which was bandaged however does not require suturing.  Labs and imaging personally reviewed and interpreted:  CBC without leukocytosis, Hgb similar to prior CMP BUN 27 similar to prior  EKG without acute abnormality  CT head subdural hygromas. Will consult with Neurosurgery CT cervical without acute abnormality DG left elbow without acute abnormality DG pelvis without acute abnormality DG chest without acute abnormality   Patient reassessed. States he has no pain. Significantly more alert than on arrival.  Able to stand and walk a few steps per nursing however very unstable on feet. Discussed with son and nursing facility. States this is at his baseline. Per nursing at facility they states they can get him more assistance with ambulation and are comfortable accepting him in return.   Discussed this plan with patient son. He is agreeable for FU outpatient and return for new or worsening symptoms  Clinical Course as of 11/10/20 1731  Fri Nov 10, 2020  1516 Dr. Jake Samples with Neurosurgery. Rec FU outpatient for repeat head CT in 2-3 weeks. Does not needs admission at this time [BH]    Clinical Course User Index [BH] Brentlee Delage A, PA-C    The patient has been appropriately medically screened and/or stabilized in the ED. I have low suspicion for any other emergent medical condition which would require further screening, evaluation or treatment in the ED or require inpatient management.  Patient is hemodynamically stable and in no acute distress.  Patient able to ambulate in department prior to ED.  Evaluation does not show acute pathology that would require ongoing or additional emergent interventions while in the emergency department or further inpatient treatment.  I  have discussed the diagnosis with the patient and answered all questions.  Pain is been managed while  in the emergency department and patient has no further complaints prior to discharge.  Patient is comfortable with plan discussed in room and is stable for discharge at this time.  I have discussed strict return precautions for returning to the emergency department.  Patient was encouraged to follow-up with PCP/specialist refer to at discharge.   Patient seen and evaluated by attending who is in agreement with above treatment, plan and disposition. MDM Rules/Calculators/A&P                            Final Clinical Impression(s) / ED Diagnoses Final diagnoses:  Fall, initial encounter  Subdural hygroma  Vascular dementia without behavioral disturbance, psychotic disturbance, mood disturbance, or anxiety, unspecified dementia severity [facility])    Rx / DC Orders ED Discharge Orders     None        Victorya Hillman A, PA-C 11/10/20 1731    Blane Ohara, MD 11/11/20 2354

## 2020-11-11 DIAGNOSIS — R531 Weakness: Secondary | ICD-10-CM | POA: Diagnosis not present

## 2020-11-11 DIAGNOSIS — Z7401 Bed confinement status: Secondary | ICD-10-CM | POA: Diagnosis not present

## 2020-11-11 NOTE — ED Notes (Signed)
Pt verbalizes understanding of discharge instructions. Opportunity for questioning and answers were provided. Armand removed by staff, pt discharged from ED to facility. PTAR given papers for d/c.

## 2020-11-12 ENCOUNTER — Other Ambulatory Visit: Payer: Self-pay | Admitting: Internal Medicine

## 2020-11-15 DIAGNOSIS — E559 Vitamin D deficiency, unspecified: Secondary | ICD-10-CM | POA: Diagnosis not present

## 2020-11-15 DIAGNOSIS — I10 Essential (primary) hypertension: Secondary | ICD-10-CM | POA: Diagnosis not present

## 2020-11-15 DIAGNOSIS — R296 Repeated falls: Secondary | ICD-10-CM | POA: Diagnosis not present

## 2020-11-15 DIAGNOSIS — F0150 Vascular dementia without behavioral disturbance: Secondary | ICD-10-CM | POA: Diagnosis not present

## 2020-11-15 DIAGNOSIS — E782 Mixed hyperlipidemia: Secondary | ICD-10-CM | POA: Diagnosis not present

## 2020-11-15 DIAGNOSIS — H409 Unspecified glaucoma: Secondary | ICD-10-CM | POA: Diagnosis not present

## 2020-11-16 DIAGNOSIS — I10 Essential (primary) hypertension: Secondary | ICD-10-CM | POA: Diagnosis not present

## 2020-11-16 DIAGNOSIS — E559 Vitamin D deficiency, unspecified: Secondary | ICD-10-CM | POA: Diagnosis not present

## 2020-11-16 DIAGNOSIS — M6281 Muscle weakness (generalized): Secondary | ICD-10-CM | POA: Diagnosis not present

## 2020-11-16 DIAGNOSIS — E782 Mixed hyperlipidemia: Secondary | ICD-10-CM | POA: Diagnosis not present

## 2020-11-16 DIAGNOSIS — E119 Type 2 diabetes mellitus without complications: Secondary | ICD-10-CM | POA: Diagnosis not present

## 2020-11-22 DIAGNOSIS — R32 Unspecified urinary incontinence: Secondary | ICD-10-CM | POA: Diagnosis not present

## 2020-11-22 DIAGNOSIS — F0150 Vascular dementia without behavioral disturbance: Secondary | ICD-10-CM | POA: Diagnosis not present

## 2020-11-22 DIAGNOSIS — E782 Mixed hyperlipidemia: Secondary | ICD-10-CM | POA: Diagnosis not present

## 2020-11-22 DIAGNOSIS — E559 Vitamin D deficiency, unspecified: Secondary | ICD-10-CM | POA: Diagnosis not present

## 2020-11-22 DIAGNOSIS — H409 Unspecified glaucoma: Secondary | ICD-10-CM | POA: Diagnosis not present

## 2020-11-22 DIAGNOSIS — I10 Essential (primary) hypertension: Secondary | ICD-10-CM | POA: Diagnosis not present

## 2020-11-22 DIAGNOSIS — Z9181 History of falling: Secondary | ICD-10-CM | POA: Diagnosis not present

## 2020-12-06 DIAGNOSIS — M6281 Muscle weakness (generalized): Secondary | ICD-10-CM | POA: Diagnosis not present

## 2020-12-06 DIAGNOSIS — I10 Essential (primary) hypertension: Secondary | ICD-10-CM | POA: Diagnosis not present

## 2020-12-06 DIAGNOSIS — E782 Mixed hyperlipidemia: Secondary | ICD-10-CM | POA: Diagnosis not present

## 2020-12-06 DIAGNOSIS — F0150 Vascular dementia without behavioral disturbance: Secondary | ICD-10-CM | POA: Diagnosis not present

## 2020-12-06 DIAGNOSIS — H409 Unspecified glaucoma: Secondary | ICD-10-CM | POA: Diagnosis not present

## 2020-12-06 DIAGNOSIS — E559 Vitamin D deficiency, unspecified: Secondary | ICD-10-CM | POA: Diagnosis not present

## 2020-12-13 DIAGNOSIS — F0150 Vascular dementia without behavioral disturbance: Secondary | ICD-10-CM | POA: Diagnosis not present

## 2020-12-13 DIAGNOSIS — H409 Unspecified glaucoma: Secondary | ICD-10-CM | POA: Diagnosis not present

## 2020-12-13 DIAGNOSIS — E782 Mixed hyperlipidemia: Secondary | ICD-10-CM | POA: Diagnosis not present

## 2020-12-13 DIAGNOSIS — I10 Essential (primary) hypertension: Secondary | ICD-10-CM | POA: Diagnosis not present

## 2020-12-13 DIAGNOSIS — M6281 Muscle weakness (generalized): Secondary | ICD-10-CM | POA: Diagnosis not present

## 2020-12-13 DIAGNOSIS — E559 Vitamin D deficiency, unspecified: Secondary | ICD-10-CM | POA: Diagnosis not present

## 2020-12-13 DIAGNOSIS — R609 Edema, unspecified: Secondary | ICD-10-CM | POA: Diagnosis not present

## 2020-12-17 ENCOUNTER — Emergency Department (HOSPITAL_BASED_OUTPATIENT_CLINIC_OR_DEPARTMENT_OTHER): Payer: PPO

## 2020-12-17 ENCOUNTER — Encounter (HOSPITAL_BASED_OUTPATIENT_CLINIC_OR_DEPARTMENT_OTHER): Payer: Self-pay

## 2020-12-17 ENCOUNTER — Inpatient Hospital Stay (HOSPITAL_BASED_OUTPATIENT_CLINIC_OR_DEPARTMENT_OTHER)
Admission: EM | Admit: 2020-12-17 | Discharge: 2020-12-20 | DRG: 083 | Disposition: A | Discharge status: Hospice | Payer: PPO | Attending: Internal Medicine | Admitting: Internal Medicine

## 2020-12-17 ENCOUNTER — Other Ambulatory Visit: Payer: Self-pay

## 2020-12-17 DIAGNOSIS — F03B18 Unspecified dementia, moderate, with other behavioral disturbance: Secondary | ICD-10-CM

## 2020-12-17 DIAGNOSIS — R7989 Other specified abnormal findings of blood chemistry: Secondary | ICD-10-CM

## 2020-12-17 DIAGNOSIS — E782 Mixed hyperlipidemia: Secondary | ICD-10-CM | POA: Diagnosis not present

## 2020-12-17 DIAGNOSIS — Z7189 Other specified counseling: Secondary | ICD-10-CM | POA: Diagnosis not present

## 2020-12-17 DIAGNOSIS — S065X0A Traumatic subdural hemorrhage without loss of consciousness, initial encounter: Secondary | ICD-10-CM | POA: Diagnosis not present

## 2020-12-17 DIAGNOSIS — H409 Unspecified glaucoma: Secondary | ICD-10-CM | POA: Diagnosis not present

## 2020-12-17 DIAGNOSIS — S0990XA Unspecified injury of head, initial encounter: Secondary | ICD-10-CM | POA: Diagnosis not present

## 2020-12-17 DIAGNOSIS — Z515 Encounter for palliative care: Secondary | ICD-10-CM | POA: Diagnosis not present

## 2020-12-17 DIAGNOSIS — Z66 Do not resuscitate: Secondary | ICD-10-CM | POA: Diagnosis present

## 2020-12-17 DIAGNOSIS — Z8249 Family history of ischemic heart disease and other diseases of the circulatory system: Secondary | ICD-10-CM

## 2020-12-17 DIAGNOSIS — Z79899 Other long term (current) drug therapy: Secondary | ICD-10-CM | POA: Diagnosis not present

## 2020-12-17 DIAGNOSIS — R296 Repeated falls: Secondary | ICD-10-CM | POA: Diagnosis present

## 2020-12-17 DIAGNOSIS — I6202 Nontraumatic subacute subdural hemorrhage: Secondary | ICD-10-CM | POA: Diagnosis not present

## 2020-12-17 DIAGNOSIS — E559 Vitamin D deficiency, unspecified: Secondary | ICD-10-CM | POA: Diagnosis not present

## 2020-12-17 DIAGNOSIS — J449 Chronic obstructive pulmonary disease, unspecified: Secondary | ICD-10-CM | POA: Diagnosis present

## 2020-12-17 DIAGNOSIS — G309 Alzheimer's disease, unspecified: Secondary | ICD-10-CM | POA: Diagnosis present

## 2020-12-17 DIAGNOSIS — Z781 Physical restraint status: Secondary | ICD-10-CM

## 2020-12-17 DIAGNOSIS — Z043 Encounter for examination and observation following other accident: Secondary | ICD-10-CM | POA: Diagnosis not present

## 2020-12-17 DIAGNOSIS — Z87891 Personal history of nicotine dependence: Secondary | ICD-10-CM

## 2020-12-17 DIAGNOSIS — Z23 Encounter for immunization: Secondary | ICD-10-CM | POA: Diagnosis not present

## 2020-12-17 DIAGNOSIS — R404 Transient alteration of awareness: Secondary | ICD-10-CM | POA: Diagnosis not present

## 2020-12-17 DIAGNOSIS — R791 Abnormal coagulation profile: Secondary | ICD-10-CM | POA: Diagnosis not present

## 2020-12-17 DIAGNOSIS — Z20822 Contact with and (suspected) exposure to covid-19: Secondary | ICD-10-CM | POA: Diagnosis not present

## 2020-12-17 DIAGNOSIS — Z833 Family history of diabetes mellitus: Secondary | ICD-10-CM

## 2020-12-17 DIAGNOSIS — W01198A Fall on same level from slipping, tripping and stumbling with subsequent striking against other object, initial encounter: Secondary | ICD-10-CM | POA: Diagnosis present

## 2020-12-17 DIAGNOSIS — F02B18 Dementia in other diseases classified elsewhere, moderate, with other behavioral disturbance: Secondary | ICD-10-CM | POA: Diagnosis not present

## 2020-12-17 DIAGNOSIS — M7989 Other specified soft tissue disorders: Secondary | ICD-10-CM

## 2020-12-17 DIAGNOSIS — W19XXXA Unspecified fall, initial encounter: Secondary | ICD-10-CM

## 2020-12-17 DIAGNOSIS — I10 Essential (primary) hypertension: Secondary | ICD-10-CM | POA: Diagnosis not present

## 2020-12-17 DIAGNOSIS — M47812 Spondylosis without myelopathy or radiculopathy, cervical region: Secondary | ICD-10-CM | POA: Diagnosis not present

## 2020-12-17 DIAGNOSIS — I6203 Nontraumatic chronic subdural hemorrhage: Secondary | ICD-10-CM | POA: Diagnosis present

## 2020-12-17 DIAGNOSIS — S065XAA Traumatic subdural hemorrhage with loss of consciousness status unknown, initial encounter: Principal | ICD-10-CM | POA: Diagnosis present

## 2020-12-17 DIAGNOSIS — Z9181 History of falling: Secondary | ICD-10-CM | POA: Diagnosis not present

## 2020-12-17 DIAGNOSIS — R609 Edema, unspecified: Secondary | ICD-10-CM | POA: Diagnosis not present

## 2020-12-17 DIAGNOSIS — M4802 Spinal stenosis, cervical region: Secondary | ICD-10-CM | POA: Diagnosis not present

## 2020-12-17 LAB — COMPREHENSIVE METABOLIC PANEL
ALT: 17 U/L (ref 0–44)
AST: 28 U/L (ref 15–41)
Albumin: 3.8 g/dL (ref 3.5–5.0)
Alkaline Phosphatase: 61 U/L (ref 38–126)
Anion gap: 8 (ref 5–15)
BUN: 26 mg/dL — ABNORMAL HIGH (ref 8–23)
CO2: 26 mmol/L (ref 22–32)
Calcium: 9 mg/dL (ref 8.9–10.3)
Chloride: 101 mmol/L (ref 98–111)
Creatinine, Ser: 0.89 mg/dL (ref 0.61–1.24)
GFR, Estimated: >60 mL/min (ref >60)
Glucose, Bld: 93 mg/dL (ref 70–99)
Potassium: 4.4 mmol/L (ref 3.5–5.1)
Sodium: 135 mmol/L (ref 135–145)
Total Bilirubin: 0.6 mg/dL (ref 0.3–1.2)
Total Protein: 6.6 g/dL (ref 6.5–8.1)

## 2020-12-17 LAB — CBC
HCT: 33.5 % — ABNORMAL LOW (ref 39.0–52.0)
Hemoglobin: 11.5 g/dL — ABNORMAL LOW (ref 13.0–17.0)
MCH: 33.1 pg (ref 26.0–34.0)
MCHC: 34.3 g/dL (ref 30.0–36.0)
MCV: 96.5 fL (ref 80.0–100.0)
Platelets: 225 10*3/uL (ref 150–400)
RBC: 3.47 MIL/uL — ABNORMAL LOW (ref 4.22–5.81)
RDW: 12.6 % (ref 11.5–15.5)
WBC: 7.4 10*3/uL (ref 4.0–10.5)
nRBC: 0 % (ref 0.0–0.2)

## 2020-12-17 LAB — PROTIME-INR
INR: 1 (ref 0.8–1.2)
Prothrombin Time: 12.9 seconds (ref 11.4–15.2)

## 2020-12-17 LAB — RESP PANEL BY RT-PCR (FLU A&B, COVID) ARPGX2
Influenza A by PCR: NEGATIVE
Influenza B by PCR: NEGATIVE
SARS Coronavirus 2 by RT PCR: NEGATIVE

## 2020-12-17 LAB — D-DIMER, QUANTITATIVE: D-Dimer, Quant: 1.14 ug/mL-FEU — ABNORMAL HIGH (ref 0.00–0.50)

## 2020-12-17 MED ORDER — LEVETIRACETAM IN NACL 500 MG/100ML IV SOLN
500.0000 mg | Freq: Once | INTRAVENOUS | Status: AC
  Administered 2020-12-17: 500 mg via INTRAVENOUS
  Filled 2020-12-17: qty 100

## 2020-12-17 MED ORDER — TETANUS-DIPHTH-ACELL PERTUSSIS 5-2.5-18.5 LF-MCG/0.5 IM SUSY
0.5000 mL | PREFILLED_SYRINGE | Freq: Once | INTRAMUSCULAR | Status: AC
  Administered 2020-12-17: 0.5 mL via INTRAMUSCULAR
  Filled 2020-12-17: qty 0.5

## 2020-12-17 MED ORDER — LEVETIRACETAM 500 MG PO TABS
500.0000 mg | ORAL_TABLET | Freq: Two times a day (BID) | ORAL | Status: DC
  Filled 2020-12-17: qty 1

## 2020-12-17 NOTE — ED Provider Notes (Signed)
[facility] EMERGENCY DEPT Provider Note   CSN: [id number] Arrival date & time: 12/17/20  1947     History Chief Complaint  Patient presents with   Cameron Mcclain Cameron Mcclain is a 80 y.o. male.   Fall   80 year old male with medical history below to include dementia presenting to the emergency department after a fall.  Patient was brought in by EMS from [facility] after 2 falls today.  The POA for the patient initially refused medical transport after the patient's initial fall.  Unclear per EMS if the fall was witnessed.  The second fall was witnessed by staff.  Level 5 caveat due to patient dementia.  Additional history obtained from the patient's son, Cameron Mcclain at [phone].  The patient has had several falls over the past few weeks, 1 fall 5 weeks ago, 1 fall 3 weeks ago and 2 falls today.  On chart review, on 11/10/2020, the patient had interval development of subdural hygromas overlying cerebral convexities with no mass-effect with no intracranial hemorrhage.  He is at his baseline neurologic status per his son.  No new neurologic deficits.  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   Aortic atherosclerosis (HCC) by CXR in 2018 04/19/2019   Moderate dementia with behavioral disturbance 11/11/2018   Former smoker 01/12/2018   Encounter for Medicare annual wellness exam 03/19/2017   Chronic obstructive pulmonary disease (HCC) 09/10/2016   Glaucoma 10/19/2014   OSA (obstructive sleep apnea) 10/19/2014   BMI 23.0-23.9, adult 10/19/2014   Medication management 07/01/2013   Essential hypertension 12/29/2012   Hyperlipidemia, mixed 12/29/2012   Abnormal glucose 12/29/2012   Vitamin D  deficiency 12/29/2012    Past Surgical History:  Procedure Laterality Date   axillary Right 1986   biopsy of axillary nodes   PILONIDAL CYST / SINUS EXCISION  1959       Family History  Problem Relation Age of Onset   Hypertension Mother    Diabetes Mother    Hypertension Father    Cancer Father     Social History   Tobacco Use   Smoking status: Former    Types: Cigarettes    Quit date: 05/10/2009    Years since quitting: 11.6   Smokeless tobacco: Never  Substance Use Topics   Alcohol use: Yes    Alcohol/week: 0.0 standard drinks   Drug use: No    Home Medications Prior to Admission medications   Medication Sig Start Date End Date Taking? Authorizing Provider  calcium carbonate (OS-CAL) 600 MG tablet Takes 1 tab / day 06/18/17   Lucky Cowboy, MD  Cholecalciferol (VITAMIN D) 2000 units CAPS Takes 1 cap 2 x / day 06/18/17   Lucky Cowboy, MD  Ciclopirox 1 % shampoo 1 Application daily to scalp and leave for 3-5 min 12/20/19   Judd Gaudier, NP  clotrimazole (LOTRIMIN) 1 % cream Apply 1 application topically 2 (two) times daily. 12/28/19   Judd Gaudier, NP  ezetimibe (ZETIA) 10 MG tablet Take 1 tablet Daily for Cholesterol 08/17/18   Lucky Cowboy, MD  gemfibrozil (LOPID) 600 MG tablet Take 1 tablet 2 x /day with Meals for Triglycerides (Blood Fats) 11/26/18  Lucky Cowboy, MD  ketoconazole (NIZORAL) 2 % cream APPLY TOPICALLY TO THE AFFECTED AREA TWICE DAILY 10/17/20   Revonda Humphrey, NP  latanoprost (XALATAN) 0.005 % ophthalmic solution Place 1 drop into both eyes at bedtime.  12/23/12   [provider]  loratadine (CLARITIN) 10 MG tablet Take 10 mg by mouth daily.    [provider]  Magnesium 250 MG TABS Takes 1 tab / day 06/18/17   Lucky Cowboy, MD  mirtazapine (REMERON) 30 MG tablet Take 1/2 to 1 tablet 1 hour before Bedtime for Appetite & Sleep 10/03/20   Lucky Cowboy, MD  Omega-3 Fatty Acids (FISH OIL) 1000 MG CAPS Take 1 capsule  (1,000 mg total) by mouth 2 (two) times daily. 12/29/12   Lucky Cowboy, MD  rosuvastatin (CRESTOR) 10 MG tablet Take 1 tablet Daily for Cholesterol 10/04/20   Lucky Cowboy, MD  triamcinolone ointment (KENALOG) 0.1 % Apply 1 application topically 2 (two) times daily. To face rash for 1-2 weeks then stop. 01/14/20   Elder Negus, NP  TURMERIC PO Take by mouth daily.    [provider]    Allergies    Benadryl [diphenhydramine hcl]  Review of Systems   Review of Systems  Unable to perform ROS: Dementia   Physical Exam Updated Vital Signs BP 126/71 (BP Location: Left Arm)   Pulse 75   Temp (!) 96.7 F (35.9 C) (Axillary)   Resp 16   SpO2 95%   Physical Exam Vitals and nursing note reviewed.  Constitutional:      Appearance: He is well-developed.     Comments: GCS 14, ABC intact, pleasantly demented, following commands  HENT:     Head: Normocephalic.     Comments: Right-sided forehead abrasion noted Eyes:     Conjunctiva/sclera: Conjunctivae normal.  Neck:     Comments: No midline tenderness to palpation of the cervical spine. ROM intact. Cardiovascular:     Rate and Rhythm: Normal rate and regular rhythm.     Heart sounds: No murmur heard. Pulmonary:     Effort: Pulmonary effort is normal. No respiratory distress.     Breath sounds: Normal breath sounds.  Chest:     Comments: Chest wall stable and non-tender to AP and lateral compression. Clavicles stable and non-tender to AP compression Abdominal:     Palpations: Abdomen is soft.     Tenderness: There is no abdominal tenderness.     Comments: Pelvis stable to lateral compression.  Musculoskeletal:     Cervical back: Neck supple.     Right lower leg: Edema present.     Comments: No midline tenderness to palpation of the thoracic or lumbar spine. Extremities atraumatic with intact ROM.   Skin:    General: Skin is warm and dry.  Neurological:     Mental Status: He is alert.     Comments: CN II-XII  grossly intact.  No facial droop.  Moving all four extremities spontaneously and to command antigravity and sensation grossly intact.    ED Results / Procedures / Treatments   Labs (all labs ordered are listed, but only abnormal results are displayed) Labs Reviewed  COMPREHENSIVE METABOLIC PANEL - Abnormal; Notable for the following components:      Result Value   BUN 26 (*)    All other components within normal limits  CBC - Abnormal; Notable for the following components:   RBC 3.47 (*)    Hemoglobin 11.5 (*)    HCT 33.5 (*)  All other components within normal limits  D-DIMER, QUANTITATIVE (NOT AT [facility]) - Abnormal; Notable for the following components:   D-Dimer, Quant 1.14 (*)    All other components within normal limits  RESP PANEL BY RT-PCR (FLU A&B, COVID) ARPGX2  PROTIME-INR    EKG None  Radiology DG Elbow Complete Right  Result Date: 12/17/2020 CLINICAL DATA:  Unwitnessed fall. EXAM: RIGHT ELBOW - COMPLETE 3+ VIEW COMPARISON:  None. FINDINGS: There is no evidence of fracture, dislocation, or joint effusion. Mild degenerative changes spurring. Tiny olecranon spur soft tissues are unremarkable. IMPRESSION: No fracture or subluxation of the right elbow. Mild degenerative change. Electronically Signed   By: Narda Rutherford M.D.   On: 12/17/2020 21:06   CT HEAD WO CONTRAST  Result Date: 12/17/2020 CLINICAL DATA:  Initial evaluation for acute head trauma, fall. EXAM: CT HEAD WITHOUT CONTRAST CT CERVICAL SPINE WITHOUT CONTRAST TECHNIQUE: Multidetector CT imaging of the head and cervical spine was performed following the standard protocol without intravenous contrast. Multiplanar CT image reconstructions of the cervical spine were also generated. COMPARISON:  Prior head CT from 11/10/2020. FINDINGS: CT HEAD FINDINGS Brain: There has been interval development of a fairly large subdural hematoma on the left, largely isodense to adjacent brain and subacute in appearance, although a  small amount of more hyperdense blood products is seen within this collection. Collection measures up to approximately 3 cm in diameter. Associated mass effect on the subjacent left cerebral hemisphere with up to 1 cm of left-to-right shift. Additionally, there is a smaller largely subacute appearing extra-axial collection overlying the right cerebral convexity measuring up to 7 mm in thickness (series 4, image 32), likely subdural as well. No significant mass effect. There is partial effacement of the left lateral ventricle with asymmetric dilatation of the right lateral ventricle, slightly increased from previous exam, which could reflect a degree of mild trapping. Underlying atrophy with chronic small vessel ischemic disease. No acute large vessel territory infarct. No mass lesion. Vascular: No hyperdense vessel. Calcified atherosclerosis present at skull base. Skull: Small soft tissue contusions about the forehead, left greater than right. Calvarium intact. Sinuses/Orbits: Globes and orbital soft tissues demonstrate no acute finding. Paranasal sinuses are clear. No mastoid effusion. Other: None. CT CERVICAL SPINE FINDINGS Alignment: Straightening of the normal cervical lordosis. Trace anterolisthesis of C4 on C5, C5 on C6, and C6 on C7, likely chronic and degenerative. Skull base and vertebrae: Skull base intact. Normal C1-2 articulations are preserved. Dens is intact. Vertebral body height maintained. No acute fracture. Soft tissues and spinal canal: Paraspinous soft tissues demonstrate no acute finding. No abnormal prevertebral edema. Spinal canal demonstrates no acute finding. Vascular calcifications about the carotid bifurcations. Disc levels: Mild-to-moderate multilevel cervical spondylosis with mild spinal stenosis at C5-6. Upper chest: Visualized upper chest demonstrates no acute finding. Partially visualized lung apices are grossly clear. Other: None. IMPRESSION: CT BRAIN: 1. Interval development of a  fairly large left subdural hematoma measuring up to 3 cm, largely subacute in nature. Mass effect on the subjacent left cerebral hemisphere with up to 1 cm left-to-right shift. Mild dilatation of the right lateral ventricle as compared to previous exam, suggesting mild and/or early trapping. 2. Smaller largely subacute appearing right extra-axial collection measuring up to 7 mm in thickness, also likely subdural. No associated mass effect. 3. Small soft tissue contusions about the forehead, left greater than right. No calvarial fracture. 4. Underlying atrophy with chronic small vessel ischemic disease. CT CERVICAL SPINE: 1. No acute traumatic injury  within the cervical spine. 2. Mild-to-moderate multilevel cervical spondylosis with mild spinal stenosis at C5-6. Critical Value/emergent results were called by telephone at the time of interpretation on 12/17/2020 at 9:26 pm to provider [name] , who verbally acknowledged these results. Electronically Signed   By: Rise Mu M.D.   On: 12/17/2020 21:47   CT CERVICAL SPINE WO CONTRAST  Result Date: 12/17/2020 CLINICAL DATA:  Initial evaluation for acute head trauma, fall. EXAM: CT HEAD WITHOUT CONTRAST CT CERVICAL SPINE WITHOUT CONTRAST TECHNIQUE: Multidetector CT imaging of the head and cervical spine was performed following the standard protocol without intravenous contrast. Multiplanar CT image reconstructions of the cervical spine were also generated. COMPARISON:  Prior head CT from 11/10/2020. FINDINGS: CT HEAD FINDINGS Brain: There has been interval development of a fairly large subdural hematoma on the left, largely isodense to adjacent brain and subacute in appearance, although a small amount of more hyperdense blood products is seen within this collection. Collection measures up to approximately 3 cm in diameter. Associated mass effect on the subjacent left cerebral hemisphere with up to 1 cm of left-to-right shift. Additionally, there is a  smaller largely subacute appearing extra-axial collection overlying the right cerebral convexity measuring up to 7 mm in thickness (series 4, image 32), likely subdural as well. No significant mass effect. There is partial effacement of the left lateral ventricle with asymmetric dilatation of the right lateral ventricle, slightly increased from previous exam, which could reflect a degree of mild trapping. Underlying atrophy with chronic small vessel ischemic disease. No acute large vessel territory infarct. No mass lesion. Vascular: No hyperdense vessel. Calcified atherosclerosis present at skull base. Skull: Small soft tissue contusions about the forehead, left greater than right. Calvarium intact. Sinuses/Orbits: Globes and orbital soft tissues demonstrate no acute finding. Paranasal sinuses are clear. No mastoid effusion. Other: None. CT CERVICAL SPINE FINDINGS Alignment: Straightening of the normal cervical lordosis. Trace anterolisthesis of C4 on C5, C5 on C6, and C6 on C7, likely chronic and degenerative. Skull base and vertebrae: Skull base intact. Normal C1-2 articulations are preserved. Dens is intact. Vertebral body height maintained. No acute fracture. Soft tissues and spinal canal: Paraspinous soft tissues demonstrate no acute finding. No abnormal prevertebral edema. Spinal canal demonstrates no acute finding. Vascular calcifications about the carotid bifurcations. Disc levels: Mild-to-moderate multilevel cervical spondylosis with mild spinal stenosis at C5-6. Upper chest: Visualized upper chest demonstrates no acute finding. Partially visualized lung apices are grossly clear. Other: None. IMPRESSION: CT BRAIN: 1. Interval development of a fairly large left subdural hematoma measuring up to 3 cm, largely subacute in nature. Mass effect on the subjacent left cerebral hemisphere with up to 1 cm left-to-right shift. Mild dilatation of the right lateral ventricle as compared to previous exam, suggesting mild  and/or early trapping. 2. Smaller largely subacute appearing right extra-axial collection measuring up to 7 mm in thickness, also likely subdural. No associated mass effect. 3. Small soft tissue contusions about the forehead, left greater than right. No calvarial fracture. 4. Underlying atrophy with chronic small vessel ischemic disease. CT CERVICAL SPINE: 1. No acute traumatic injury within the cervical spine. 2. Mild-to-moderate multilevel cervical spondylosis with mild spinal stenosis at C5-6. Critical Value/emergent results were called by telephone at the time of interpretation on 12/17/2020 at 9:26 pm to provider [name] , who verbally acknowledged these results. Electronically Signed   By: Rise Mu M.D.   On: 12/17/2020 21:47   DG Pelvis Portable  Result Date: 12/17/2020 CLINICAL  DATA:  Fall. EXAM: PORTABLE PELVIS 1-2 VIEWS COMPARISON:  11/10/2020 FINDINGS: Diffuse bony under mineralization. The cortical margins of the bony pelvis are intact. No fracture. Pubic symphysis and sacroiliac joints are congruent. Both femoral heads are well-seated in the respective acetabula. IMPRESSION: No pelvic fracture. Electronically Signed   By: Narda Rutherford M.D.   On: 12/17/2020 21:05   DG Chest Port 1 View  Result Date: 12/17/2020 CLINICAL DATA:  Fall. EXAM: PORTABLE CHEST 1 VIEW COMPARISON:  Radiograph 11/10/2020 FINDINGS: Again seen hyperinflation with interstitial coarsening.The cardiomediastinal contours are normal. The lungs are clear. Pulmonary vasculature is normal. No consolidation, pleural effusion, or pneumothorax. No acute osseous abnormalities are seen. IMPRESSION: 1. No acute findings. 2. Chronic hyperinflation and interstitial coarsening. Electronically Signed   By: Narda Rutherford M.D.   On: 12/17/2020 21:04    Procedures Procedures   Medications Ordered in ED Medications  levETIRAcetam (KEPPRA) tablet 500 mg (500 mg Oral Not Given 12/17/20 2308)  Tdap (BOOSTRIX)  injection 0.5 mL (0.5 mLs Intramuscular Given 12/17/20 2125)  levETIRAcetam (KEPPRA) IVPB 500 mg/100 mL premix (0 mg Intravenous Stopped 12/17/20 2308)    ED Course  I have reviewed the triage vital signs and the nursing notes.  Pertinent labs & imaging results that were available during my care of the patient were reviewed by me and considered in my medical decision making (see chart for details).    MDM Rules/Calculators/A&P                           80 year old male with medical history below to include dementia presenting to the emergency department after a fall.  Patient was brought in by EMS from [facility] after 2 falls today.  The POA for the patient initially refused medical transport after the patient's initial fall.  Unclear per EMS if the fall was witnessed.  The second fall was witnessed by staff.  Level 5 caveat due to patient dementia.  Additional history obtained from the patient's son, Cameron Mcclain at [phone].  The patient has had several falls over the past few weeks, 1 fall 5 weeks ago, 1 fall 3 weeks ago and 2 falls today.  On chart review, on 11/10/2020, the patient had interval development of subdural hygromas overlying cerebral convexities with no mass-effect with no intracranial hemorrhage.  He is at his baseline neurologic status per his son.  No new neurologic deficits.  On arrival, the patient was afebrile, hemodynamically stable, mildly hypertensive BP 145/82, at his neurologic baseline of pleasant dementia.  No focal neurologic deficits identified on exam.  No acute traumatic injuries other than abrasion on the forehead noted.  The patient did have right lower extremity edema with an elevated dimer.  Unfortunately we do not have overnight ultrasound imaging.  CT of the head was performed which revealed interval development of a fairly large left subdural hematoma measuring up to 3 cm, likely subacute in nature with associated mass-effect on the left  cerebral hemisphere with up to 1 cm of left-to-right midline shift.  Mild dilatation of the right lateral ventricle compared to previous exam 1 month ago concerning for possible mild and/or early trapping.  Smaller largely subacute appearing right extra-axial collection measuring up to 7 mm in thickness also likely a subdural hematoma with no associated mass-effect.  No other injuries identified on trauma imaging as per above.  I spoke with on-call neurosurgery regarding the patient's subacute subdural hematoma.  We will plan  to administer 500 mg of Keppra twice daily.  Recommended admission to the hospital for observation and consideration for possible surgical intervention based on goals of care after discussion with the patient's son.   Dr. Imogene Burn accepted the patient in admission to the hospitalist service and the patient was subsequently admitted in stable condition.  Final Clinical Impression(s) / ED Diagnoses Final diagnoses:  Fall  SDH (subdural hematoma)  Positive D dimer  Right leg swelling    Rx / DC Orders ED Discharge Orders     None        [name], MD 12/18/20 0015

## 2020-12-17 NOTE — Progress Notes (Signed)
80 yo M with dementia, small chronic SDHs seen in October with recent falls, found to have a large left subacute-chronic SDH.  He likely would benefit from drainage depending on goals of care, etc.

## 2020-12-17 NOTE — H&P (Signed)
Cameron Mcclain UUE:[id number] DOB: May 05, 1940 DOA: 12/17/2020  PCP: Lucky Cowboy, MD   Outpatient Specialists:  NONE   Patient arrived to ER on 12/17/20 at 1947 Referred by Attending Therisa Doyne, MD   Patient coming from:   From facility [facility]  Chief Complaint:   Chief Complaint  Patient presents with   Fall    HPI: Cameron Mcclain is a 80 y.o. male with medical history significant of dementia  Falls, small chronic subacute/chornic SDH, HTN, HLD,   Presented with   falls x2  Initially refused transfer second fall was witnessed by staff He has been lately having more lower extremity edema and since then has been having more falls.  Right worse than left.  Here today for hit his head on a door.  Not on any blood thinners At baseline able to walk but unsteady Does not recognize family Per Son he was told pt was reaching over lost balance and fell striking his head on the door frame   Former smoker, no recent ETOH  Has been vaccinated against COVID and boosted   Initial COVID TEST  NEGATIVE   Lab Results  Component Value Date   SARSCOV2NAA NEGATIVE 12/17/2020   Regarding pertinent Chronic problems:   Hyperlipidemia -  on statins Zetia, resuvastatin  Lipid Panel     Component Value Date/Time   CHOL 210 (H) 09/26/2020 1021   TRIG 91 09/26/2020 1021   HDL 53 09/26/2020 1021   CHOLHDL 4.0 09/26/2020 1021   VLDL 13 09/11/2016 1114   LDLCALC 138 (H) 09/26/2020 1021    HTN  not on BP meds    Dementia - not on any meds   While in ER: Found to have large left subacute chronic SDH neurosurgery was consulted recommended he may benefit from draining CT head today shows 3 cm SDH on left side. 1 cm left to right midline shift  discussed with neurosurgery(Dr. Maisie Fus). neurosurgery may want to drain SDH. Started on po keppra.      ED Triage Vitals  Enc Vitals Group     BP 12/17/20 1955 (!) 145/82     Pulse Rate 12/17/20 1955 72     Resp  12/17/20 1955 14     Temp 12/17/20 1955 98.4 F (36.9 C)     Temp Source 12/17/20 1955 Oral     SpO2 12/17/20 1955 96 %     Weight --      Height --      Head Circumference --      Peak Flow --      Pain Score 12/17/20 1958 0     Pain Loc --      Pain Edu? --      Excl. in GC? --       _________________________________________ Significant initial  Findings: Abnormal Labs Reviewed  COMPREHENSIVE METABOLIC PANEL - Abnormal; Notable for the following components:      Result Value   BUN 26 (*)    All other components within normal limits  CBC - Abnormal; Notable for the following components:   RBC 3.47 (*)    Hemoglobin 11.5 (*)    HCT 33.5 (*)    All other components within normal limits  D-DIMER, QUANTITATIVE (NOT AT [facility]) - Abnormal; Notable for the following components:   D-Dimer, Quant 1.14 (*)    All other components within normal limits   ____________________________________________ Ordered CT HEAD CT head today shows 3 cm SDH on left  side. 1 cm left to right midline shift  CXR - 1. No acute findings. 2. Chronic hyperinflation and interstitial coarsening.  _________________________  ECG: Ordered The recent clinical data is shown below. Vitals:   12/17/20 2130 12/17/20 2200 12/17/20 2230 12/17/20 2312  BP: (!) 148/85 (!) 123/94 (!) 128/92   Pulse: 80 71 73   Resp: Temp:    98 F (36.7 C)  TempSrc:    Oral  SpO2: 98% 98% 95%   WBC     Component Value Date/Time   WBC 7.4 12/17/2020 2010   LYMPHSABS 1.3 11/10/2020 1236   MONOABS 0.7 11/10/2020 1236   EOSABS 0.2 11/10/2020 1236   BASOSABS 0.1 11/10/2020 1236     UA  ordered   Results for orders placed or performed during the hospital encounter of 12/17/20  Resp Panel by RT-PCR (Flu A&B, Covid) Nasopharyngeal Swab     Status: None   Collection Time: 12/17/20  8:10 PM   Specimen: Nasopharyngeal Swab; Nasopharyngeal(NP) swabs in vial transport medium  Result Value Ref Range Status   SARS  Coronavirus 2 by RT PCR NEGATIVE NEGATIVE Final         Influenza A by PCR NEGATIVE NEGATIVE Final   Influenza B by PCR NEGATIVE NEGATIVE Final        _______________________________________________________ ER Provider Called:  NS   Dr. Maisie Fus They Recommend admit to medicine  Will see in AM   _______________________________________________ Hospitalist was called for admission for subacute SDH  The following Work up has been ordered so far:  Orders Placed This Encounter  Procedures   Resp Panel by RT-PCR (Flu A&B, Covid) Nasopharyngeal Swab   DG Chest Port 1 View   DG Pelvis Portable   CT HEAD WO CONTRAST   CT CERVICAL SPINE WO CONTRAST   DG Elbow Complete Right   Comprehensive metabolic panel   CBC   Protime-INR   D-dimer, quantitative   Diet NPO time specified   Cardiac monitoring   Measure blood pressure   Initiate Carrier Fluid Protocol   Cardiac monitoring   Consult to neurosurgery   Consult to hospitalist   Pulse oximetry, continuous   Admit to Inpatient (patient's expected length of stay will be greater than 2 midnights or inpatient only procedure)    Following Medications were ordered in ER: Medications  levETIRAcetam (KEPPRA) tablet 500 mg (500 mg Oral Not Given 12/17/20 2308)  Tdap (BOOSTRIX) injection 0.5 mL (0.5 mLs Intramuscular Given 12/17/20 2125)  levETIRAcetam (KEPPRA) IVPB 500 mg/100 mL premix (0 mg Intravenous Stopped 12/17/20 2308)        Consult Orders  (From admission, onward)           Start     Ordered   12/17/20 2201  Consult to hospitalist  Once       Provider:  (Not yet assigned)  Question Answer Comment  Place call to: [facility]   Reason for Consult Admit      12/17/20 2200            OTHER Significant initial  Findings:  labs showing:  Recent Labs  Lab 12/17/20 2010  NA 135  K 4.4  CO2 26  GLUCOSE 93  BUN 26*  CREATININE 0.89  CALCIUM 9.0    Cr  stable,    Lab Results  Component Value Date    CREATININE 0.89 12/17/2020   CREATININE 0.70 11/10/2020   CREATININE 0.76 11/05/2020    Recent Labs  Lab 12/17/20 2010  AST 28  ALT 17  ALKPHOS 61  BILITOT 0.6  PROT 6.6  ALBUMIN 3.8   Lab Results  Component Value Date   CALCIUM 9.0 12/17/2020    Plt: Lab Results  Component Value Date   PLT 225 12/17/2020     COVID-19 Labs  Recent Labs    12/17/20 2010  DDIMER 1.14*    Lab Results  Component Value Date   SARSCOV2NAA NEGATIVE 12/17/2020       Recent Labs  Lab 12/17/20 2010  WBC 7.4  HGB 11.5*  HCT 33.5*  MCV 96.5  PLT 225    HG/HCT   stable     Component Value Date/Time   HGB 11.5 (L) 12/17/2020 2010   HCT 33.5 (L) 12/17/2020 2010   MCV 96.5 12/17/2020 2010     DM  labs:  HbA1C: Recent Labs    09/26/20 1021  HGBA1C 5.2       CBG (last 3)  No results for input(s): GLUCAP in the last 72 hours.        Cultures:    Component Value Date/Time   SDES URINE, RANDOM 05/04/2013 0022   SPECREQUEST NONE 05/04/2013 0022   CULT  05/03/2013 1644    INSIGNIFICANT GROWTH Performed at [facility]   REPTSTATUS 05/04/2013 FINAL 05/04/2013 0022     Radiological Exams on Admission: DG Elbow Complete Right  Result Date: 12/17/2020 CLINICAL DATA:  Unwitnessed fall. EXAM: RIGHT ELBOW - COMPLETE 3+ VIEW COMPARISON:  None. FINDINGS: There is no evidence of fracture, dislocation, or joint effusion. Mild degenerative changes spurring. Tiny olecranon spur soft tissues are unremarkable. IMPRESSION: No fracture or subluxation of the right elbow. Mild degenerative change. Electronically Signed   By: Narda Rutherford M.D.   On: 12/17/2020 21:06   CT HEAD WO CONTRAST  Result Date: 12/17/2020 CLINICAL DATA:  Initial evaluation for acute head trauma, fall. EXAM: CT HEAD WITHOUT CONTRAST CT CERVICAL SPINE WITHOUT CONTRAST TECHNIQUE: Multidetector CT imaging of the head and cervical spine was performed following the standard protocol without intravenous  contrast. Multiplanar CT image reconstructions of the cervical spine were also generated. COMPARISON:  Prior head CT from 11/10/2020. FINDINGS: CT HEAD FINDINGS Brain: There has been interval development of a fairly large subdural hematoma on the left, largely isodense to adjacent brain and subacute in appearance, although a small amount of more hyperdense blood products is seen within this collection. Collection measures up to approximately 3 cm in diameter. Associated mass effect on the subjacent left cerebral hemisphere with up to 1 cm of left-to-right shift. Additionally, there is a smaller largely subacute appearing extra-axial collection overlying the right cerebral convexity measuring up to 7 mm in thickness (series 4, image 32), likely subdural as well. No significant mass effect. There is partial effacement of the left lateral ventricle with asymmetric dilatation of the right lateral ventricle, slightly increased from previous exam, which could reflect a degree of mild trapping. Underlying atrophy with chronic small vessel ischemic disease. No acute large vessel territory infarct. No mass lesion. Vascular: No hyperdense vessel. Calcified atherosclerosis present at skull base. Skull: Small soft tissue contusions about the forehead, left greater than right. Calvarium intact. Sinuses/Orbits: Globes and orbital soft tissues demonstrate no acute finding. Paranasal sinuses are clear. No mastoid effusion. Other: None. CT CERVICAL SPINE FINDINGS Alignment: Straightening of the normal cervical lordosis. Trace anterolisthesis of C4 on C5, C5 on C6, and C6 on C7, likely chronic and degenerative. Skull base and  vertebrae: Skull base intact. Normal C1-2 articulations are preserved. Dens is intact. Vertebral body height maintained. No acute fracture. Soft tissues and spinal canal: Paraspinous soft tissues demonstrate no acute finding. No abnormal prevertebral edema. Spinal canal demonstrates no acute finding. Vascular  calcifications about the carotid bifurcations. Disc levels: Mild-to-moderate multilevel cervical spondylosis with mild spinal stenosis at C5-6. Upper chest: Visualized upper chest demonstrates no acute finding. Partially visualized lung apices are grossly clear. Other: None. IMPRESSION: CT BRAIN: 1. Interval development of a fairly large left subdural hematoma measuring up to 3 cm, largely subacute in nature. Mass effect on the subjacent left cerebral hemisphere with up to 1 cm left-to-right shift. Mild dilatation of the right lateral ventricle as compared to previous exam, suggesting mild and/or early trapping. 2. Smaller largely subacute appearing right extra-axial collection measuring up to 7 mm in thickness, also likely subdural. No associated mass effect. 3. Small soft tissue contusions about the forehead, left greater than right. No calvarial fracture. 4. Underlying atrophy with chronic small vessel ischemic disease. CT CERVICAL SPINE: 1. No acute traumatic injury within the cervical spine. 2. Mild-to-moderate multilevel cervical spondylosis with mild spinal stenosis at C5-6. Critical Value/emergent results were called by telephone at the time of interpretation on 12/17/2020 at 9:26 pm to provider [name] , who verbally acknowledged these results. Electronically Signed   By: Rise Mu M.D.   On: 12/17/2020 21:47   CT CERVICAL SPINE WO CONTRAST  Result Date: 12/17/2020 CLINICAL DATA:  Initial evaluation for acute head trauma, fall. EXAM: CT HEAD WITHOUT CONTRAST CT CERVICAL SPINE WITHOUT CONTRAST TECHNIQUE: Multidetector CT imaging of the head and cervical spine was performed following the standard protocol without intravenous contrast. Multiplanar CT image reconstructions of the cervical spine were also generated. COMPARISON:  Prior head CT from 11/10/2020. FINDINGS: CT HEAD FINDINGS Brain: There has been interval development of a fairly large subdural hematoma on the left, largely  isodense to adjacent brain and subacute in appearance, although a small amount of more hyperdense blood products is seen within this collection. Collection measures up to approximately 3 cm in diameter. Associated mass effect on the subjacent left cerebral hemisphere with up to 1 cm of left-to-right shift. Additionally, there is a smaller largely subacute appearing extra-axial collection overlying the right cerebral convexity measuring up to 7 mm in thickness (series 4, image 32), likely subdural as well. No significant mass effect. There is partial effacement of the left lateral ventricle with asymmetric dilatation of the right lateral ventricle, slightly increased from previous exam, which could reflect a degree of mild trapping. Underlying atrophy with chronic small vessel ischemic disease. No acute large vessel territory infarct. No mass lesion. Vascular: No hyperdense vessel. Calcified atherosclerosis present at skull base. Skull: Small soft tissue contusions about the forehead, left greater than right. Calvarium intact. Sinuses/Orbits: Globes and orbital soft tissues demonstrate no acute finding. Paranasal sinuses are clear. No mastoid effusion. Other: None. CT CERVICAL SPINE FINDINGS Alignment: Straightening of the normal cervical lordosis. Trace anterolisthesis of C4 on C5, C5 on C6, and C6 on C7, likely chronic and degenerative. Skull base and vertebrae: Skull base intact. Normal C1-2 articulations are preserved. Dens is intact. Vertebral body height maintained. No acute fracture. Soft tissues and spinal canal: Paraspinous soft tissues demonstrate no acute finding. No abnormal prevertebral edema. Spinal canal demonstrates no acute finding. Vascular calcifications about the carotid bifurcations. Disc levels: Mild-to-moderate multilevel cervical spondylosis with mild spinal stenosis at C5-6. Upper chest: Visualized upper chest demonstrates no  acute finding. Partially visualized lung apices are grossly clear.  Other: None. IMPRESSION: CT BRAIN: 1. Interval development of a fairly large left subdural hematoma measuring up to 3 cm, largely subacute in nature. Mass effect on the subjacent left cerebral hemisphere with up to 1 cm left-to-right shift. Mild dilatation of the right lateral ventricle as compared to previous exam, suggesting mild and/or early trapping. 2. Smaller largely subacute appearing right extra-axial collection measuring up to 7 mm in thickness, also likely subdural. No associated mass effect. 3. Small soft tissue contusions about the forehead, left greater than right. No calvarial fracture. 4. Underlying atrophy with chronic small vessel ischemic disease. CT CERVICAL SPINE: 1. No acute traumatic injury within the cervical spine. 2. Mild-to-moderate multilevel cervical spondylosis with mild spinal stenosis at C5-6. Critical Value/emergent results were called by telephone at the time of interpretation on 12/17/2020 at 9:26 pm to provider [name] , who verbally acknowledged these results. Electronically Signed   By: Rise Mu M.D.   On: 12/17/2020 21:47   DG Pelvis Portable  Result Date: 12/17/2020 CLINICAL DATA:  Fall. EXAM: PORTABLE PELVIS 1-2 VIEWS COMPARISON:  11/10/2020 FINDINGS: Diffuse bony under mineralization. The cortical margins of the bony pelvis are intact. No fracture. Pubic symphysis and sacroiliac joints are congruent. Both femoral heads are well-seated in the respective acetabula. IMPRESSION: No pelvic fracture. Electronically Signed   By: Narda Rutherford M.D.   On: 12/17/2020 21:05   DG Chest Port 1 View  Result Date: 12/17/2020 CLINICAL DATA:  Fall. EXAM: PORTABLE CHEST 1 VIEW COMPARISON:  Radiograph 11/10/2020 FINDINGS: Again seen hyperinflation with interstitial coarsening.The cardiomediastinal contours are normal. The lungs are clear. Pulmonary vasculature is normal. No consolidation, pleural effusion, or pneumothorax. No acute osseous abnormalities are seen.  IMPRESSION: 1. No acute findings. 2. Chronic hyperinflation and interstitial coarsening. Electronically Signed   By: Narda Rutherford M.D.   On: 12/17/2020 21:04   _______________________________________________________________________________________________________ Latest  Blood pressure (!) 128/92, pulse 73, temperature 98 F (36.7 C), temperature source Oral, resp. rate 10, SpO2 95 %.   Review of Systems:    Pertinent positives include: fall  Constitutional:  No weight loss, night sweats, Fevers, chills, fatigue, weight loss  HEENT:  No headaches, Difficulty swallowing,Tooth/dental problems,Sore throat,  No sneezing, itching, ear ache, nasal congestion, post nasal drip,  Cardio-vascular:  No chest pain, Orthopnea, PND, anasarca, dizziness, palpitations.no Bilateral lower extremity swelling  GI:  No heartburn, indigestion, abdominal pain, nausea, vomiting, diarrhea, change in bowel habits, loss of appetite, melena, blood in stool, hematemesis Resp:  no shortness of breath at rest. No dyspnea on exertion, No excess mucus, no productive cough, No non-productive cough, No coughing up of blood.No change in color of mucus.No wheezing. Skin:  no Mcclain or lesions. No jaundice GU:  no dysuria, change in color of urine, no urgency or frequency. No straining to urinate.  No flank pain.  Musculoskeletal:  No joint pain or no joint swelling. No decreased range of motion. No back pain.  Psych:  No change in mood or affect. No depression or anxiety. No memory loss.  Neuro: no localizing neurological complaints, no tingling, no weakness, no double vision, no gait abnormality, no slurred speech, no confusion  All systems reviewed and apart from HOPI all are negative _______________________________________________________________________________________________ Past Medical History:   Past Medical History:  Diagnosis Date   Glaucoma    OSA (obstructive sleep apnea)    RLS (restless legs  syndrome)    Vitamin D deficiency  Past Surgical History:  Procedure Laterality Date   axillary Right 1986   biopsy of axillary nodes   PILONIDAL CYST / SINUS EXCISION  1959    Social History:  Ambulatory  independently cane, walker  wheelchair bound, bed bound     reports that he quit smoking about 11 years ago. His smoking use included cigarettes. He has never used smokeless tobacco. He reports current alcohol use. He reports that he does not use drugs.    Family History:  Family History  Problem Relation Age of Onset   Hypertension Mother    Diabetes Mother    Hypertension Father    Cancer Father    ______________________________________________________________________________________________ Allergies: Allergies  Allergen Reactions   Benadryl [Diphenhydramine Hcl] Other (See Comments)    Hallucinations, itching, shaking     Prior to Admission medications   Medication Sig Start Date End Date Taking? Authorizing Provider  calcium carbonate (OS-CAL) 600 MG tablet Takes 1 tab / day 06/18/17   Lucky Cowboy, MD  Cholecalciferol (VITAMIN D) 2000 units CAPS Takes 1 cap 2 x / day 06/18/17   Lucky Cowboy, MD  Ciclopirox 1 % shampoo 1 Application daily to scalp and leave for 3-5 min 12/20/19   Judd Gaudier, NP  clotrimazole (LOTRIMIN) 1 % cream Apply 1 application topically 2 (two) times daily. 12/28/19   Judd Gaudier, NP  ezetimibe (ZETIA) 10 MG tablet Take 1 tablet Daily for Cholesterol 08/17/18   Lucky Cowboy, MD  gemfibrozil (LOPID) 600 MG tablet Take 1 tablet 2 x /day with Meals for Triglycerides (Blood Fats) 11/26/18   Lucky Cowboy, MD  ketoconazole (NIZORAL) 2 % cream APPLY TOPICALLY TO THE AFFECTED AREA TWICE DAILY 10/17/20   Revonda Humphrey, NP  latanoprost (XALATAN) 0.005 % ophthalmic solution Place 1 drop into both eyes at bedtime.  12/23/12   [provider]  loratadine (CLARITIN) 10 MG tablet Take 10 mg by mouth daily.    [provider]  Magnesium 250 MG TABS Takes 1 tab / day 06/18/17   Lucky Cowboy, MD  mirtazapine (REMERON) 30 MG tablet Take 1/2 to 1 tablet 1 hour before Bedtime for Appetite & Sleep 10/03/20   Lucky Cowboy, MD  Omega-3 Fatty Acids (FISH OIL) 1000 MG CAPS Take 1 capsule (1,000 mg total) by mouth 2 (two) times daily. 12/29/12   Lucky Cowboy, MD  rosuvastatin (CRESTOR) 10 MG tablet Take 1 tablet Daily for Cholesterol 10/04/20   Lucky Cowboy, MD  triamcinolone ointment (KENALOG) 0.1 % Apply 1 application topically 2 (two) times daily. To face Mcclain for 1-2 weeks then stop. 01/14/20   Elder Negus, NP  TURMERIC PO Take by mouth daily.    [provider]    ___________________________________________________________________________________________________ Physical Exam: Vitals with BMI 12/17/2020 12/17/2020 12/17/2020  Height - - -  Weight - - -  BMI - - -  Systolic 128 123 161  Diastolic 92 94 85  Pulse 73 71 80     1. General:  in No  Acute distress   Chronically ill  -appearing 2. Psychological: Alert and  Oriented to self 3. Head/ENT:    Dry Mucous Membranes                          Head Non traumatic, neck supple                           Dentition 4. SKIN:  decreased Skin turgor,  Skin clean Dry and intact no Mcclain 5. Heart: Regular rate and rhythm no  Murmur, no Rub or gallop 6. Lungs:   no wheezes or crackles   7. Abdomen: Soft,  non-tender, Non distended bowel sounds present 8. Lower extremities: no clubbing, cyanosis, right leg  edema 9. Neurologically Grossly intact, moving all 4 extremities equally  10. MSK: Normal range of motion    Chart has been reviewed  ______________________________________________________________________________________________  Assessment/Plan  80 y.o. male with medical history significant of dementia  Falls, small chronic subacute/chornic SDH, HTN, HLD,   Admitted for Subdural hematoma  Present on Admission:   Subdural hematoma admit to progressive neurosurgery aware discussed with Dr. Maisie Fus May need operative intervention next few days.  Per neurosurgery okay to feed for now. Make sure on Keppra 500 mg twice daily Neurochecks every 2 hours for now   Moderate dementia with behavioral disturbance - expect some degree of sundowning   Hyperlipidemia, mixed - restart lipids when able to tolerate    Essential hypertension - stable not on home meds   Chronic obstructive pulmonary disease (HCC) - chronic stable   Fall - PT OT assess ment prior to DC  Right leg edema - order doppler Other plan as per orders.  DVT prophylaxis:  SCD     Code Status:    Code Status: DNR   DNR/DNI   as per  family  I had personally discussed CODE STATUS with  family     Family Communication:   Family not at  Bedside  plan of care was discussed on the phone with  Son,   Disposition Plan:                              Back to current facility when stable                           Following barriers for discharge:                            SDH treatmet                           Will need consultants to evaluate patient prior to discharge                       Would benefit from PT/OT eval prior to DC  Ordered                                      Transition of care consulted            Consults called:  NS is aware  Admission status:  ED Disposition     ED Disposition  Admit   Condition  --   Comment  Hospital Area: [facility] [[id number]]  Level of Care: Progressive [102]  Admit to Progressive based on following criteria: NEUROLOGICAL AND NEUROSURGICAL complex patients with significant risk of instability, who do not meet ICU criteria, yet require close observation or frequent assessment (< / = every 2 - 4 hours) with medical / nursing intervention.  May admit patient to [facility] or [facility] if equivalent level of care is available::  No  Interfacility transfer: Yes  Covid  Evaluation: Confirmed COVID Negative  Diagnosis: Subdural hematoma [161096]  Admitting Physician: Imogene Burn, ERIC [[id number]]  Attending Physician: Imogene Burn, ERIC [[id number]]  Estimated length of stay: 5 - 7 days  Certification:: I certify this patient will need inpatient services for at least 2 midnights           inpatient     I Expect 2 midnight stay secondary to severity of patient's current illness need for inpatient interventions justified by the following:    Severe lab/radiological/exam abnormalities including:    SDH and extensive comorbidities including:  dementia    That are currently affecting medical management.   I expect  patient to be hospitalized for 2 midnights requiring inpatient medical care.  Patient is at high risk for adverse outcome (such as loss of life or disability) if not treated.  Indication for inpatient stay as follows:    Need for operative/procedural  intervention    Need for IV fluids,     Level of care   progressive tele indefinitely please discontinue once patient no longer qualifies COVID-19 Labs    Lab Results  Component Value Date   SARSCOV2NAA NEGATIVE 12/17/2020     Precautions: admitted as   Covid Negative    PPE: Used by the provider:   [id number]  eye Goggles,  Gloves     Zoeann Mol 12/18/2020, 1:48 AM    [facility]     after 2 AM please page floor coverage PA If 7AM-7PM, please contact the day team taking care of the patient using [email or web address]   Patient was evaluated in the context of the global COVID-19 pandemic, which necessitated consideration that the patient might be at risk for infection with the SARS-CoV-2 virus that causes COVID-19. Institutional protocols and algorithms that pertain to the evaluation of patients at risk for COVID-19 are in a state of rapid change based on information released by regulatory bodies including the CDC and federal and state organizations. These policies and algorithms were followed  during the patient's care.

## 2020-12-17 NOTE — ED Notes (Signed)
Patient transported to CT 

## 2020-12-17 NOTE — ED Notes (Signed)
Attempt report x2 to 4NP

## 2020-12-17 NOTE — Progress Notes (Signed)
[facility] will assume care on arrival to accepting facility. Until arrival, care as per EDP. However, [facility] available 24/7 for questions and assistance.   Nursing staff please page [facility] Admits and Consults ([phone]) as soon as the patient arrives to the hospital.  Karmine Kauer, DO  

## 2020-12-17 NOTE — ED Triage Notes (Signed)
Pt BIB [facility] from [facility] for 2 falls today. First fall POA refused transport to facility. Unsure if it was a witnessed fall. Second fall was witnessed by staff. Pt w/RLE edema, staff reports the falls started after the leg swelling. Pt will not stay in bed to elevate leg. Pt hit his head on the door. Pt denies any pain, staff denies LOC, not taking any blood thinners    BP 128/78 HR 70 97% RA  RR 16

## 2020-12-18 ENCOUNTER — Other Ambulatory Visit: Payer: Self-pay | Admitting: Neurosurgery

## 2020-12-18 ENCOUNTER — Inpatient Hospital Stay (HOSPITAL_COMMUNITY): Payer: PPO

## 2020-12-18 DIAGNOSIS — J449 Chronic obstructive pulmonary disease, unspecified: Secondary | ICD-10-CM | POA: Diagnosis not present

## 2020-12-18 DIAGNOSIS — Z66 Do not resuscitate: Secondary | ICD-10-CM | POA: Diagnosis not present

## 2020-12-18 DIAGNOSIS — Z515 Encounter for palliative care: Secondary | ICD-10-CM | POA: Diagnosis not present

## 2020-12-18 DIAGNOSIS — M7989 Other specified soft tissue disorders: Secondary | ICD-10-CM | POA: Diagnosis not present

## 2020-12-18 DIAGNOSIS — I10 Essential (primary) hypertension: Secondary | ICD-10-CM | POA: Diagnosis not present

## 2020-12-18 DIAGNOSIS — R609 Edema, unspecified: Secondary | ICD-10-CM

## 2020-12-18 DIAGNOSIS — Z23 Encounter for immunization: Secondary | ICD-10-CM | POA: Diagnosis not present

## 2020-12-18 DIAGNOSIS — G309 Alzheimer's disease, unspecified: Secondary | ICD-10-CM | POA: Diagnosis not present

## 2020-12-18 DIAGNOSIS — S065XAA Traumatic subdural hemorrhage with loss of consciousness status unknown, initial encounter: Secondary | ICD-10-CM | POA: Diagnosis not present

## 2020-12-18 DIAGNOSIS — F02B18 Dementia in other diseases classified elsewhere, moderate, with other behavioral disturbance: Secondary | ICD-10-CM | POA: Diagnosis not present

## 2020-12-18 DIAGNOSIS — W19XXXA Unspecified fall, initial encounter: Secondary | ICD-10-CM | POA: Diagnosis present

## 2020-12-18 DIAGNOSIS — R296 Repeated falls: Secondary | ICD-10-CM | POA: Diagnosis not present

## 2020-12-18 DIAGNOSIS — I6202 Nontraumatic subacute subdural hemorrhage: Secondary | ICD-10-CM | POA: Diagnosis not present

## 2020-12-18 DIAGNOSIS — Z20822 Contact with and (suspected) exposure to covid-19: Secondary | ICD-10-CM | POA: Diagnosis not present

## 2020-12-18 DIAGNOSIS — I6203 Nontraumatic chronic subdural hemorrhage: Secondary | ICD-10-CM | POA: Diagnosis not present

## 2020-12-18 DIAGNOSIS — E782 Mixed hyperlipidemia: Secondary | ICD-10-CM | POA: Diagnosis not present

## 2020-12-18 LAB — URINALYSIS, ROUTINE W REFLEX MICROSCOPIC
Bilirubin Urine: NEGATIVE
Glucose, UA: NEGATIVE mg/dL
Hgb urine dipstick: NEGATIVE
Ketones, ur: 5 mg/dL — AB
Leukocytes,Ua: NEGATIVE
Nitrite: NEGATIVE
Protein, ur: NEGATIVE mg/dL
Specific Gravity, Urine: 1.018 (ref 1.005–1.030)
pH: 5 (ref 5.0–8.0)

## 2020-12-18 LAB — CBC WITH DIFFERENTIAL/PLATELET
Abs Immature Granulocytes: 0.01 10*3/uL (ref 0.00–0.07)
Basophils Absolute: 0.1 10*3/uL (ref 0.0–0.1)
Basophils Relative: 1 %
Eosinophils Absolute: 0.3 10*3/uL (ref 0.0–0.5)
Eosinophils Relative: 4 %
HCT: 32.3 % — ABNORMAL LOW (ref 39.0–52.0)
Hemoglobin: 10.9 g/dL — ABNORMAL LOW (ref 13.0–17.0)
Immature Granulocytes: 0 %
Lymphocytes Relative: 26 %
Lymphs Abs: 1.6 10*3/uL (ref 0.7–4.0)
MCH: 33 pg (ref 26.0–34.0)
MCHC: 33.7 g/dL (ref 30.0–36.0)
MCV: 97.9 fL (ref 80.0–100.0)
Monocytes Absolute: 0.7 10*3/uL (ref 0.1–1.0)
Monocytes Relative: 12 %
Neutro Abs: 3.5 10*3/uL (ref 1.7–7.7)
Neutrophils Relative %: 57 %
Platelets: 208 10*3/uL (ref 150–400)
RBC: 3.3 MIL/uL — ABNORMAL LOW (ref 4.22–5.81)
RDW: 12.4 % (ref 11.5–15.5)
WBC: 6.2 10*3/uL (ref 4.0–10.5)
nRBC: 0 % (ref 0.0–0.2)

## 2020-12-18 LAB — COMPREHENSIVE METABOLIC PANEL
ALT: 18 U/L (ref 0–44)
AST: 25 U/L (ref 15–41)
Albumin: 3 g/dL — ABNORMAL LOW (ref 3.5–5.0)
Alkaline Phosphatase: 55 U/L (ref 38–126)
Anion gap: 6 (ref 5–15)
BUN: 18 mg/dL (ref 8–23)
CO2: 24 mmol/L (ref 22–32)
Calcium: 8.4 mg/dL — ABNORMAL LOW (ref 8.9–10.3)
Chloride: 104 mmol/L (ref 98–111)
Creatinine, Ser: 0.77 mg/dL (ref 0.61–1.24)
GFR, Estimated: >60 mL/min (ref >60)
Glucose, Bld: 83 mg/dL (ref 70–99)
Potassium: 3.7 mmol/L (ref 3.5–5.1)
Sodium: 134 mmol/L — ABNORMAL LOW (ref 135–145)
Total Bilirubin: 0.6 mg/dL (ref 0.3–1.2)
Total Protein: 5.8 g/dL — ABNORMAL LOW (ref 6.5–8.1)

## 2020-12-18 LAB — PHOSPHORUS: Phosphorus: 2.9 mg/dL (ref 2.5–4.6)

## 2020-12-18 LAB — MAGNESIUM: Magnesium: 1.9 mg/dL (ref 1.7–2.4)

## 2020-12-18 LAB — TSH: TSH: 0.927 u[IU]/mL (ref 0.350–4.500)

## 2020-12-18 MED ORDER — SODIUM CHLORIDE 0.9 % IV SOLN
75.0000 mL/h | INTRAVENOUS | Status: AC
  Administered 2020-12-18: 75 mL/h via INTRAVENOUS

## 2020-12-18 MED ORDER — ACETAMINOPHEN 325 MG PO TABS
650.0000 mg | ORAL_TABLET | Freq: Four times a day (QID) | ORAL | Status: DC | PRN
  Administered 2020-12-18: 650 mg via ORAL
  Filled 2020-12-18: qty 2

## 2020-12-18 MED ORDER — HYDROCODONE-ACETAMINOPHEN 5-325 MG PO TABS: 1.0000 | ORAL_TABLET | ORAL | Status: DC | PRN

## 2020-12-18 MED ORDER — ACETAMINOPHEN 650 MG RE SUPP: 650.0000 mg | Freq: Four times a day (QID) | RECTAL | Status: DC | PRN

## 2020-12-18 MED ORDER — LORATADINE 10 MG PO TABS
10.0000 mg | ORAL_TABLET | Freq: Every day | ORAL | Status: DC
  Administered 2020-12-18 – 2020-12-20 (×3): 10 mg via ORAL
  Filled 2020-12-18 (×3): qty 1

## 2020-12-18 MED ORDER — ROSUVASTATIN CALCIUM 5 MG PO TABS
10.0000 mg | ORAL_TABLET | Freq: Every day | ORAL | Status: DC
  Administered 2020-12-18 – 2020-12-19 (×2): 10 mg via ORAL
  Filled 2020-12-18 (×2): qty 2

## 2020-12-18 MED ORDER — LEVETIRACETAM IN NACL 500 MG/100ML IV SOLN
500.0000 mg | Freq: Two times a day (BID) | INTRAVENOUS | Status: DC
  Administered 2020-12-18 – 2020-12-20 (×5): 500 mg via INTRAVENOUS
  Filled 2020-12-18 (×5): qty 100

## 2020-12-18 MED ORDER — LATANOPROST 0.005 % OP SOLN
1.0000 [drp] | Freq: Every day | OPHTHALMIC | Status: DC
  Administered 2020-12-18 – 2020-12-19 (×3): 1 [drp] via OPHTHALMIC
  Filled 2020-12-18: qty 2.5

## 2020-12-18 MED ORDER — HALOPERIDOL LACTATE 5 MG/ML IJ SOLN
1.0000 mg | Freq: Four times a day (QID) | INTRAMUSCULAR | Status: DC | PRN
  Administered 2020-12-18: 1 mg via INTRAVENOUS
  Filled 2020-12-18: qty 1

## 2020-12-18 NOTE — Progress Notes (Signed)
Right lower extremity venous duplex has been completed. Preliminary results can be found in CV Proc through chart review.   12/18/20 2:18 PM Olen Cordial RVT

## 2020-12-18 NOTE — Evaluation (Signed)
Clinical/Bedside Swallow Evaluation Patient Details  Name: Cameron Mcclain MRN: [id number] Date of Birth: [birth year]  Today's Date: 12/18/2020 Time: SLP Start Time (ACUTE ONLY): 1005 SLP Stop Time (ACUTE ONLY): 1035 SLP Time Calculation (min) (ACUTE ONLY): 30 min  Past Medical History:  Past Medical History:  Diagnosis Date   Glaucoma    OSA (obstructive sleep apnea)    RLS (restless legs syndrome)    Vitamin D deficiency    Past Surgical History:  Past Surgical History:  Procedure Laterality Date   axillary Right 1986   biopsy of axillary nodes   PILONIDAL CYST / SINUS EXCISION  1959   HPI:  80yo male admitted 12/17/20 after a fallx2 at his facility. PMH: dementia, falls, small chronic subacute/chronic SDH, HTN, HLD, glaucoma, OSA, RLS. CTHead - large left subacute/chronic SDH.    Assessment / Plan / Recommendation  Clinical Impression  Pt seen at bedside for assessment of swallow function and safety. Pt's son was present, and reports pt has intermittent difficulty with dry crumbly items, and PO meds given with water. Pt was seated upright in recliner, awake and alert. He has difficulty following commands, and communication is generally neologisms outside of social exchanges. Pt had difficulty following commands for CN exam, however, no obvious weakness or asymmetry was noted. He is missing several upper and lower back teeth. Voice quality was clear and strong. Pt accepted trials of ice chips, water, puree, soft fruit, and graham cracker. He was unable to bite the graham cracker when it was handed to him, but was able to take it into his mouth and chew when broken into a small piece. No overt s/s aspiration observed following any PO presentation, even given multiple trials.   Recommend mechanical soft chopped solids, with extra gravy/sauce added for moisture. Full supervision is recommended with all PO intake to maximize safety. Safe swallow precautions reviewed with pt's son and  posted at [facility]. SLP will follow acutely for assessment of diet tolerance and education.  SLP Visit Diagnosis: Dysphagia, unspecified (R13.10)    Aspiration Risk  Moderate aspiration risk;Risk for inadequate nutrition/hydration    Diet Recommendation Dysphagia 3 (Mech soft);Thin liquid   Liquid Administration via: Cup;Straw Medication Administration: Whole meds with puree Supervision: Staff to assist with self feeding;Full supervision/cueing for compensatory strategies Compensations: Minimize environmental distractions;Slow rate;Small sips/bites Postural Changes: Seated upright at 90 degrees;Remain upright for at least 30 minutes after po intake    Other  Recommendations Oral Care Recommendations: Oral care BID    Recommendations for follow up therapy are one component of a multi-disciplinary discharge planning process, led by the attending physician.  Recommendations may be updated based on patient status, additional functional criteria and insurance authorization.  Follow up Recommendations Long-term institutional care without follow-up therapy      Assistance Recommended at Discharge Frequent or constant Supervision/Assistance  Functional Status Assessment Patient has not had a recent decline in their functional status  Frequency and Duration min 1 x/week  1 week;2 weeks       Prognosis Prognosis for Safe Diet Advancement: Fair Barriers to Reach Goals: Cognitive deficits;Severity of deficits      Swallow Study   General Date of Onset: 12/17/20 HPI: 80yo male admitted 12/17/20 after a fallx2 at his facility. PMH: dementia, falls, small chronic subacute/chronic SDH, HTN, HLD, glaucoma, OSA, RLS. CTHead - large left subacute/chronic SDH. Type of Study: Bedside Swallow Evaluation Previous Swallow Assessment: none Diet Prior to this Study: NPO Temperature Spikes Noted: No Respiratory Status:  Room air History of Recent Intubation: No Behavior/Cognition:  Alert;Cooperative;Pleasant mood;Confused;Distractible;Requires cueing;Doesn't follow directions Oral Cavity Assessment: Within Functional Limits Oral Care Completed by SLP: No Oral Cavity - Dentition: Missing dentition Vision: Functional for self-feeding Self-Feeding Abilities: Total assist Patient Positioning: Upright in chair Baseline Vocal Quality: Normal Volitional Cough: Cognitively unable to elicit Volitional Swallow: Unable to elicit    Oral/Motor/Sensory Function Overall Oral Motor/Sensory Function: Within functional limits   Ice Chips Ice chips: Within functional limits Presentation: Spoon   Thin Liquid Thin Liquid: Within functional limits Presentation: Straw    Nectar Thick Nectar Thick Liquid: Not tested   Honey Thick Honey Thick Liquid: Not tested   Puree Puree: Within functional limits Presentation: Spoon   Solid     Solid: Impaired Oral Phase Impairments: Impaired mastication;Poor awareness of bolus Oral Phase Functional Implications: Impaired mastication     Cameron Mcclain B. Murvin Natal, [facility], CCC-SLP Speech Language Pathologist Office: [phone]  Leigh Aurora 12/18/2020,10:59 AM

## 2020-12-18 NOTE — Evaluation (Signed)
Occupational Therapy Evaluation Patient Details Name: Cameron Mcclain MRN: [id number] DOB: Sep 06, 1940 Today's Date: 12/18/2020   History of Present Illness 80yo male who came to ED s/p 2 falls at his facility. Pt found to have an acute SDH in additio to schornic subactue/chornic SDH. PMH: advanced dementia, falls, HTN, HLD Res[facility]   Clinical Impression   Per chart review, pt was living at [facility] memory care and was performing mobility without DME; suspect pt required assistance for ADLs due to cognition. Pt currently requiring Max-Total A for ADLs and Mod A +2 for functional mobility; Max cues throughout. Pt following ~50% of simple, direct cues. Pt would benefit from further acute OT to facilitate safe dc. Recommend return to [facility] with assistance for ADLs and mobility.      Recommendations for follow up therapy are one component of a multi-disciplinary discharge planning process, led by the attending physician.  Recommendations may be updated based on patient status, additional functional criteria and insurance authorization.   Follow Up Recommendations  Long-term institutional care without follow-up therapy (Return to memory care)    Assistance Recommended at Discharge Frequent or constant Supervision/Assistance  Functional Status Assessment  Patient has had a recent decline in their functional status and demonstrates the ability to make significant improvements in function in a reasonable and predictable amount of time.  Equipment Recommendations  None recommended by OT    Recommendations for Other Services PT consult     Precautions / Restrictions Precautions Precautions: Fall Precaution Comments: pt with h/o freq falls Restrictions Weight Bearing Restrictions: No      Mobility Bed Mobility Overal bed mobility: Needs Assistance Bed Mobility: Supine to Sit     Supine to sit: Mod assist;HOB elevated     General bed mobility  comments: pt requireing max verbal and tactile cues to complete task, modA to scoot hips to EOB and elevate trunk    Transfers Overall transfer level: Needs assistance Equipment used: 2 person hand held assist Transfers: Sit to/from Stand Sit to Stand: Mod assist;+2 safety/equipment           General transfer comment: max verbal and tactile cues to complete, pt did initiate via pushing up with hands      Balance Overall balance assessment: Needs assistance Sitting-balance support: Feet supported;No upper extremity supported Sitting balance-Leahy Scale: Fair Sitting balance - Comments: worked with OT on LB dressing in sitting requiring minA to prevent falls   Standing balance support: Bilateral upper extremity supported Standing balance-Leahy Scale: Poor Standing balance comment: dependent on physical assist                           ADL either performed or assessed with clinical judgement   ADL Overall ADL's : Needs assistance/impaired Eating/Feeding: NPO                   Lower Body Dressing: Total assistance Lower Body Dressing Details (indicate cue type and reason): Pt unable to follow commands and sequence donning of socks.             Functional mobility during ADLs: Maximal assistance;+2 for physical assistance General ADL Comments: Requiring Max-Total A for ADLs due to cognitive deficits.     Vision         Perception     Praxis      Pertinent Vitals/Pain Pain Assessment: Faces Faces Pain Scale: No hurt Breathing: normal Negative Vocalization: none  Facial Expression: smiling or inexpressive Body Language: relaxed Consolability: no need to console PAINAD Score: 0 Pain Intervention(s): Monitored during session     Hand Dominance     Extremity/Trunk Assessment Upper Extremity Assessment Upper Extremity Assessment: Difficult to assess due to impaired cognition   Lower Extremity Assessment Lower Extremity Assessment:  Difficult to assess due to impaired cognition   Cervical / Trunk Assessment Cervical / Trunk Assessment: Kyphotic   Communication Communication Communication: No difficulties (but noted impaired comprehension vs word finding difficulties, perseverated on laughing in response to questions or the answer "no not really")   Cognition Arousal/Alertness: Awake/alert (pt was asleep, didn't maintain eyes opened until pt up at EOB and amb) Behavior During Therapy: Flat affect Overall Cognitive Status: History of cognitive impairments - at baseline                                 General Comments: pt with h/o dementia, pt only oriented to self, pt states "nope" to every question asked, per chart pt doesn't recognize family, Pt followed simple commands 50% of time, better with UEs vs LEs     General Comments  VSS    Exercises     Shoulder Instructions      Home Living Family/patient expects to be discharged to:: Skilled nursing facility (memory care unit)                                 Additional Comments: pt resides at [facility]      Prior Functioning/Environment Prior Level of Function : Other (comment);Patient poor historian/Family not available (per chart)             Mobility Comments: Per chart, son reports he was walking without AD or assist but was unsteady, as noted by h/o freq falls ADLs Comments: suspect pt requires some assist due to severely impaired cognition        OT Problem List: Decreased strength;Decreased range of motion;Decreased activity tolerance;Impaired balance (sitting and/or standing);Decreased safety awareness;Decreased knowledge of use of DME or AE;Decreased cognition;Decreased knowledge of precautions      OT Treatment/Interventions: Self-care/ADL training;Therapeutic exercise;Energy conservation;DME and/or AE instruction;Therapeutic activities;Patient/family education    OT Goals(Current goals can be found in the  care plan section) Acute Rehab OT Goals Patient Stated Goal: Unstated OT Goal Formulation: Patient unable to participate in goal setting Time For Goal Achievement: 01/01/21 Potential to Achieve Goals: Fair ADL Goals Pt Will Perform Grooming: with mod assist;sitting (Mod cues) Pt Will Perform Lower Body Dressing: with mod assist;sit to/from stand Pt Will Transfer to Toilet: with min assist;ambulating;regular height toilet Additional ADL Goal #1: Pt will follow one step commands during ADLs and mobility with Min cues  OT Frequency: Min 2X/week   Barriers to D/C:            Co-evaluation PT/OT/SLP Co-Evaluation/Treatment: Yes Reason for Co-Treatment: For patient/therapist safety;To address functional/ADL transfers PT goals addressed during session: Mobility/safety with mobility OT goals addressed during session: ADL's and self-care      AM-PAC OT "6 Clicks" Daily Activity     Outcome Measure Help from another person eating meals?: Total Help from another person taking care of personal grooming?: Total Help from another person toileting, which includes using toliet, bedpan, or urinal?: Total Help from another person bathing (including washing, rinsing, drying)?: Total Help from another person to put  on and taking off regular upper body clothing?: Total Help from another person to put on and taking off regular lower body clothing?: Total 6 Click Score: 6   End of Session Equipment Utilized During Treatment: Gait belt Nurse Communication: Mobility status  Activity Tolerance: Patient tolerated treatment well Patient left: in chair;with call bell/phone within reach;with chair alarm set;with restraints reapplied  OT Visit Diagnosis: Unsteadiness on feet (R26.81);Other abnormalities of gait and mobility (R26.89);Muscle weakness (generalized) (M62.81)                Time: 3838-1840 OT Time Calculation (min): 23 min Charges:  OT General Charges $OT Visit: 1 Visit OT Evaluation $OT  Eval Moderate Complexity: 1 Mod  Mackie Goon MSOT, OTR/L Acute Rehab Pager: [phone] Office: [phone]  Theodoro Grist Thorne Wirz 12/18/2020, 11:32 AM

## 2020-12-18 NOTE — Consult Note (Signed)
CC: SDH  HPI:     Patient is a 80 y.o. male with dementia who presented to the ER after several falls at his facility.  He was found to have a large subacute to chronic left SDH with mass effect.  He states he has some headache, but history-taking was limited.    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   Aortic atherosclerosis (HCC) by CXR in 2018 04/19/2019   Moderate dementia with behavioral disturbance 11/11/2018   Former smoker 01/12/2018   Encounter for Medicare annual wellness exam 03/19/2017   Chronic obstructive pulmonary disease (HCC) 09/10/2016   Glaucoma 10/19/2014   OSA (obstructive sleep apnea) 10/19/2014   BMI 23.0-23.9, adult 10/19/2014   Medication management 07/01/2013   Essential hypertension 12/29/2012   Hyperlipidemia, mixed 12/29/2012   Abnormal glucose 12/29/2012   Vitamin D deficiency 12/29/2012   Past Medical History:  Diagnosis Date   Glaucoma    OSA (obstructive sleep apnea)    RLS (restless legs syndrome)    Vitamin D deficiency     Past Surgical History:  Procedure Laterality Date   axillary Right 1986   biopsy of axillary nodes   PILONIDAL CYST / SINUS EXCISION  1959    Medications Prior to Admission  Medication Sig Dispense Refill Last Dose   calcium carbonate (OS-CAL) 600 MG tablet Takes 1 tab / day      Cholecalciferol (VITAMIN D) 2000 units CAPS Takes 1 cap 2 x / day      Ciclopirox 1 % shampoo 1 Application daily to scalp and leave for 3-5 min 120 mL 2    clotrimazole (LOTRIMIN) 1 % cream Apply 1 application topically 2 (two) times daily. 30 g 0    ezetimibe (ZETIA) 10 MG tablet Take 1 tablet Daily for Cholesterol 90 tablet 3    gemfibrozil (LOPID) 600 MG tablet Take 1 tablet 2 x /day with Meals for Triglycerides (Blood Fats) 180 tablet 1    ketoconazole (NIZORAL) 2 % cream APPLY TOPICALLY TO THE  AFFECTED AREA TWICE DAILY 60 g 3    latanoprost (XALATAN) 0.005 % ophthalmic solution Place 1 drop into both eyes at bedtime.       loratadine (CLARITIN) 10 MG tablet Take 10 mg by mouth daily.      Magnesium 250 MG TABS Takes 1 tab / day  0    mirtazapine (REMERON) 30 MG tablet Take 1/2 to 1 tablet 1 hour before Bedtime for Appetite & Sleep 90 tablet 1    Omega-3 Fatty Acids (FISH OIL) 1000 MG CAPS Take 1 capsule (1,000 mg total) by mouth 2 (two) times daily.  0    rosuvastatin (CRESTOR) 10 MG tablet Take 1 tablet Daily for Cholesterol 90 tablet 3    triamcinolone ointment (KENALOG) 0.1 % Apply 1 application topically 2 (two) times daily. To face rash for 1-2 weeks then stop. 80 g 0    TURMERIC PO Take by mouth daily.      Allergies  Allergen Reactions   Benadryl [Diphenhydramine Hcl] Other (See Comments)    Hallucinations, itching, shaking    Social History   Tobacco Use   Smoking status: Former    Types: Cigarettes    Quit date: 05/10/2009    Years since quitting: 11.6   Smokeless tobacco: Never  Substance Use Topics   Alcohol use: Yes  Alcohol/week: 0.0 standard drinks    Family History  Problem Relation Age of Onset   Hypertension Mother    Diabetes Mother    Hypertension Father    Cancer Father      Review of Systems Review of systems not obtained due to patient factors.  Objective:   Patient Vitals for the past 8 hrs:  BP Temp Temp src Pulse Resp SpO2  12/18/20 0700 98/72 (!) 97.2 F (36.2 C) Oral -- -- --  12/18/20 0355 (!) 143/91 (!) 97.1 F (36.2 C) Axillary 81 18 99 %   I/O last 3 completed shifts: In: 98.2 [IV Piggyback:98.2] Out: -  No intake/output data recorded.      General : Alert, cooperative,   Head:  Normocephalic/atraumatic    Eyes: PERRL, conjunctiva/corneas clear, EOM's intact. Fundi could not be visualized Neck: Supple Chest:  Respirations unlabored Chest wall: no tenderness or deformity Heart: Regular rate and rhythm Abdomen:  Soft, nontender and nondistended Extremities: warm and well-perfused Skin: normal turgor, color and texture Neurologic:  Alert, oriented to person only.  Inappropriate laughter, poor insight..  Eyes open spontaneously. PERRL, EOMI, VFC, no facial droop. V1-3 intact.  No dysarthria, tongue protrusion symmetric.  CNII-XII intact. Hands in mitts.  Does not cooperate with neurologic exam.       Data ReviewCBC:  Lab Results  Component Value Date   WBC 6.2 12/18/2020   RBC 3.30 (L) 12/18/2020   BMP:  Lab Results  Component Value Date   GLUCOSE 83 12/18/2020   CO2 24 12/18/2020   BUN 18 12/18/2020   CREATININE 0.77 12/18/2020   CREATININE 0.88 09/26/2020   CALCIUM 8.4 (L) 12/18/2020   Radiology review:  Large left subacute-to-chronic SDH on CT.  Small bilateral chronic SDHs seen on CT from 11/2020.  Assessment:   Active Problems:   Essential hypertension   Hyperlipidemia, mixed   Chronic obstructive pulmonary disease (HCC)   Moderate dementia with behavioral disturbance   Subdural hematoma   Fall   Plan:  80 yo M with dementia with Large left subacute-to-chronic SDH - SDH drainage may help with any recent worsening of his neurologic status-- he had small bilateral SDHs in October.  Any recent decline may be partially reversible.  However, as he is DNR/DNI with moderate amount of baseline dementia, palliative care would be a reasonable option. Will discuss with his son.

## 2020-12-18 NOTE — Progress Notes (Signed)
Patient has been anxious and trying to get out of the bed. He has a posey and bilateral wrist restraints. I asked the doctor for something for anxiety and he was prescribed haldol.

## 2020-12-18 NOTE — Progress Notes (Signed)
I spoke to the son at length via phone.  Cameron Mcclain has advanced dementia, unable to recognize family members for several years.  His son did notes he was doing fairly well several weeks ago when he saw him, so he likely has had some recent decline which may be attributable to his large subdural hematoma.  I discussed with him that in  Cameron Mcclain's case, it may be appropriate to pursue palliative care rather than aggressive surgical intervention.  One could expect surgery may help him get back to his neurologic status  that he possessed in September, but he certainly is at risk of subdural recurrence and improvement is no guarantee.  Cameron Mcclain son did requested talk with the palliative care team as well as with his mother and he will come to a decision tomorrow.

## 2020-12-18 NOTE — Progress Notes (Addendum)
PROGRESS NOTE    Cameron Mcclain  ZOX:[id number] DOB: Jul 15, 1940 DOA: 12/17/2020 PCP: Lucky Cowboy, MD  Brief Narrative: 80/M with history of advanced dementia, resides in a memory care unit, does not recognize family, mostly confused at baseline, history of hypertension, dyslipidemia, was brought to the emergency room after 2 falls. 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 Keppra -Will request palliative care evaluation -Was unable to reach his son, wife reports that dementia is advanced, does not recognize family -PT eval  Advanced dementia -Resides in memory care unit, confused, does not recognize family, reportedly able to ambulate per spouse -Remains confused, in restraints -Delirium precautions  Essential hypertension -BP stable, not on meds  COPD -Stable  Mild right leg swelling -Follow-up Dopplers -Would not be appropriate for anticoagulation and if he has DVT  Multiple falls -PT eval  DVT prophylaxis: SCDs Code Status: DNR Family Communication: Was unable to reach son, called and updated wife Disposition Plan:  Status is: Inpatient  Remains inpatient appropriate because: Severity of illness   Consultants:  Neurosurgery  Procedures:   Antimicrobials:    Subjective: -Confused, trying to get out of bed, not in restraints  Objective: Vitals:   12/17/20 2312 12/17/20 2357 12/18/20 0355 12/18/20 0700  BP:  126/71 (!) 143/91 98/72  Pulse:  75 81   Resp:  16 18   Temp: 98 F (36.7 C) (!) 96.7 F (35.9 C) (!) 97.1 F (36.2 C) (!) 97.2 F (36.2 C)  TempSrc: Oral Axillary Axillary Oral  SpO2:  95% 99%     Intake/Output Summary (Last 24 hours) at 12/18/2020 1027 Last data filed at 12/17/2020 2308 Gross per 24 hour  Intake 98.24 ml  Output  --  Net 98.24 ml   There were no vitals filed for this visit.  Examination:  General exam: Pleasantly confused, elderly male laying in bed, awake, alert, oriented to self only, restraints on Respiratory system: Clear to auscultation Cardiovascular system: S1 & S2 heard, RRR.  Abd: nondistended, soft and nontender.Normal bowel sounds heard. Central nervous system: Confused, moves all extremities, does not follow commands consistently Extremities: no edema Skin: No rashes Psychiatry: Poor insight and judgment    Data Reviewed:   CBC: Recent Labs  Lab 12/17/20 2010 12/18/20 0302  WBC 7.4 6.2  NEUTROABS  --  3.5  HGB 11.5* 10.9*  HCT 33.5* 32.3*  MCV 96.5 97.9  PLT 225 208   Basic Metabolic Panel: Recent Labs  Lab 12/17/20 2010 12/18/20 0302  NA 135 134*  K 4.4 3.7  CL 101 104  CO2 26 24  GLUCOSE 93 83  BUN 26* 18  CREATININE 0.89 0.77  CALCIUM 9.0 8.4*  MG  --  1.9  PHOS  --  2.9   GFR: CrCl cannot be calculated (Unknown ideal weight.). Liver Function Tests: Recent Labs  Lab 12/17/20 2010 12/18/20 0302  AST 28 25  ALT 17 18  ALKPHOS 61 55  BILITOT 0.6 0.6  PROT 6.6 5.8*  ALBUMIN 3.8 3.0*   No results for input(s): LIPASE, AMYLASE in the last 168 hours. No results for input(s): AMMONIA in the last 168 hours. Coagulation Profile: Recent Labs  Lab 12/17/20 2010  INR 1.0   Cardiac Enzymes: No results for input(s): CKTOTAL, CKMB, CKMBINDEX, TROPONINI in the last 168  hours. BNP (last 3 results) No results for input(s): PROBNP in the last 8760 hours. HbA1C: No results for input(s): HGBA1C in the last 72 hours. CBG: No results for input(s): GLUCAP in the last 168 hours. Lipid Profile: No results for input(s): CHOL, HDL, LDLCALC, TRIG, CHOLHDL, LDLDIRECT in the last 72 hours. Thyroid Function Tests: Recent Labs    12/18/20 0302  TSH 0.927   Anemia Panel: No results for input(s): VITAMINB12, FOLATE, FERRITIN, TIBC, IRON, RETICCTPCT in the last  72 hours. Urine analysis:    Component Value Date/Time   COLORURINE YELLOW 12/18/2020 0056   APPEARANCEUR CLEAR 12/18/2020 0056   LABSPEC 1.018 12/18/2020 0056   PHURINE 5.0 12/18/2020 0056   GLUCOSEU NEGATIVE 12/18/2020 0056   HGBUR NEGATIVE 12/18/2020 0056   BILIRUBINUR NEGATIVE 12/18/2020 0056   KETONESUR 5 (A) 12/18/2020 0056   PROTEINUR NEGATIVE 12/18/2020 0056   UROBILINOGEN 0.2 05/03/2013 1644   NITRITE NEGATIVE 12/18/2020 0056   LEUKOCYTESUR NEGATIVE 12/18/2020 0056   Sepsis Labs: (procalcitonin:4,lacticidven:4)  ) Recent Results (from the past 240 hour(s))  Resp Panel by RT-PCR (Flu A&B, Covid) Nasopharyngeal Swab     Status: None   Collection Time: 12/17/20  8:10 PM   Specimen: Nasopharyngeal Swab; Nasopharyngeal(NP) swabs in vial transport medium  Result Value Ref Range Status   SARS Coronavirus 2 by RT PCR NEGATIVE NEGATIVE Final    Comment: (NOTE) SARS-CoV-2 target nucleic acids are NOT DETECTED.  The SARS-CoV-2 RNA is generally detectable in upper respiratory specimens during the acute phase of infection. The lowest concentration of SARS-CoV-2 viral copies this assay can detect is 138 copies/mL. A negative result does not preclude SARS-Cov-2 infection and should not be used as the sole basis for treatment or other patient management decisions. A negative result may occur with  improper specimen collection/handling, submission of specimen other than nasopharyngeal swab, presence of viral mutation(s) within the areas targeted by this assay, and inadequate number of viral copies(<138 copies/mL). A negative result must be combined with clinical observations, patient history, and epidemiological information. The expected result is Negative.  Fact Sheet for Patients:  BloggerCourse.com  Fact Sheet for Healthcare Providers:  SeriousBroker.it  This test is no t yet approved or cleared by the [place] FDA and  has been authorized for detection and/or diagnosis of SARS-CoV-2 by FDA under an Emergency Use Authorization (EUA). This EUA will remain  in effect (meaning this test can be used) for the duration of the COVID-19 declaration under Section 564(b)(1) of the Act, 21 U.S.C.section 360bbb-3(b)(1), unless the authorization is terminated  or revoked sooner.       Influenza A by PCR NEGATIVE NEGATIVE Final   Influenza B by PCR NEGATIVE NEGATIVE Final    Comment: (NOTE) The Xpert Xpress SARS-CoV-2/FLU/RSV plus assay is intended as an aid in the diagnosis of influenza from Nasopharyngeal swab specimens and should not be used as a sole basis for treatment. Nasal washings and aspirates are unacceptable for Xpert Xpress SARS-CoV-2/FLU/RSV testing.  Fact Sheet for Patients: BloggerCourse.com  Fact Sheet for Healthcare Providers: SeriousBroker.it  This test is not yet approved or cleared by the [place] FDA and has been authorized for detection and/or diagnosis of SARS-CoV-2 by FDA under an Emergency Use Authorization (EUA). This EUA will remain in effect (meaning this test can be used) for the duration of the COVID-19 declaration under Section 564(b)(1) of the Act, 21 U.S.C. section 360bbb-3(b)(1), unless the authorization is terminated or revoked.  Performed at [facility],  [address], [place], [place] [address]          Radiology Studies: DG Elbow Complete Right  Result Date: 12/17/2020 CLINICAL DATA:  Unwitnessed fall. EXAM: RIGHT ELBOW - COMPLETE 3+ VIEW COMPARISON:  None. FINDINGS: There is no evidence of fracture, dislocation, or joint effusion. Mild degenerative changes spurring. Tiny olecranon spur soft tissues are unremarkable. IMPRESSION: No fracture or subluxation of the right elbow. Mild degenerative change. Electronically Signed   By: Narda Rutherford M.D.   On: 12/17/2020 21:06    CT HEAD WO CONTRAST  Result Date: 12/17/2020 CLINICAL DATA:  Initial evaluation for acute head trauma, fall. EXAM: CT HEAD WITHOUT CONTRAST CT CERVICAL SPINE WITHOUT CONTRAST TECHNIQUE: Multidetector CT imaging of the head and cervical spine was performed following the standard protocol without intravenous contrast. Multiplanar CT image reconstructions of the cervical spine were also generated. COMPARISON:  Prior head CT from 11/10/2020. FINDINGS: CT HEAD FINDINGS Brain: There has been interval development of a fairly large subdural hematoma on the left, largely isodense to adjacent brain and subacute in appearance, although a small amount of more hyperdense blood products is seen within this collection. Collection measures up to approximately 3 cm in diameter. Associated mass effect on the subjacent left cerebral hemisphere with up to 1 cm of left-to-right shift. Additionally, there is a smaller largely subacute appearing extra-axial collection overlying the right cerebral convexity measuring up to 7 mm in thickness (series 4, image 32), likely subdural as well. No significant mass effect. There is partial effacement of the left lateral ventricle with asymmetric dilatation of the right lateral ventricle, slightly increased from previous exam, which could reflect a degree of mild trapping. Underlying atrophy with chronic small vessel ischemic disease. No acute large vessel territory infarct. No mass lesion. Vascular: No hyperdense vessel. Calcified atherosclerosis present at skull base. Skull: Small soft tissue contusions about the forehead, left greater than right. Calvarium intact. Sinuses/Orbits: Globes and orbital soft tissues demonstrate no acute finding. Paranasal sinuses are clear. No mastoid effusion. Other: None. CT CERVICAL SPINE FINDINGS Alignment: Straightening of the normal cervical lordosis. Trace anterolisthesis of C4 on C5, C5 on C6, and C6 on C7, likely chronic and degenerative. Skull base  and vertebrae: Skull base intact. Normal C1-2 articulations are preserved. Dens is intact. Vertebral body height maintained. No acute fracture. Soft tissues and spinal canal: Paraspinous soft tissues demonstrate no acute finding. No abnormal prevertebral edema. Spinal canal demonstrates no acute finding. Vascular calcifications about the carotid bifurcations. Disc levels: Mild-to-moderate multilevel cervical spondylosis with mild spinal stenosis at C5-6. Upper chest: Visualized upper chest demonstrates no acute finding. Partially visualized lung apices are grossly clear. Other: None. IMPRESSION: CT BRAIN: 1. Interval development of a fairly large left subdural hematoma measuring up to 3 cm, largely subacute in nature. Mass effect on the subjacent left cerebral hemisphere with up to 1 cm left-to-right shift. Mild dilatation of the right lateral ventricle as compared to previous exam, suggesting mild and/or early trapping. 2. Smaller largely subacute appearing right extra-axial collection measuring up to 7 mm in thickness, also likely subdural. No associated mass effect. 3. Small soft tissue contusions about the forehead, left greater than right. No calvarial fracture. 4. Underlying atrophy with chronic small vessel ischemic disease. CT CERVICAL SPINE: 1. No acute traumatic injury within the cervical spine. 2. Mild-to-moderate multilevel cervical spondylosis with mild spinal stenosis at C5-6. Critical Value/emergent results were called by telephone at the time of interpretation on 12/17/2020 at 9:26 pm to provider [facility]  LAWSING , who verbally acknowledged these results. Electronically Signed   By: Rise Mu M.D.   On: 12/17/2020 21:47   CT CERVICAL SPINE WO CONTRAST  Result Date: 12/17/2020 CLINICAL DATA:  Initial evaluation for acute head trauma, fall. EXAM: CT HEAD WITHOUT CONTRAST CT CERVICAL SPINE WITHOUT CONTRAST TECHNIQUE: Multidetector CT imaging of the head and cervical spine was performed  following the standard protocol without intravenous contrast. Multiplanar CT image reconstructions of the cervical spine were also generated. COMPARISON:  Prior head CT from 11/10/2020. FINDINGS: CT HEAD FINDINGS Brain: There has been interval development of a fairly large subdural hematoma on the left, largely isodense to adjacent brain and subacute in appearance, although a small amount of more hyperdense blood products is seen within this collection. Collection measures up to approximately 3 cm in diameter. Associated mass effect on the subjacent left cerebral hemisphere with up to 1 cm of left-to-right shift. Additionally, there is a smaller largely subacute appearing extra-axial collection overlying the right cerebral convexity measuring up to 7 mm in thickness (series 4, image 32), likely subdural as well. No significant mass effect. There is partial effacement of the left lateral ventricle with asymmetric dilatation of the right lateral ventricle, slightly increased from previous exam, which could reflect a degree of mild trapping. Underlying atrophy with chronic small vessel ischemic disease. No acute large vessel territory infarct. No mass lesion. Vascular: No hyperdense vessel. Calcified atherosclerosis present at skull base. Skull: Small soft tissue contusions about the forehead, left greater than right. Calvarium intact. Sinuses/Orbits: Globes and orbital soft tissues demonstrate no acute finding. Paranasal sinuses are clear. No mastoid effusion. Other: None. CT CERVICAL SPINE FINDINGS Alignment: Straightening of the normal cervical lordosis. Trace anterolisthesis of C4 on C5, C5 on C6, and C6 on C7, likely chronic and degenerative. Skull base and vertebrae: Skull base intact. Normal C1-2 articulations are preserved. Dens is intact. Vertebral body height maintained. No acute fracture. Soft tissues and spinal canal: Paraspinous soft tissues demonstrate no acute finding. No abnormal prevertebral edema.  Spinal canal demonstrates no acute finding. Vascular calcifications about the carotid bifurcations. Disc levels: Mild-to-moderate multilevel cervical spondylosis with mild spinal stenosis at C5-6. Upper chest: Visualized upper chest demonstrates no acute finding. Partially visualized lung apices are grossly clear. Other: None. IMPRESSION: CT BRAIN: 1. Interval development of a fairly large left subdural hematoma measuring up to 3 cm, largely subacute in nature. Mass effect on the subjacent left cerebral hemisphere with up to 1 cm left-to-right shift. Mild dilatation of the right lateral ventricle as compared to previous exam, suggesting mild and/or early trapping. 2. Smaller largely subacute appearing right extra-axial collection measuring up to 7 mm in thickness, also likely subdural. No associated mass effect. 3. Small soft tissue contusions about the forehead, left greater than right. No calvarial fracture. 4. Underlying atrophy with chronic small vessel ischemic disease. CT CERVICAL SPINE: 1. No acute traumatic injury within the cervical spine. 2. Mild-to-moderate multilevel cervical spondylosis with mild spinal stenosis at C5-6. Critical Value/emergent results were called by telephone at the time of interpretation on 12/17/2020 at 9:26 pm to provider [name] , who verbally acknowledged these results. Electronically Signed   By: Rise Mu M.D.   On: 12/17/2020 21:47   DG Pelvis Portable  Result Date: 12/17/2020 CLINICAL DATA:  Fall. EXAM: PORTABLE PELVIS 1-2 VIEWS COMPARISON:  11/10/2020 FINDINGS: Diffuse bony under mineralization. The cortical margins of the bony pelvis are intact. No fracture. Pubic symphysis and sacroiliac joints are congruent. Both  femoral heads are well-seated in the respective acetabula. IMPRESSION: No pelvic fracture. Electronically Signed   By: Narda Rutherford M.D.   On: 12/17/2020 21:05   DG Chest Port 1 View  Result Date: 12/17/2020 CLINICAL DATA:  Fall.  EXAM: PORTABLE CHEST 1 VIEW COMPARISON:  Radiograph 11/10/2020 FINDINGS: Again seen hyperinflation with interstitial coarsening.The cardiomediastinal contours are normal. The lungs are clear. Pulmonary vasculature is normal. No consolidation, pleural effusion, or pneumothorax. No acute osseous abnormalities are seen. IMPRESSION: 1. No acute findings. 2. Chronic hyperinflation and interstitial coarsening. Electronically Signed   By: Narda Rutherford M.D.   On: 12/17/2020 21:04        Scheduled Meds:  latanoprost  1 drop Both Eyes QHS   loratadine  10 mg Oral Daily   rosuvastatin  10 mg Oral Daily   Continuous Infusions:  sodium chloride 75 mL/hr (12/18/20 0151)   levETIRAcetam 500 mg (12/18/20 0825)     LOS: 1 day    Time spent:    Zannie Cove, MD [facility]   12/18/2020, 10:27 AM

## 2020-12-18 NOTE — Evaluation (Signed)
Physical Therapy Evaluation Patient Details Name: Cameron Mcclain MRN: [id number] DOB: 05/08/1940 Today's Date: 12/18/2020  History of Present Illness  Pt is a 80yo male who came to ED s/p 2 falls at his facility. Pt found to have an acute SDH in additio to schornic subactue/chornic SDH. PMH: advanced dementia, falls, HTN, HLD [facility]   Clinical Impression  Pt with advanced dementia limiting ability to comprehend tasks asked,remains only oriented to self, only 50% simple command follow, decreased insight to safety and deficits, and also demo's significant falls risk as pt requires assist for all mobility. Pt requires assist for ALL MOBILITY as pt with decreased ability to use RW safely due to impaired cognition and ability to process. Acute PT to cont to follow.     Recommendations for follow up therapy are one component of a multi-disciplinary discharge planning process, led by the attending physician.  Recommendations may be updated based on patient status, additional functional criteria and insurance authorization.  Follow Up Recommendations Skilled nursing-short term rehab (<3 hours/day) (pt can return to [facility] if pt can receive assist for all mobility as pt with freq falls and is very unsteady)    Assistance Recommended at Discharge Frequent or constant Supervision/Assistance  Functional Status Assessment Patient has had a recent decline in their functional status and demonstrates the ability to make significant improvements in function in a reasonable and predictable amount of time.  Equipment Recommendations  None recommended by PT (TBD at next venue)    Recommendations for Other Services       Precautions / Restrictions Precautions Precautions: Fall Precaution Comments: pt with h/o freq falls Restrictions Weight Bearing Restrictions: No      Mobility  Bed Mobility Overal bed mobility: Needs Assistance Bed Mobility: Supine to Sit     Supine  to sit: Mod assist;HOB elevated     General bed mobility comments: pt requireing max verbal and tactile cues to complete task, modA to scoot hips to EOB and elevate trunk    Transfers Overall transfer level: Needs assistance Equipment used: 2 person hand held assist Transfers: Sit to/from Stand Sit to Stand: Mod assist;+2 safety/equipment           General transfer comment: max verbal and tactile cues to complete, pt did initiate via pushing up with hands    Ambulation/Gait Ambulation/Gait assistance: Mod assist;+2 physical assistance;+2 safety/equipment Gait Distance (Feet): 15 Feet Assistive device: 2 person hand held assist Gait Pattern/deviations: Step-to pattern;Shuffle;Narrow base of support Gait velocity: slow Gait velocity interpretation: <1.8 ft/sec, indicate of risk for recurrent falls   General Gait Details: pt requiring tactile cues to weight shift and progress forward to promote stepping pattern, once pt began amb pt would complete shuffling pattern and then stop s/p 2 feet requiring constant verbal and tactile cues to amb 15' in room  Stairs            Wheelchair Mobility    Modified Rankin (Stroke Patients Only) Modified Rankin (Stroke Patients Only) Pre-Morbid Rankin Score: Moderate disability Modified Rankin: Moderately severe disability     Balance Overall balance assessment: Needs assistance Sitting-balance support: Feet supported;No upper extremity supported Sitting balance-Leahy Scale: Fair Sitting balance - Comments: worked with OT on LB dressing in sitting requiring minA to prevent falls   Standing balance support: Bilateral upper extremity supported Standing balance-Leahy Scale: Poor Standing balance comment: dependent on physical assist  Pertinent Vitals/Pain Pain Assessment: Faces Faces Pain Scale: No hurt    Home Living Family/patient expects to be discharged to:: Skilled nursing facility  (memory care unit)                   Additional Comments: pt resides at [facility]    Prior Function Prior Level of Function : Other (comment);Patient poor historian/Family not available (per chart)             Mobility Comments: per chart, son reports he was walking without AD or assist but was unsteady, as noted by h/o freq falls ADLs Comments: suspect pt requires some assist due to severely impaired cognition     Hand Dominance        Extremity/Trunk Assessment   Upper Extremity Assessment Upper Extremity Assessment: Difficult to assess due to impaired cognition (uses functionally)    Lower Extremity Assessment Lower Extremity Assessment: Difficult to assess due to impaired cognition (able to amb and hold LAQ when placed in extension)    Cervical / Trunk Assessment Cervical / Trunk Assessment: Kyphotic  Communication   Communication: No difficulties (but noted impaired comprehension vs word finding difficulties, perseverated on laughing in response to questions or the answer "no not really")  Cognition Arousal/Alertness: Awake/alert (pt was asleep, didn't maintain eyes opened until pt up at EOB and amb) Behavior During Therapy: Flat affect Overall Cognitive Status: History of cognitive impairments - at baseline                                 General Comments: pt with h/o dementia, pt only oriented to self, pt states "nope" to every question asked, per chart pt doesn't recognize family, Pt followed simple commands 50% of time, better with UEs vs LEs        General Comments General comments (skin integrity, edema, etc.): pt with noted bilat LE edema, R worse than L    Exercises     Assessment/Plan    PT Assessment Patient needs continued PT services  PT Problem List Decreased strength;Decreased activity tolerance;Decreased balance;Decreased coordination;Decreased knowledge of use of DME;Decreased safety awareness;Decreased  mobility;Decreased cognition       PT Treatment Interventions DME instruction;Gait training;Stair training;Functional mobility training;Therapeutic activities;Therapeutic exercise;Balance training    PT Goals (Current goals can be found in the Care Plan section)  Acute Rehab PT Goals Patient Stated Goal: didn't stae PT Goal Formulation: Patient unable to participate in goal setting Time For Goal Achievement: 01/01/21 Potential to Achieve Goals: Fair    Frequency Min 2X/week   Barriers to discharge        Co-evaluation PT/OT/SLP Co-Evaluation/Treatment: Yes Reason for Co-Treatment: To address functional/ADL transfers PT goals addressed during session: Mobility/safety with mobility         AM-PAC PT "6 Clicks" Mobility  Outcome Measure Help needed turning from your back to your side while in a flat bed without using bedrails?: A Lot Help needed moving from lying on your back to sitting on the side of a flat bed without using bedrails?: A Lot Help needed moving to and from a bed to a chair (including a wheelchair)?: A Lot Help needed standing up from a chair using your arms (e.g., wheelchair or bedside chair)?: A Lot Help needed to walk in hospital room?: A Lot Help needed climbing 3-5 steps with a railing? : Total 6 Click Score: 11    End of Session Equipment  Utilized During Treatment:  (posey belt) Activity Tolerance: Patient tolerated treatment well Patient left: in chair;with call bell/phone within reach;with chair alarm set;with restraints reapplied (bilat mittens and posey belt also applied to pt while in chair) Nurse Communication: Mobility status PT Visit Diagnosis: Other abnormalities of gait and mobility (R26.89);Muscle weakness (generalized) (M62.81);Difficulty in walking, not elsewhere classified (R26.2)    Time: 4854-6270 PT Time Calculation (min) (ACUTE ONLY): 23 min   Charges:   PT Evaluation $PT Eval Moderate Complexity: 1 Mod          Lewis Shock, PT, DPT Acute Rehabilitation Services Pager #: [phone] Office #: [phone]   Iona Hansen 12/18/2020, 9:26 AM

## 2020-12-19 DIAGNOSIS — I6202 Nontraumatic subacute subdural hemorrhage: Secondary | ICD-10-CM | POA: Diagnosis not present

## 2020-12-19 DIAGNOSIS — S065XAA Traumatic subdural hemorrhage with loss of consciousness status unknown, initial encounter: Secondary | ICD-10-CM | POA: Diagnosis not present

## 2020-12-19 DIAGNOSIS — F03B18 Unspecified dementia, moderate, with other behavioral disturbance: Secondary | ICD-10-CM | POA: Diagnosis not present

## 2020-12-19 DIAGNOSIS — J449 Chronic obstructive pulmonary disease, unspecified: Secondary | ICD-10-CM | POA: Diagnosis not present

## 2020-12-19 DIAGNOSIS — Z515 Encounter for palliative care: Secondary | ICD-10-CM

## 2020-12-19 DIAGNOSIS — Z7189 Other specified counseling: Secondary | ICD-10-CM

## 2020-12-19 DIAGNOSIS — I6203 Nontraumatic chronic subdural hemorrhage: Secondary | ICD-10-CM | POA: Diagnosis not present

## 2020-12-19 DIAGNOSIS — M7989 Other specified soft tissue disorders: Secondary | ICD-10-CM | POA: Diagnosis not present

## 2020-12-19 MED ORDER — LORAZEPAM 1 MG PO TABS: 1.0000 mg | ORAL_TABLET | ORAL | Status: DC | PRN

## 2020-12-19 MED ORDER — LORAZEPAM 2 MG/ML IJ SOLN
1.0000 mg | INTRAMUSCULAR | Status: DC | PRN
  Administered 2020-12-19 (×2): 1 mg via INTRAVENOUS
  Filled 2020-12-19 (×2): qty 1

## 2020-12-19 MED ORDER — MORPHINE SULFATE (CONCENTRATE) 10 MG/0.5ML PO SOLN: 5.0000 mg | ORAL | Status: DC | PRN

## 2020-12-19 MED ORDER — OLANZAPINE 5 MG PO TBDP
5.0000 mg | ORAL_TABLET | Freq: Every day | ORAL | Status: DC
  Administered 2020-12-20: 5 mg via ORAL
  Filled 2020-12-19 (×2): qty 1

## 2020-12-19 MED ORDER — LORAZEPAM 2 MG/ML PO CONC: 1.0000 mg | ORAL | Status: DC | PRN

## 2020-12-19 MED ORDER — ACETAMINOPHEN 325 MG PO TABS
650.0000 mg | ORAL_TABLET | Freq: Three times a day (TID) | ORAL | Status: DC
  Administered 2020-12-19 – 2020-12-20 (×2): 650 mg via ORAL
  Filled 2020-12-19 (×2): qty 2

## 2020-12-19 NOTE — Progress Notes (Signed)
RN notified of family's wishes to proceed with comfort care status. Bilateral wrist restraints and posey belt removed. Ativan given. Pt resting comfortably with call bell within reach and bed in lowest position.

## 2020-12-19 NOTE — TOC Initial Note (Addendum)
Transition of Care [facility]) - Initial/Assessment Note    Patient Details  Name: Cameron Mcclain MRN: [id number] Date of Birth: 1940/12/29  Transition of Care [facility]) CM/SW Contact:    Lorri Frederick, LCSW Phone Number: 12/19/2020, 2:31 PM  Clinical Narrative: Pt unable to participate in assessment, CSW spoke with son [name] by phone.  [name] confirms plan made with palliative care team to return to memory care unit at [facility] with hospice support.  CSW discussed choice of hospice agencies, [name] would like to work with [facility].    CSW made to [facility] referral line, Chrislyn at [facility] will work on this referral.  CSW spoke with Era Skeen at [facility] care.  Direct: [phone].  Unsigned FL2 faxed to her at [phone].  Will need to confirm any changes and refax a copy signed by MD along with DC summary at discharge.  They are tentatively planning on tomorrow if everything can be set up.         1550: TC Latoya, no changes needed on FL2            Expected Discharge Plan: Memory Care Barriers to Discharge: No Barriers Identified   Patient Goals and CMS Choice        Expected Discharge Plan and Services Expected Discharge Plan: Memory Care In-house Referral: Clinical Social Work   Post Acute Care Choice: Hospice Living arrangements for the past 2 months: Assisted Living Facility                                      Prior Living Arrangements/Services Living arrangements for the past 2 months: Assisted Living Facility Lives with:: Facility Resident Patient language and need for interpreter reviewed:: No        Need for Family Participation in Patient Care: Yes (Comment) Care giver support system in place?: Yes (comment) Current home services: Other (comment) (na) Criminal Activity/Legal Involvement Pertinent to Current Situation/Hospitalization: No - Comment as needed  Activities of Daily Living      Permission  Sought/Granted                  Emotional Assessment Appearance:: Appears stated age Attitude/Demeanor/Rapport: Unable to Assess Affect (typically observed): Unable to Assess Orientation: : Oriented to Self, Oriented to Place, Oriented to Situation, Oriented to  Time Alcohol / Substance Use: Not Applicable Psych Involvement: No (comment)  Admission diagnosis:  Subdural hematoma [S06.5XAA] SDH (subdural hematoma) [S06.5XAA] Fall [W19.XXXA] 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   Aortic atherosclerosis (HCC) by CXR in 2018 04/19/2019   Moderate dementia with behavioral disturbance 11/11/2018   Former smoker 01/12/2018   Encounter for Medicare annual wellness exam 03/19/2017   Chronic obstructive pulmonary disease (HCC) 09/10/2016   Glaucoma 10/19/2014   OSA (obstructive sleep apnea) 10/19/2014   BMI 23.0-23.9, adult 10/19/2014   Medication management 07/01/2013   Essential hypertension 12/29/2012   Hyperlipidemia, mixed 12/29/2012   Abnormal glucose 12/29/2012   Vitamin D deficiency 12/29/2012   PCP:  Lucky Cowboy, MD Pharmacy:   [id number] - [place], [place] - [address] [place] [address] Phone: [phone] Fax: [phone]  [facility] [place], [place] - [address] [address] [place] [address]  Phone: [phone] Fax: [phone]     Social Determinants of Health (SDOH) Interventions    Readmission Risk Interventions No flowsheet data found.

## 2020-12-19 NOTE — Progress Notes (Signed)
Subjective: NAEs o/n  Objective: Vital signs in last 24 hours: Temp:  [97.2 F (36.2 C)-98.7 F (37.1 C)] 97.9 F (36.6 C) (11/15 0733) Pulse Rate:  [64-76] 64 (11/15 0344) Resp:  [13-16] 14 (11/15 0344) BP: (94-132)/(69-80) 113/72 (11/15 0344) SpO2:  [95 %-98 %] 95 % (11/15 0344)  Intake/Output from previous day: 11/14 0701 - 11/15 0700 In: 1060 [P.O.:860; IV Piggyback:200] Out: 1200 [Urine:1200] Intake/Output this shift: No intake/output data recorded.  NAD Appears comfortable in bed, drowsy Confused, but does state his name MAEs, unable to perform detailed testing  Lab Results: Recent Labs    12/17/20 2010 12/18/20 0302  WBC 7.4 6.2  HGB 11.5* 10.9*  HCT 33.5* 32.3*  PLT 225 208   BMET Recent Labs    12/17/20 2010 12/18/20 0302  NA 135 134*  K 4.4 3.7  CL 101 104  CO2 26 24  GLUCOSE 93 83  BUN 26* 18  CREATININE 0.89 0.77  CALCIUM 9.0 8.4*    Studies/Results: DG Elbow Complete Right  Result Date: 12/17/2020 CLINICAL DATA:  Unwitnessed fall. EXAM: RIGHT ELBOW - COMPLETE 3+ VIEW COMPARISON:  None. FINDINGS: There is no evidence of fracture, dislocation, or joint effusion. Mild degenerative changes spurring. Tiny olecranon spur soft tissues are unremarkable. IMPRESSION: No fracture or subluxation of the right elbow. Mild degenerative change. Electronically Signed   By: Narda Rutherford M.D.   On: 12/17/2020 21:06   CT HEAD WO CONTRAST  Result Date: 12/17/2020 CLINICAL DATA:  Initial evaluation for acute head trauma, fall. EXAM: CT HEAD WITHOUT CONTRAST CT CERVICAL SPINE WITHOUT CONTRAST TECHNIQUE: Multidetector CT imaging of the head and cervical spine was performed following the standard protocol without intravenous contrast. Multiplanar CT image reconstructions of the cervical spine were also generated. COMPARISON:  Prior head CT from 11/10/2020. FINDINGS: CT HEAD FINDINGS Brain: There has been interval development of a fairly large subdural hematoma on  the left, largely isodense to adjacent brain and subacute in appearance, although a small amount of more hyperdense blood products is seen within this collection. Collection measures up to approximately 3 cm in diameter. Associated mass effect on the subjacent left cerebral hemisphere with up to 1 cm of left-to-right shift. Additionally, there is a smaller largely subacute appearing extra-axial collection overlying the right cerebral convexity measuring up to 7 mm in thickness (series 4, image 32), likely subdural as well. No significant mass effect. There is partial effacement of the left lateral ventricle with asymmetric dilatation of the right lateral ventricle, slightly increased from previous exam, which could reflect a degree of mild trapping. Underlying atrophy with chronic small vessel ischemic disease. No acute large vessel territory infarct. No mass lesion. Vascular: No hyperdense vessel. Calcified atherosclerosis present at skull base. Skull: Small soft tissue contusions about the forehead, left greater than right. Calvarium intact. Sinuses/Orbits: Globes and orbital soft tissues demonstrate no acute finding. Paranasal sinuses are clear. No mastoid effusion. Other: None. CT CERVICAL SPINE FINDINGS Alignment: Straightening of the normal cervical lordosis. Trace anterolisthesis of C4 on C5, C5 on C6, and C6 on C7, likely chronic and degenerative. Skull base and vertebrae: Skull base intact. Normal C1-2 articulations are preserved. Dens is intact. Vertebral body height maintained. No acute fracture. Soft tissues and spinal canal: Paraspinous soft tissues demonstrate no acute finding. No abnormal prevertebral edema. Spinal canal demonstrates no acute finding. Vascular calcifications about the carotid bifurcations. Disc levels: Mild-to-moderate multilevel cervical spondylosis with mild spinal stenosis at C5-6. Upper chest: Visualized upper chest demonstrates  no acute finding. Partially visualized lung apices  are grossly clear. Other: None. IMPRESSION: CT BRAIN: 1. Interval development of a fairly large left subdural hematoma measuring up to 3 cm, largely subacute in nature. Mass effect on the subjacent left cerebral hemisphere with up to 1 cm left-to-right shift. Mild dilatation of the right lateral ventricle as compared to previous exam, suggesting mild and/or early trapping. 2. Smaller largely subacute appearing right extra-axial collection measuring up to 7 mm in thickness, also likely subdural. No associated mass effect. 3. Small soft tissue contusions about the forehead, left greater than right. No calvarial fracture. 4. Underlying atrophy with chronic small vessel ischemic disease. CT CERVICAL SPINE: 1. No acute traumatic injury within the cervical spine. 2. Mild-to-moderate multilevel cervical spondylosis with mild spinal stenosis at C5-6. Critical Value/emergent results were called by telephone at the time of interpretation on 12/17/2020 at 9:26 pm to provider [name] , who verbally acknowledged these results. Electronically Signed   By: Rise Mu M.D.   On: 12/17/2020 21:47   CT CERVICAL SPINE WO CONTRAST  Result Date: 12/17/2020 CLINICAL DATA:  Initial evaluation for acute head trauma, fall. EXAM: CT HEAD WITHOUT CONTRAST CT CERVICAL SPINE WITHOUT CONTRAST TECHNIQUE: Multidetector CT imaging of the head and cervical spine was performed following the standard protocol without intravenous contrast. Multiplanar CT image reconstructions of the cervical spine were also generated. COMPARISON:  Prior head CT from 11/10/2020. FINDINGS: CT HEAD FINDINGS Brain: There has been interval development of a fairly large subdural hematoma on the left, largely isodense to adjacent brain and subacute in appearance, although a small amount of more hyperdense blood products is seen within this collection. Collection measures up to approximately 3 cm in diameter. Associated mass effect on the subjacent left  cerebral hemisphere with up to 1 cm of left-to-right shift. Additionally, there is a smaller largely subacute appearing extra-axial collection overlying the right cerebral convexity measuring up to 7 mm in thickness (series 4, image 32), likely subdural as well. No significant mass effect. There is partial effacement of the left lateral ventricle with asymmetric dilatation of the right lateral ventricle, slightly increased from previous exam, which could reflect a degree of mild trapping. Underlying atrophy with chronic small vessel ischemic disease. No acute large vessel territory infarct. No mass lesion. Vascular: No hyperdense vessel. Calcified atherosclerosis present at skull base. Skull: Small soft tissue contusions about the forehead, left greater than right. Calvarium intact. Sinuses/Orbits: Globes and orbital soft tissues demonstrate no acute finding. Paranasal sinuses are clear. No mastoid effusion. Other: None. CT CERVICAL SPINE FINDINGS Alignment: Straightening of the normal cervical lordosis. Trace anterolisthesis of C4 on C5, C5 on C6, and C6 on C7, likely chronic and degenerative. Skull base and vertebrae: Skull base intact. Normal C1-2 articulations are preserved. Dens is intact. Vertebral body height maintained. No acute fracture. Soft tissues and spinal canal: Paraspinous soft tissues demonstrate no acute finding. No abnormal prevertebral edema. Spinal canal demonstrates no acute finding. Vascular calcifications about the carotid bifurcations. Disc levels: Mild-to-moderate multilevel cervical spondylosis with mild spinal stenosis at C5-6. Upper chest: Visualized upper chest demonstrates no acute finding. Partially visualized lung apices are grossly clear. Other: None. IMPRESSION: CT BRAIN: 1. Interval development of a fairly large left subdural hematoma measuring up to 3 cm, largely subacute in nature. Mass effect on the subjacent left cerebral hemisphere with up to 1 cm left-to-right shift. Mild  dilatation of the right lateral ventricle as compared to previous exam, suggesting mild and/or early  trapping. 2. Smaller largely subacute appearing right extra-axial collection measuring up to 7 mm in thickness, also likely subdural. No associated mass effect. 3. Small soft tissue contusions about the forehead, left greater than right. No calvarial fracture. 4. Underlying atrophy with chronic small vessel ischemic disease. CT CERVICAL SPINE: 1. No acute traumatic injury within the cervical spine. 2. Mild-to-moderate multilevel cervical spondylosis with mild spinal stenosis at C5-6. Critical Value/emergent results were called by telephone at the time of interpretation on 12/17/2020 at 9:26 pm to provider [name] , who verbally acknowledged these results. Electronically Signed   By: Rise Mu M.D.   On: 12/17/2020 21:47   DG Pelvis Portable  Result Date: 12/17/2020 CLINICAL DATA:  Fall. EXAM: PORTABLE PELVIS 1-2 VIEWS COMPARISON:  11/10/2020 FINDINGS: Diffuse bony under mineralization. The cortical margins of the bony pelvis are intact. No fracture. Pubic symphysis and sacroiliac joints are congruent. Both femoral heads are well-seated in the respective acetabula. IMPRESSION: No pelvic fracture. Electronically Signed   By: Narda Rutherford M.D.   On: 12/17/2020 21:05   DG Chest Port 1 View  Result Date: 12/17/2020 CLINICAL DATA:  Fall. EXAM: PORTABLE CHEST 1 VIEW COMPARISON:  Radiograph 11/10/2020 FINDINGS: Again seen hyperinflation with interstitial coarsening.The cardiomediastinal contours are normal. The lungs are clear. Pulmonary vasculature is normal. No consolidation, pleural effusion, or pneumothorax. No acute osseous abnormalities are seen. IMPRESSION: 1. No acute findings. 2. Chronic hyperinflation and interstitial coarsening. Electronically Signed   By: Narda Rutherford M.D.   On: 12/17/2020 21:04   VAS [place] LOWER EXTREMITY VENOUS (DVT)  Result Date: 12/18/2020  Lower Venous  DVT Study Patient Name:  Cameron Mcclain  Date of Exam:   12/18/2020 Medical Rec #: [id number]            Accession #:    [id number] Date of Birth: January 31, 1941            Patient Gender: M Patient Age:   18 years Exam Location:  [facility] Procedure:      VAS [place] LOWER EXTREMITY VENOUS (DVT) Referring Phys: Jonny Ruiz DOUTOVA --------------------------------------------------------------------------------  Indications: Edema.  Risk Factors: None identified. Limitations: Patient positioning, patient constant movement. Comparison Study: No prior studies. Performing Technologist: Chanda Busing RVT  Examination Guidelines: A complete evaluation includes B-mode imaging, spectral Doppler, color Doppler, and power Doppler as needed of all accessible portions of each vessel. Bilateral testing is considered an integral part of a complete examination. Limited examinations for reoccurring indications may be performed as noted. The reflux portion of the exam is performed with the patient in reverse Trendelenburg.  +---------+---------------+---------+-----------+----------+--------------+ RIGHT    CompressibilityPhasicitySpontaneityPropertiesThrombus Aging +---------+---------------+---------+-----------+----------+--------------+ CFV      Full           Yes      Yes                                 +---------+---------------+---------+-----------+----------+--------------+ SFJ      Full                                                        +---------+---------------+---------+-----------+----------+--------------+ FV Prox  Full                                                        +---------+---------------+---------+-----------+----------+--------------+  FV Mid   Full                                                        +---------+---------------+---------+-----------+----------+--------------+ FV DistalFull                                                         +---------+---------------+---------+-----------+----------+--------------+ PFV      Full                                                        +---------+---------------+---------+-----------+----------+--------------+ POP      Full           Yes      Yes                                 +---------+---------------+---------+-----------+----------+--------------+ PTV      Full                                                        +---------+---------------+---------+-----------+----------+--------------+ PERO     Full                                                        +---------+---------------+---------+-----------+----------+--------------+     Summary: RIGHT: - There is no evidence of deep vein thrombosis in the lower extremity. However, portions of this examination were limited- see technologist comments above.  - No cystic structure found in the popliteal fossa.  LEFT: - No evidence of common femoral vein obstruction.  *See table(s) above for measurements and observations. Electronically signed by Sherald Hess MD on 12/18/2020 at 4:44:43 PM.    Final     Assessment/Plan: 80 yo M with advanced dementia, recent large left subacute SDH - family has decided to proceed with palliative care, which is certainly reasonable given his baseline poor functional status - no neurosurgical f/u is advised  Bedelia Person 12/19/2020, 12:27 PM

## 2020-12-19 NOTE — Consult Note (Signed)
Consultation Note Date: 12/19/2020   Patient Name: Cameron Mcclain  DOB: 04/11/1940  MRN: [id number]  Age / Sex: 80 y.o., male  PCP: Lucky Cowboy, MD Referring Physician: Zannie Cove, MD  Reason for Consultation: Goals of care  HPI/Patient Profile: 80 y.o. male  with past medical history of dementia with frequent hospital visits for falls, HTN, HLD, admitted on 12/17/2020 with another fall resulting in a subdural hematoma. Nuerosurgery consulted and unlikely that surgery will improve patient's status significantly. Palliative consulted for GOC discussion.    Primary Decision Maker OTHER- patient's son- Cydney Ok  Discussion: Patient in bed. Pleasantly confused. He is unable to tell me his name or birthday or location. Per RN report he ate well at breakfast. He tends to get more agitated in the evenings.  Called patient's son- patient was living at [facility] prior to this admission. He required assistance for ADL's, but could feed himself- he is not able to feed himself now, per chart review is following about 50% instructions. Apparently, he spends his waking hours walking around until he falls down or falls asleep. His falls are quite frequent and recently have started to cause increasing trauma.  [name] and his mother have decided not to proceed with surgery.  If patient is discharged from hospital and falls again, or becomes ill with a life threatening illness- their preference would be to not return to hospital, and to be kept at the facility, comfortable, until end of life. [name] realizes that patient's mental status is likely to continue to decline and patient is high risk for another traumatizing fall, if he is able to get up from his bed at this point at all.  We discussed Hospice services and [name] is accepting.     SUMMARY OF RECOMMENDATIONS -D/C back to facility with  Hospice -Comfort measures only- d/c interventions that are not contributing to comfort -Lorazepam prn for agitation or anxiety -Zyprexa daily for delirium    Code Status/Advance Care Planning: DNR   Prognosis:   < 3 months due to SDH in the setting of advancing dementia, very high risk for another fall, GOC are for comfort measures only and to not return to hospital  Discharge Planning: Skilled Nursing Facility with Hospice  Primary Diagnoses: Present on Admission:  Subdural hematoma  Moderate dementia with behavioral disturbance  Hyperlipidemia, mixed  Essential hypertension  Chronic obstructive pulmonary disease (HCC)  Fall   Review of Systems  Physical Exam  Vital Signs: BP 113/72 (BP Location: Left Arm)   Pulse 64   Temp 97.9 F (36.6 C) (Oral)   Resp 14   SpO2 95%  Pain Scale: PAINAD   Pain Score: 0-No pain   SpO2: SpO2: 95 % O2 Device:SpO2: 95 % O2 Flow Rate: .   IO: Intake/output summary:  Intake/Output Summary (Last 24 hours) at 12/19/2020 1154 Last data filed at 12/19/2020 0700 Gross per 24 hour  Intake 1060 ml  Output 1200 ml  Net -140 ml    LBM: Last BM Date: 12/18/20  Baseline Weight:   Most recent weight:       Palliative Assessment/Data: 50%       Thank you for this consult. Palliative medicine will continue to follow and assist as needed.   Time In: 1100 Time Out: 1215 Time Total: 75 minutes Greater than 50%  of this time was spent counseling and coordinating care related to the above assessment and plan.  Signed by: Ocie Bob, AGNP-C Palliative Medicine    Please contact Palliative Medicine Team phone at [phone] for questions and concerns.  For individual provider: See Loretha Stapler

## 2020-12-19 NOTE — Progress Notes (Signed)
PROGRESS NOTE    Cameron Mcclain  HQP:[id number] DOB: March 31, 1940 DOA: 12/17/2020 PCP: Lucky Cowboy, MD  Brief Narrative: 80/M with history of advanced dementia, resides in a memory care unit, does not recognize family, mostly confused at baseline, history of hypertension, dyslipidemia, was brought to the emergency room after 2 falls. -In the emergency room he was noted to have large left subacute to chronic SDH with mass-effect   Assessment & Plan:   Large left subacute subdural hematoma, 3cm -In the background of advanced dementia -Neurosurgery consulted, started on p.o. Keppra -Due to advanced dementia and very poor functional status palliative care was consulted, status post family meeting today, decision made for comfort focused care, add hospice at discharge -TOC consulted  Advanced dementia -Resides in memory care unit, confused, does not recognize family, reportedly able to ambulate per spouse -Remains confused, in restraints -Delirium precautions, started on twice daily benzos today by palliative care team  Essential hypertension -BP stable, not on meds  COPD -Stable  Mild right leg swelling -Comfort care now  Multiple falls -PT eval  DVT prophylaxis: SCDs Code Status: DNR Family Communication: No family at bedside, called and updated wife yesterday Disposition Plan:  Status is: Inpatient  Remains inpatient appropriate because: Severity of illness   Consultants:  Neurosurgery  Procedures:   Antimicrobials:    Subjective: -Remains pleasantly confused, back in restraints as he tries to get out of bed  Objective: Vitals:   12/18/20 1910 12/18/20 2328 12/19/20 0344 12/19/20 0733  BP: 132/77 126/78 113/72   Pulse: 76 69 64   Resp: 15 13 14    Temp: 98.2 F (36.8 C) (!) 97.4 F (36.3 C) (!) 97.2 F (36.2 C) 97.9 F (36.6 C)  TempSrc: Oral Axillary Axillary Oral  SpO2: 98% 98% 95%     Intake/Output Summary (Last 24 hours) at 12/19/2020  1216 Last data filed at 12/19/2020 0700 Gross per 24 hour  Intake 1060 ml  Output 1200 ml  Net -140 ml   There were no vitals filed for this visit.  Examination:  General exam: Pleasant elderly male laying in bed, awake alert, confused, no distress, Posey belt and wrist restraints on CVS: S1-S2, regular rate rhythm Lungs: Clear bilaterally Abdomen: Soft, nontender, bowel sounds present Extremities: No edema  Skin: No rashes Psychiatry: Poor insight and judgment    Data Reviewed:   CBC: Recent Labs  Lab 12/17/20 2010 12/18/20 0302  WBC 7.4 6.2  NEUTROABS  --  3.5  HGB 11.5* 10.9*  HCT 33.5* 32.3*  MCV 96.5 97.9  PLT 225 208   Basic Metabolic Panel: Recent Labs  Lab 12/17/20 2010 12/18/20 0302  NA 135 134*  K 4.4 3.7  CL 101 104  CO2 26 24  GLUCOSE 93 83  BUN 26* 18  CREATININE 0.89 0.77  CALCIUM 9.0 8.4*  MG  --  1.9  PHOS  --  2.9   GFR: CrCl cannot be calculated (Unknown ideal weight.). Liver Function Tests: Recent Labs  Lab 12/17/20 2010 12/18/20 0302  AST 28 25  ALT 17 18  ALKPHOS 61 55  BILITOT 0.6 0.6  PROT 6.6 5.8*  ALBUMIN 3.8 3.0*   No results for input(s): LIPASE, AMYLASE in the last 168 hours. No results for input(s): AMMONIA in the last 168 hours. Coagulation Profile: Recent Labs  Lab 12/17/20 2010  INR 1.0   Cardiac Enzymes: No results for input(s): CKTOTAL, CKMB, CKMBINDEX, TROPONINI in the last 168 hours. BNP (last 3 results) No  results for input(s): PROBNP in the last 8760 hours. HbA1C: No results for input(s): HGBA1C in the last 72 hours. CBG: No results for input(s): GLUCAP in the last 168 hours. Lipid Profile: No results for input(s): CHOL, HDL, LDLCALC, TRIG, CHOLHDL, LDLDIRECT in the last 72 hours. Thyroid Function Tests: Recent Labs    12/18/20 0302  TSH 0.927   Anemia Panel: No results for input(s): VITAMINB12, FOLATE, FERRITIN, TIBC, IRON, RETICCTPCT in the last 72 hours. Urine analysis:    Component  Value Date/Time   COLORURINE YELLOW 12/18/2020 0056   APPEARANCEUR CLEAR 12/18/2020 0056   LABSPEC 1.018 12/18/2020 0056   PHURINE 5.0 12/18/2020 0056   GLUCOSEU NEGATIVE 12/18/2020 0056   HGBUR NEGATIVE 12/18/2020 0056   BILIRUBINUR NEGATIVE 12/18/2020 0056   KETONESUR 5 (A) 12/18/2020 0056   PROTEINUR NEGATIVE 12/18/2020 0056   UROBILINOGEN 0.2 05/03/2013 1644   NITRITE NEGATIVE 12/18/2020 0056   LEUKOCYTESUR NEGATIVE 12/18/2020 0056   Sepsis Labs: @LABRCNTIP (procalcitonin:4,lacticidven:4)  ) Recent Results (from the past 240 hour(s))  Resp Panel by RT-PCR (Flu A&B, Covid) Nasopharyngeal Swab     Status: None   Collection Time: 12/17/20  8:10 PM   Specimen: Nasopharyngeal Swab; Nasopharyngeal(NP) swabs in vial transport medium  Result Value Ref Range Status   SARS Coronavirus 2 by RT PCR NEGATIVE NEGATIVE Final    Comment: (NOTE) SARS-CoV-2 target nucleic acids are NOT DETECTED.  The SARS-CoV-2 RNA is generally detectable in upper respiratory specimens during the acute phase of infection. The lowest concentration of SARS-CoV-2 viral copies this assay can detect is 138 copies/mL. A negative result does not preclude SARS-Cov-2 infection and should not be used as the sole basis for treatment or other patient management decisions. A negative result may occur with  improper specimen collection/handling, submission of specimen other than nasopharyngeal swab, presence of viral mutation(s) within the areas targeted by this assay, and inadequate number of viral copies(<138 copies/mL). A negative result must be combined with clinical observations, patient history, and epidemiological information. The expected result is Negative.  Fact Sheet for Patients:  BloggerCourse.com  Fact Sheet for Healthcare Providers:  SeriousBroker.it  This test is no t yet approved or cleared by the [place] FDA and  has been authorized for  detection and/or diagnosis of SARS-CoV-2 by FDA under an Emergency Use Authorization (EUA). This EUA will remain  in effect (meaning this test can be used) for the duration of the COVID-19 declaration under Section 564(b)(1) of the Act, 21 U.S.C.section 360bbb-3(b)(1), unless the authorization is terminated  or revoked sooner.       Influenza A by PCR NEGATIVE NEGATIVE Final   Influenza B by PCR NEGATIVE NEGATIVE Final    Comment: (NOTE) The Xpert Xpress SARS-CoV-2/FLU/RSV plus assay is intended as an aid in the diagnosis of influenza from Nasopharyngeal swab specimens and should not be used as a sole basis for treatment. Nasal washings and aspirates are unacceptable for Xpert Xpress SARS-CoV-2/FLU/RSV testing.  Fact Sheet for Patients: BloggerCourse.com  Fact Sheet for Healthcare Providers: SeriousBroker.it  This test is not yet approved or cleared by the [place] FDA and has been authorized for detection and/or diagnosis of SARS-CoV-2 by FDA under an Emergency Use Authorization (EUA). This EUA will remain in effect (meaning this test can be used) for the duration of the COVID-19 declaration under Section 564(b)(1) of the Act, 21 U.S.C. section 360bbb-3(b)(1), unless the authorization is terminated or revoked.  Performed at [facility], [address], [place], [place] [address]  Radiology Studies: DG Elbow Complete Right  Result Date: 12/17/2020 CLINICAL DATA:  Unwitnessed fall. EXAM: RIGHT ELBOW - COMPLETE 3+ VIEW COMPARISON:  None. FINDINGS: There is no evidence of fracture, dislocation, or joint effusion. Mild degenerative changes spurring. Tiny olecranon spur soft tissues are unremarkable. IMPRESSION: No fracture or subluxation of the right elbow. Mild degenerative change. Electronically Signed   By: Narda Rutherford M.D.   On: 12/17/2020 21:06   CT HEAD WO CONTRAST  Result Date:  12/17/2020 CLINICAL DATA:  Initial evaluation for acute head trauma, fall. EXAM: CT HEAD WITHOUT CONTRAST CT CERVICAL SPINE WITHOUT CONTRAST TECHNIQUE: Multidetector CT imaging of the head and cervical spine was performed following the standard protocol without intravenous contrast. Multiplanar CT image reconstructions of the cervical spine were also generated. COMPARISON:  Prior head CT from 11/10/2020. FINDINGS: CT HEAD FINDINGS Brain: There has been interval development of a fairly large subdural hematoma on the left, largely isodense to adjacent brain and subacute in appearance, although a small amount of more hyperdense blood products is seen within this collection. Collection measures up to approximately 3 cm in diameter. Associated mass effect on the subjacent left cerebral hemisphere with up to 1 cm of left-to-right shift. Additionally, there is a smaller largely subacute appearing extra-axial collection overlying the right cerebral convexity measuring up to 7 mm in thickness (series 4, image 32), likely subdural as well. No significant mass effect. There is partial effacement of the left lateral ventricle with asymmetric dilatation of the right lateral ventricle, slightly increased from previous exam, which could reflect a degree of mild trapping. Underlying atrophy with chronic small vessel ischemic disease. No acute large vessel territory infarct. No mass lesion. Vascular: No hyperdense vessel. Calcified atherosclerosis present at skull base. Skull: Small soft tissue contusions about the forehead, left greater than right. Calvarium intact. Sinuses/Orbits: Globes and orbital soft tissues demonstrate no acute finding. Paranasal sinuses are clear. No mastoid effusion. Other: None. CT CERVICAL SPINE FINDINGS Alignment: Straightening of the normal cervical lordosis. Trace anterolisthesis of C4 on C5, C5 on C6, and C6 on C7, likely chronic and degenerative. Skull base and vertebrae: Skull base intact. Normal  C1-2 articulations are preserved. Dens is intact. Vertebral body height maintained. No acute fracture. Soft tissues and spinal canal: Paraspinous soft tissues demonstrate no acute finding. No abnormal prevertebral edema. Spinal canal demonstrates no acute finding. Vascular calcifications about the carotid bifurcations. Disc levels: Mild-to-moderate multilevel cervical spondylosis with mild spinal stenosis at C5-6. Upper chest: Visualized upper chest demonstrates no acute finding. Partially visualized lung apices are grossly clear. Other: None. IMPRESSION: CT BRAIN: 1. Interval development of a fairly large left subdural hematoma measuring up to 3 cm, largely subacute in nature. Mass effect on the subjacent left cerebral hemisphere with up to 1 cm left-to-right shift. Mild dilatation of the right lateral ventricle as compared to previous exam, suggesting mild and/or early trapping. 2. Smaller largely subacute appearing right extra-axial collection measuring up to 7 mm in thickness, also likely subdural. No associated mass effect. 3. Small soft tissue contusions about the forehead, left greater than right. No calvarial fracture. 4. Underlying atrophy with chronic small vessel ischemic disease. CT CERVICAL SPINE: 1. No acute traumatic injury within the cervical spine. 2. Mild-to-moderate multilevel cervical spondylosis with mild spinal stenosis at C5-6. Critical Value/emergent results were called by telephone at the time of interpretation on 12/17/2020 at 9:26 pm to provider [name] , who verbally acknowledged these results. Electronically Signed   By: Rise Mu  M.D.   On: 12/17/2020 21:47   CT CERVICAL SPINE WO CONTRAST  Result Date: 12/17/2020 CLINICAL DATA:  Initial evaluation for acute head trauma, fall. EXAM: CT HEAD WITHOUT CONTRAST CT CERVICAL SPINE WITHOUT CONTRAST TECHNIQUE: Multidetector CT imaging of the head and cervical spine was performed following the standard protocol without  intravenous contrast. Multiplanar CT image reconstructions of the cervical spine were also generated. COMPARISON:  Prior head CT from 11/10/2020. FINDINGS: CT HEAD FINDINGS Brain: There has been interval development of a fairly large subdural hematoma on the left, largely isodense to adjacent brain and subacute in appearance, although a small amount of more hyperdense blood products is seen within this collection. Collection measures up to approximately 3 cm in diameter. Associated mass effect on the subjacent left cerebral hemisphere with up to 1 cm of left-to-right shift. Additionally, there is a smaller largely subacute appearing extra-axial collection overlying the right cerebral convexity measuring up to 7 mm in thickness (series 4, image 32), likely subdural as well. No significant mass effect. There is partial effacement of the left lateral ventricle with asymmetric dilatation of the right lateral ventricle, slightly increased from previous exam, which could reflect a degree of mild trapping. Underlying atrophy with chronic small vessel ischemic disease. No acute large vessel territory infarct. No mass lesion. Vascular: No hyperdense vessel. Calcified atherosclerosis present at skull base. Skull: Small soft tissue contusions about the forehead, left greater than right. Calvarium intact. Sinuses/Orbits: Globes and orbital soft tissues demonstrate no acute finding. Paranasal sinuses are clear. No mastoid effusion. Other: None. CT CERVICAL SPINE FINDINGS Alignment: Straightening of the normal cervical lordosis. Trace anterolisthesis of C4 on C5, C5 on C6, and C6 on C7, likely chronic and degenerative. Skull base and vertebrae: Skull base intact. Normal C1-2 articulations are preserved. Dens is intact. Vertebral body height maintained. No acute fracture. Soft tissues and spinal canal: Paraspinous soft tissues demonstrate no acute finding. No abnormal prevertebral edema. Spinal canal demonstrates no acute finding.  Vascular calcifications about the carotid bifurcations. Disc levels: Mild-to-moderate multilevel cervical spondylosis with mild spinal stenosis at C5-6. Upper chest: Visualized upper chest demonstrates no acute finding. Partially visualized lung apices are grossly clear. Other: None. IMPRESSION: CT BRAIN: 1. Interval development of a fairly large left subdural hematoma measuring up to 3 cm, largely subacute in nature. Mass effect on the subjacent left cerebral hemisphere with up to 1 cm left-to-right shift. Mild dilatation of the right lateral ventricle as compared to previous exam, suggesting mild and/or early trapping. 2. Smaller largely subacute appearing right extra-axial collection measuring up to 7 mm in thickness, also likely subdural. No associated mass effect. 3. Small soft tissue contusions about the forehead, left greater than right. No calvarial fracture. 4. Underlying atrophy with chronic small vessel ischemic disease. CT CERVICAL SPINE: 1. No acute traumatic injury within the cervical spine. 2. Mild-to-moderate multilevel cervical spondylosis with mild spinal stenosis at C5-6. Critical Value/emergent results were called by telephone at the time of interpretation on 12/17/2020 at 9:26 pm to provider [name] , who verbally acknowledged these results. Electronically Signed   By: Rise Mu M.D.   On: 12/17/2020 21:47   DG Pelvis Portable  Result Date: 12/17/2020 CLINICAL DATA:  Fall. EXAM: PORTABLE PELVIS 1-2 VIEWS COMPARISON:  11/10/2020 FINDINGS: Diffuse bony under mineralization. The cortical margins of the bony pelvis are intact. No fracture. Pubic symphysis and sacroiliac joints are congruent. Both femoral heads are well-seated in the respective acetabula. IMPRESSION: No pelvic fracture. Electronically Signed  By: Narda Rutherford M.D.   On: 12/17/2020 21:05   DG Chest Port 1 View  Result Date: 12/17/2020 CLINICAL DATA:  Fall. EXAM: PORTABLE CHEST 1 VIEW COMPARISON:   Radiograph 11/10/2020 FINDINGS: Again seen hyperinflation with interstitial coarsening.The cardiomediastinal contours are normal. The lungs are clear. Pulmonary vasculature is normal. No consolidation, pleural effusion, or pneumothorax. No acute osseous abnormalities are seen. IMPRESSION: 1. No acute findings. 2. Chronic hyperinflation and interstitial coarsening. Electronically Signed   By: Narda Rutherford M.D.   On: 12/17/2020 21:04   VAS [place] LOWER EXTREMITY VENOUS (DVT)  Result Date: 12/18/2020  Lower Venous DVT Study Patient Name:  Cameron Mcclain  Date of Exam:   12/18/2020 Medical Rec #: [id number]            Accession #:    [id number] Date of Birth: Jun 19, 1940            Patient Gender: M Patient Age:   24 years Exam Location:  [facility] Procedure:      VAS [place] LOWER EXTREMITY VENOUS (DVT) Referring Phys: Jonny Ruiz DOUTOVA --------------------------------------------------------------------------------  Indications: Edema.  Risk Factors: None identified. Limitations: Patient positioning, patient constant movement. Comparison Study: No prior studies. Performing Technologist: Chanda Busing RVT  Examination Guidelines: A complete evaluation includes B-mode imaging, spectral Doppler, color Doppler, and power Doppler as needed of all accessible portions of each vessel. Bilateral testing is considered an integral part of a complete examination. Limited examinations for reoccurring indications may be performed as noted. The reflux portion of the exam is performed with the patient in reverse Trendelenburg.  +---------+---------------+---------+-----------+----------+--------------+ RIGHT    CompressibilityPhasicitySpontaneityPropertiesThrombus Aging +---------+---------------+---------+-----------+----------+--------------+ CFV      Full           Yes      Yes                                 +---------+---------------+---------+-----------+----------+--------------+ SFJ      Full                                                         +---------+---------------+---------+-----------+----------+--------------+ FV Prox  Full                                                        +---------+---------------+---------+-----------+----------+--------------+ FV Mid   Full                                                        +---------+---------------+---------+-----------+----------+--------------+ FV DistalFull                                                        +---------+---------------+---------+-----------+----------+--------------+ PFV      Full                                                        +---------+---------------+---------+-----------+----------+--------------+  POP      Full           Yes      Yes                                 +---------+---------------+---------+-----------+----------+--------------+ PTV      Full                                                        +---------+---------------+---------+-----------+----------+--------------+ PERO     Full                                                        +---------+---------------+---------+-----------+----------+--------------+     Summary: RIGHT: - There is no evidence of deep vein thrombosis in the lower extremity. However, portions of this examination were limited- see technologist comments above.  - No cystic structure found in the popliteal fossa.  LEFT: - No evidence of common femoral vein obstruction.  *See table(s) above for measurements and observations. Electronically signed by Sherald Hess MD on 12/18/2020 at 4:44:43 PM.    Final         Scheduled Meds:  acetaminophen  650 mg Oral TID   latanoprost  1 drop Both Eyes QHS   loratadine  10 mg Oral Daily   OLANZapine zydis  5 mg Oral Daily   Continuous Infusions:  levETIRAcetam 500 mg (12/19/20 0853)     LOS: 2 days    Time spent:    Zannie Cove, MD [facility]   12/19/2020, 12:16 PM

## 2020-12-19 NOTE — [place] FL2 (Addendum)
[place] MEDICAID FL2 LEVEL OF CARE SCREENING TOOL     IDENTIFICATION  Patient Name: Cameron Mcclain Birthdate: May 03, 1940 Sex: male Admission Date (Current Location): 12/17/2020  [facility] and [place] Number:  Producer, television/film/video and Address:  [facility], 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   Aortic atherosclerosis (HCC) by CXR in 2018 04/19/2019   Moderate dementia with behavioral disturbance 11/11/2018   Former smoker 01/12/2018   Encounter for Medicare annual wellness exam 03/19/2017   Chronic obstructive pulmonary disease (HCC) 09/10/2016   Glaucoma 10/19/2014   OSA (obstructive sleep apnea) 10/19/2014   BMI 23.0-23.9, adult 10/19/2014   Medication management 07/01/2013   Essential hypertension 12/29/2012   Hyperlipidemia, mixed 12/29/2012   Abnormal glucose 12/29/2012   Vitamin D deficiency 12/29/2012    Orientation RESPIRATION BLADDER Height & Weight      (not oriented)  Normal Incontinent, External catheter Weight:   Height:     BEHAVIORAL SYMPTOMS/MOOD NEUROLOGICAL BOWEL NUTRITION STATUS      Incontinent Diet (see discharge summary)  AMBULATORY STATUS COMMUNICATION OF NEEDS Skin   Limited Assist Does not communicate Skin abrasions                       Personal Care Assistance Level of Assistance  Total care        Total Care Assistance: Maximum assistance   Functional Limitations Info  Sight, Hearing, Speech Sight Info: Adequate Hearing Info: Impaired Speech Info: Impaired    SPECIAL CARE FACTORS FREQUENCY                       Contractures Contractures Info: Not present    Additional Factors Info  Code Status, Allergies Code Status Info: DNR Allergies Info: Benadryl (Diphenhydramine Hcl)           Current Medications (12/19/2020):  This is the current hospital active medication list Current Facility-Administered Medications  Medication Dose Route Frequency Provider Last Rate Last Admin   acetaminophen (TYLENOL) tablet 650 mg  650 mg Oral Q6H PRN Therisa Doyne, MD   650 mg at 12/18/20 1144   Or   acetaminophen (TYLENOL) suppository 650 mg  650 mg Rectal Q6H PRN Therisa Doyne, MD       acetaminophen (TYLENOL) tablet 650 mg  650 mg Oral TID Barbara Cower, NP       haloperidol lactate (HALDOL) injection 1 mg  1 mg Intravenous Q6H PRN Zannie Cove, MD   1 mg at 12/18/20 1802   latanoprost (XALATAN) 0.005 % ophthalmic solution 1 drop  1 drop Both Eyes QHS Doutova, Anastassia, MD   1 drop at 12/18/20 2153   levETIRAcetam (KEPPRA) IVPB 500 mg/100 mL premix  500 mg Intravenous Q12H Doutova, Jonny Ruiz, MD 400 mL/hr at 12/19/20 0853 500 mg at 12/19/20 0853   loratadine (  CLARITIN) tablet 10 mg  10 mg Oral Daily Therisa Doyne, MD   10 mg at 12/19/20 0853   LORazepam (ATIVAN) tablet 1 mg  1 mg Oral Q4H PRN Barbara Cower, NP       Or   LORazepam (ATIVAN) 2 MG/ML concentrated solution 1 mg  1 mg Sublingual Q4H PRN Barbara Cower, NP       Or   LORazepam (ATIVAN) injection 1 mg  1 mg Intravenous Q4H PRN Barbara Cower, NP   1 mg at 12/19/20 1225   morphine CONCENTRATE 10 MG/0.5ML oral solution 5 mg  5 mg Oral Q2H PRN Barbara Cower, NP       Or   morphine CONCENTRATE 10 MG/0.5ML oral solution 5 mg  5 mg Sublingual Q2H PRN Barbara Cower, NP       OLANZapine zydis  (ZYPREXA) disintegrating tablet 5 mg  5 mg Oral Daily Barbara Cower, NP         Discharge Medications: Please see discharge summary for a list of discharge medications.  Relevant Imaging Results:  Relevant Lab Results:   Additional Information SSN: [id number]  Lorri Frederick, LCSW

## 2020-12-19 NOTE — Progress Notes (Addendum)
Civil engineer, contracting [facility]) Hospital Liaison: RN note     Notified by Transition of Care Manger of patient/family request for [facility] services at home after discharge. Chart and patient information under review by [facility] physician. Hospice eligibility confirmed.     Writer spoke with pt's son Cydney Ok to initiate education related to hospice philosophy, services and team approach to care.  [name] verbalized understanding of information given.   Please send signed and completed DNR form home with patient/family.   Please provide prescriptions for mediations at discharge to ensure comfort until patient can be admitted onto hospice services.   DME needs have been discussed.  Patient currently has the following equipment: none.  Patient/family requests the following DME for delivery to [facility]: hospital bed, fall mat.  [facility] equipment manager has been notified and will contact DME provider to arrange delivery to the home.   [facility] aware of the above. Please notify [facility] when patient is ready to leave the unit at discharge. (Call [phone] or [phone] after 5pm.)  [facility] information and contact numbers given to [facility].       Please do not hesitate to call with questions.   Thank you for the opportunity to participate in this patient's care.  Gillian Scarce, BSN, RN       [name] Liaison (listed on AMION under Hospice /[phone]  [phone] (24h on call)

## 2020-12-19 NOTE — Progress Notes (Signed)
Pt resting comfortably. Call bell in reach and bed in lowest position with bed alarm on.

## 2020-12-20 DIAGNOSIS — S065XAA Traumatic subdural hemorrhage with loss of consciousness status unknown, initial encounter: Secondary | ICD-10-CM | POA: Diagnosis not present

## 2020-12-20 DIAGNOSIS — M7989 Other specified soft tissue disorders: Secondary | ICD-10-CM | POA: Diagnosis not present

## 2020-12-20 DIAGNOSIS — Z23 Encounter for immunization: Secondary | ICD-10-CM | POA: Diagnosis not present

## 2020-12-20 SURGERY — CREATION, CRANIAL BURR HOLE
Anesthesia: General | Laterality: Left

## 2020-12-20 MED ORDER — OLANZAPINE 5 MG PO TBDP: 5.0000 mg | ORAL_TABLET | Freq: Every day | ORAL | Status: AC

## 2020-12-20 MED ORDER — LORAZEPAM 1 MG PO TABS: 1.0000 mg | ORAL_TABLET | Freq: Four times a day (QID) | ORAL | 0 refills | Status: AC | PRN

## 2020-12-20 MED ORDER — ACETAMINOPHEN 325 MG PO TABS: 650.0000 mg | ORAL_TABLET | Freq: Four times a day (QID) | ORAL | Status: AC | PRN

## 2020-12-20 MED ORDER — MORPHINE SULFATE (CONCENTRATE) 10 MG/0.5ML PO SOLN: 5.0000 mg | ORAL | 0 refills | Status: AC | PRN

## 2020-12-20 NOTE — TOC Transition Note (Signed)
Transition of Care [facility]) - CM/SW Discharge Note   Patient Details  Name: Maximillion Gill MRN: [id number] Date of Birth: 1940/09/12  Transition of Care [facility]) CM/SW Contact:  Eduard Roux, LCSW Phone Number: 12/20/2020, 2:06 PM   Clinical Narrative:     Patient will Discharge to: [facility] ALF Discharge Date: 12/20/2020 Family Notified: Conrad,son Transport PY:KDXI  Per MD patient is ready for discharge. RN, patient, and facility notified of discharge. Discharge Summary sent to facility. RN given number for [phone]. Ambulance transport requested for patient.   Clinical Social Worker signing off.  Antony Blackbird, MSW, LCSW Clinical Social Worker     Final next level of care: Assisted Living Barriers to Discharge: Barriers Resolved   Patient Goals and CMS Choice        Discharge Placement              Patient chooses bed at:  [facility]F) Patient to be transferred to facility by: PTAR Name of family member notified: [name], son Patient and family notified of of transfer: 12/20/20  Discharge Plan and Services In-house Referral: Clinical Social Work   Post Acute Care Choice: Hospice                               Social Determinants of Health (SDOH) Interventions     Readmission Risk Interventions No flowsheet data found.

## 2020-12-20 NOTE — Progress Notes (Signed)
[facility] 4NP06   [facility]) Hospital Liaison Note  Hospice eligibility confirmed. [facility] liaison will continue to follow for discharge. Please call with any questions or concerns.   Thank you  Ardis Rowan [facility] Liaison  [phone]

## 2020-12-20 NOTE — Progress Notes (Signed)
Pt with discharge orders, discharge paperwork placed in chart for [facility] to include prescription. IV's removed. Pt picked up and transported via [facility].

## 2020-12-20 NOTE — Progress Notes (Signed)
Report called to [place] at [facility].

## 2020-12-20 NOTE — Discharge Summary (Signed)
Physician Discharge Summary  Fabrizzio Marcella BMW:[id number] DOB: 1940/03/30 DOA: 12/17/2020  PCP: Lucky Cowboy, MD  Admit date: 12/17/2020 Discharge date: 12/20/2020  Time spent:  Recommendations for Outpatient Follow-up:  Discharge back to ALF with hospice, comfort focused care   Discharge Diagnoses:   Advanced Alzheimer's dementia Large subdural hematoma Delirium   Essential hypertension   Hyperlipidemia, mixed   Chronic obstructive pulmonary disease (HCC)   SDH (subdural hematoma)   Fall   Discharge Condition: Stable  Diet recommendation: Comfort foods  There were no vitals filed for this visit.  History of present illness:  80/M with history of advanced dementia, resides in a memory care unit, does not recognize family, mostly confused at baseline, history of hypertension, dyslipidemia, was brought to the emergency room after 2 falls. -In the emergency room he was noted to have large left subacute to chronic SDH with [facility] Course:   Large left subacute subdural hematoma, 3cm -In the background of advanced dementia -Neurosurgery consulted, started on p.o. Keppra -Due to advanced dementia and very poor functional status palliative care was consulted, status post family meeting yesterday, decision made for comfort focused care, hospice set up at discharge -He will be discharged back with hospice services   Advanced dementia -Resides in memory care unit, confused, does not recognize family, reportedly able to ambulate per spouse -Remains confused, required restraints this admission   Essential hypertension -BP stable, not on meds   COPD -Stable   Mild right leg swelling -Comfort care now   Multiple falls   Consultations:   Discharge Exam: Vitals:   12/19/20 1700 12/19/20 2014  BP: 121/75 135/84  Pulse:  86  Resp:  19  Temp: 98 F (36.7 C) 98.3 F (36.8 C)  SpO2:  96%   General exam: Pleasant elderly male  laying in bed, awake alert, confused, no distress, CVS: S1-S2, regular rate rhythm Lungs: Clear bilaterally Abdomen: Soft, nontender, bowel sounds present Extremities: No edema  Skin: No rashes Psychiatry: Poor insight and judgment  Discharge Instructions   Discharge Instructions     Diet - low sodium heart healthy   Complete by: As directed    Increase activity slowly   Complete by: As directed       Allergies as of 12/20/2020       Reactions   Benadryl [diphenhydramine Hcl] Other (See Comments)   Hallucinations, itching, shaking        Medication List     STOP taking these medications    calcium carbonate 600 MG tablet Commonly known as: OS-CAL   ezetimibe 10 MG tablet Commonly known as: ZETIA   Fish Oil 1000 MG Caps   gemfibrozil 600 MG tablet Commonly known as: LOPID   ketoconazole 2 % cream Commonly known as: NIZORAL   latanoprost 0.005 % ophthalmic solution Commonly known as: XALATAN   loratadine 10 MG tablet Commonly known as: CLARITIN   Magnesium 250 MG Tabs   rosuvastatin 10 MG tablet Commonly known as: Crestor   Vitamin D 50 MCG (2000 UT) Caps       TAKE these medications    acetaminophen 325 MG tablet Commonly known as: TYLENOL Take 2 tablets (650 mg total) by mouth every 6 (six) hours as needed for mild pain (or Fever >/= 101).   LORazepam 1 MG tablet Commonly known as: ATIVAN Take 1 tablet (1 mg total) by mouth every 6 (six) hours as needed for anxiety (and agitation). What changed:  medication  strength how much to take when to take this   mirtazapine 30 MG tablet Commonly known as: Remeron Take 1/2 to 1 tablet 1 hour before Bedtime for Appetite & Sleep What changed:  how much to take how to take this when to take this additional instructions   morphine CONCENTRATE 10 MG/0.5ML Soln concentrated solution Take 0.25 mLs (5 mg total) by mouth every 4 (four) hours as needed for moderate pain (or dyspnea).   OLANZapine  zydis 5 MG disintegrating tablet Commonly known as: ZYPREXA Take 1 tablet (5 mg total) by mouth daily.       Allergies  Allergen Reactions   Benadryl [Diphenhydramine Hcl] Other (See Comments)    Hallucinations, itching, shaking      The results of significant diagnostics from this hospitalization (including imaging, microbiology, ancillary and laboratory) are listed below for reference.    Significant Diagnostic Studies: DG Elbow Complete Right  Result Date: 12/17/2020 CLINICAL DATA:  Unwitnessed fall. EXAM: RIGHT ELBOW - COMPLETE 3+ VIEW COMPARISON:  None. FINDINGS: There is no evidence of fracture, dislocation, or joint effusion. Mild degenerative changes spurring. Tiny olecranon spur soft tissues are unremarkable. IMPRESSION: No fracture or subluxation of the right elbow. Mild degenerative change. Electronically Signed   By: Narda Rutherford M.D.   On: 12/17/2020 21:06   CT HEAD WO CONTRAST  Result Date: 12/17/2020 CLINICAL DATA:  Initial evaluation for acute head trauma, fall. EXAM: CT HEAD WITHOUT CONTRAST CT CERVICAL SPINE WITHOUT CONTRAST TECHNIQUE: Multidetector CT imaging of the head and cervical spine was performed following the standard protocol without intravenous contrast. Multiplanar CT image reconstructions of the cervical spine were also generated. COMPARISON:  Prior head CT from 11/10/2020. FINDINGS: CT HEAD FINDINGS Brain: There has been interval development of a fairly large subdural hematoma on the left, largely isodense to adjacent brain and subacute in appearance, although a small amount of more hyperdense blood products is seen within this collection. Collection measures up to approximately 3 cm in diameter. Associated mass effect on the subjacent left cerebral hemisphere with up to 1 cm of left-to-right shift. Additionally, there is a smaller largely subacute appearing extra-axial collection overlying the right cerebral convexity measuring up to 7 mm in thickness  (series 4, image 32), likely subdural as well. No significant mass effect. There is partial effacement of the left lateral ventricle with asymmetric dilatation of the right lateral ventricle, slightly increased from previous exam, which could reflect a degree of mild trapping. Underlying atrophy with chronic small vessel ischemic disease. No acute large vessel territory infarct. No mass lesion. Vascular: No hyperdense vessel. Calcified atherosclerosis present at skull base. Skull: Small soft tissue contusions about the forehead, left greater than right. Calvarium intact. Sinuses/Orbits: Globes and orbital soft tissues demonstrate no acute finding. Paranasal sinuses are clear. No mastoid effusion. Other: None. CT CERVICAL SPINE FINDINGS Alignment: Straightening of the normal cervical lordosis. Trace anterolisthesis of C4 on C5, C5 on C6, and C6 on C7, likely chronic and degenerative. Skull base and vertebrae: Skull base intact. Normal C1-2 articulations are preserved. Dens is intact. Vertebral body height maintained. No acute fracture. Soft tissues and spinal canal: Paraspinous soft tissues demonstrate no acute finding. No abnormal prevertebral edema. Spinal canal demonstrates no acute finding. Vascular calcifications about the carotid bifurcations. Disc levels: Mild-to-moderate multilevel cervical spondylosis with mild spinal stenosis at C5-6. Upper chest: Visualized upper chest demonstrates no acute finding. Partially visualized lung apices are grossly clear. Other: None. IMPRESSION: CT BRAIN: 1. Interval development of a  fairly large left subdural hematoma measuring up to 3 cm, largely subacute in nature. Mass effect on the subjacent left cerebral hemisphere with up to 1 cm left-to-right shift. Mild dilatation of the right lateral ventricle as compared to previous exam, suggesting mild and/or early trapping. 2. Smaller largely subacute appearing right extra-axial collection measuring up to 7 mm in thickness, also  likely subdural. No associated mass effect. 3. Small soft tissue contusions about the forehead, left greater than right. No calvarial fracture. 4. Underlying atrophy with chronic small vessel ischemic disease. CT CERVICAL SPINE: 1. No acute traumatic injury within the cervical spine. 2. Mild-to-moderate multilevel cervical spondylosis with mild spinal stenosis at C5-6. Critical Value/emergent results were called by telephone at the time of interpretation on 12/17/2020 at 9:26 pm to provider [name] , who verbally acknowledged these results. Electronically Signed   By: Rise Mu M.D.   On: 12/17/2020 21:47   CT CERVICAL SPINE WO CONTRAST  Result Date: 12/17/2020 CLINICAL DATA:  Initial evaluation for acute head trauma, fall. EXAM: CT HEAD WITHOUT CONTRAST CT CERVICAL SPINE WITHOUT CONTRAST TECHNIQUE: Multidetector CT imaging of the head and cervical spine was performed following the standard protocol without intravenous contrast. Multiplanar CT image reconstructions of the cervical spine were also generated. COMPARISON:  Prior head CT from 11/10/2020. FINDINGS: CT HEAD FINDINGS Brain: There has been interval development of a fairly large subdural hematoma on the left, largely isodense to adjacent brain and subacute in appearance, although a small amount of more hyperdense blood products is seen within this collection. Collection measures up to approximately 3 cm in diameter. Associated mass effect on the subjacent left cerebral hemisphere with up to 1 cm of left-to-right shift. Additionally, there is a smaller largely subacute appearing extra-axial collection overlying the right cerebral convexity measuring up to 7 mm in thickness (series 4, image 32), likely subdural as well. No significant mass effect. There is partial effacement of the left lateral ventricle with asymmetric dilatation of the right lateral ventricle, slightly increased from previous exam, which could reflect a degree of mild  trapping. Underlying atrophy with chronic small vessel ischemic disease. No acute large vessel territory infarct. No mass lesion. Vascular: No hyperdense vessel. Calcified atherosclerosis present at skull base. Skull: Small soft tissue contusions about the forehead, left greater than right. Calvarium intact. Sinuses/Orbits: Globes and orbital soft tissues demonstrate no acute finding. Paranasal sinuses are clear. No mastoid effusion. Other: None. CT CERVICAL SPINE FINDINGS Alignment: Straightening of the normal cervical lordosis. Trace anterolisthesis of C4 on C5, C5 on C6, and C6 on C7, likely chronic and degenerative. Skull base and vertebrae: Skull base intact. Normal C1-2 articulations are preserved. Dens is intact. Vertebral body height maintained. No acute fracture. Soft tissues and spinal canal: Paraspinous soft tissues demonstrate no acute finding. No abnormal prevertebral edema. Spinal canal demonstrates no acute finding. Vascular calcifications about the carotid bifurcations. Disc levels: Mild-to-moderate multilevel cervical spondylosis with mild spinal stenosis at C5-6. Upper chest: Visualized upper chest demonstrates no acute finding. Partially visualized lung apices are grossly clear. Other: None. IMPRESSION: CT BRAIN: 1. Interval development of a fairly large left subdural hematoma measuring up to 3 cm, largely subacute in nature. Mass effect on the subjacent left cerebral hemisphere with up to 1 cm left-to-right shift. Mild dilatation of the right lateral ventricle as compared to previous exam, suggesting mild and/or early trapping. 2. Smaller largely subacute appearing right extra-axial collection measuring up to 7 mm in thickness, also likely subdural. No associated  mass effect. 3. Small soft tissue contusions about the forehead, left greater than right. No calvarial fracture. 4. Underlying atrophy with chronic small vessel ischemic disease. CT CERVICAL SPINE: 1. No acute traumatic injury within the  cervical spine. 2. Mild-to-moderate multilevel cervical spondylosis with mild spinal stenosis at C5-6. Critical Value/emergent results were called by telephone at the time of interpretation on 12/17/2020 at 9:26 pm to provider [name] , who verbally acknowledged these results. Electronically Signed   By: Rise Mu M.D.   On: 12/17/2020 21:47   DG Pelvis Portable  Result Date: 12/17/2020 CLINICAL DATA:  Fall. EXAM: PORTABLE PELVIS 1-2 VIEWS COMPARISON:  11/10/2020 FINDINGS: Diffuse bony under mineralization. The cortical margins of the bony pelvis are intact. No fracture. Pubic symphysis and sacroiliac joints are congruent. Both femoral heads are well-seated in the respective acetabula. IMPRESSION: No pelvic fracture. Electronically Signed   By: Narda Rutherford M.D.   On: 12/17/2020 21:05   DG Chest Port 1 View  Result Date: 12/17/2020 CLINICAL DATA:  Fall. EXAM: PORTABLE CHEST 1 VIEW COMPARISON:  Radiograph 11/10/2020 FINDINGS: Again seen hyperinflation with interstitial coarsening.The cardiomediastinal contours are normal. The lungs are clear. Pulmonary vasculature is normal. No consolidation, pleural effusion, or pneumothorax. No acute osseous abnormalities are seen. IMPRESSION: 1. No acute findings. 2. Chronic hyperinflation and interstitial coarsening. Electronically Signed   By: Narda Rutherford M.D.   On: 12/17/2020 21:04   VAS [place] LOWER EXTREMITY VENOUS (DVT)  Result Date: 12/18/2020  Lower Venous DVT Study Patient Name:  CAZ WEAVER  Date of Exam:   12/18/2020 Medical Rec #: [id number]            Accession #:    [id number] Date of Birth: Jun 14, 1940            Patient Gender: M Patient Age:   80 years Exam Location:  [facility] Procedure:      VAS [place] LOWER EXTREMITY VENOUS (DVT) Referring Phys: Jonny Ruiz DOUTOVA --------------------------------------------------------------------------------  Indications: Edema.  Risk Factors: None identified. Limitations:  Patient positioning, patient constant movement. Comparison Study: No prior studies. Performing Technologist: Chanda Busing RVT  Examination Guidelines: A complete evaluation includes B-mode imaging, spectral Doppler, color Doppler, and power Doppler as needed of all accessible portions of each vessel. Bilateral testing is considered an integral part of a complete examination. Limited examinations for reoccurring indications may be performed as noted. The reflux portion of the exam is performed with the patient in reverse Trendelenburg.  +---------+---------------+---------+-----------+----------+--------------+ RIGHT    CompressibilityPhasicitySpontaneityPropertiesThrombus Aging +---------+---------------+---------+-----------+----------+--------------+ CFV      Full           Yes      Yes                                 +---------+---------------+---------+-----------+----------+--------------+ SFJ      Full                                                        +---------+---------------+---------+-----------+----------+--------------+ FV Prox  Full                                                        +---------+---------------+---------+-----------+----------+--------------+  FV Mid   Full                                                        +---------+---------------+---------+-----------+----------+--------------+ FV DistalFull                                                        +---------+---------------+---------+-----------+----------+--------------+ PFV      Full                                                        +---------+---------------+---------+-----------+----------+--------------+ POP      Full           Yes      Yes                                 +---------+---------------+---------+-----------+----------+--------------+ PTV      Full                                                         +---------+---------------+---------+-----------+----------+--------------+ PERO     Full                                                        +---------+---------------+---------+-----------+----------+--------------+     Summary: RIGHT: - There is no evidence of deep vein thrombosis in the lower extremity. However, portions of this examination were limited- see technologist comments above.  - No cystic structure found in the popliteal fossa.  LEFT: - No evidence of common femoral vein obstruction.  *See table(s) above for measurements and observations. Electronically signed by Sherald Hess MD on 12/18/2020 at 4:44:43 PM.    Final     Microbiology: Recent Results (from the past 240 hour(s))  Resp Panel by RT-PCR (Flu A&B, Covid) Nasopharyngeal Swab     Status: None   Collection Time: 12/17/20  8:10 PM   Specimen: Nasopharyngeal Swab; Nasopharyngeal(NP) swabs in vial transport medium  Result Value Ref Range Status   SARS Coronavirus 2 by RT PCR NEGATIVE NEGATIVE Final    Comment: (NOTE) SARS-CoV-2 target nucleic acids are NOT DETECTED.  The SARS-CoV-2 RNA is generally detectable in upper respiratory specimens during the acute phase of infection. The lowest concentration of SARS-CoV-2 viral copies this assay can detect is 138 copies/mL. A negative result does not preclude SARS-Cov-2 infection and should not be used as the sole basis for treatment or other patient management decisions. A negative result may occur with  improper specimen collection/handling, submission of specimen other than nasopharyngeal swab, presence of viral mutation(s) within the areas targeted by this assay, and inadequate number  of viral copies(<138 copies/mL). A negative result must be combined with clinical observations, patient history, and epidemiological information. The expected result is Negative.  Fact Sheet for Patients:  BloggerCourse.com  Fact Sheet for Healthcare  Providers:  SeriousBroker.it  This test is no t yet approved or cleared by the [place] FDA and  has been authorized for detection and/or diagnosis of SARS-CoV-2 by FDA under an Emergency Use Authorization (EUA). This EUA will remain  in effect (meaning this test can be used) for the duration of the COVID-19 declaration under Section 564(b)(1) of the Act, 21 U.S.C.section 360bbb-3(b)(1), unless the authorization is terminated  or revoked sooner.       Influenza A by PCR NEGATIVE NEGATIVE Final   Influenza B by PCR NEGATIVE NEGATIVE Final    Comment: (NOTE) The Xpert Xpress SARS-CoV-2/FLU/RSV plus assay is intended as an aid in the diagnosis of influenza from Nasopharyngeal swab specimens and should not be used as a sole basis for treatment. Nasal washings and aspirates are unacceptable for Xpert Xpress SARS-CoV-2/FLU/RSV testing.  Fact Sheet for Patients: BloggerCourse.com  Fact Sheet for Healthcare Providers: SeriousBroker.it  This test is not yet approved or cleared by the [place] FDA and has been authorized for detection and/or diagnosis of SARS-CoV-2 by FDA under an Emergency Use Authorization (EUA). This EUA will remain in effect (meaning this test can be used) for the duration of the COVID-19 declaration under Section 564(b)(1) of the Act, 21 U.S.C. section 360bbb-3(b)(1), unless the authorization is terminated or revoked.  Performed at [facility], [address], [place], [place] [address]      Labs: Basic Metabolic Panel: Recent Labs  Lab 12/17/20 2010 12/18/20 0302  NA 135 134*  K 4.4 3.7  CL 101 104  CO2 26 24  GLUCOSE 93 83  BUN 26* 18  CREATININE 0.89 0.77  CALCIUM 9.0 8.4*  MG  --  1.9  PHOS  --  2.9   Liver Function Tests: Recent Labs  Lab 12/17/20 2010 12/18/20 0302  AST 28 25  ALT 17 18  ALKPHOS 61 55  BILITOT 0.6 0.6  PROT 6.6  5.8*  ALBUMIN 3.8 3.0*   No results for input(s): LIPASE, AMYLASE in the last 168 hours. No results for input(s): AMMONIA in the last 168 hours. CBC: Recent Labs  Lab 12/17/20 2010 12/18/20 0302  WBC 7.4 6.2  NEUTROABS  --  3.5  HGB 11.5* 10.9*  HCT 33.5* 32.3*  MCV 96.5 97.9  PLT 225 208   Cardiac Enzymes: No results for input(s): CKTOTAL, CKMB, CKMBINDEX, TROPONINI in the last 168 hours. BNP: BNP (last 3 results) No results for input(s): BNP in the last 8760 hours.  ProBNP (last 3 results) No results for input(s): PROBNP in the last 8760 hours.  CBG: No results for input(s): GLUCAP in the last 168 hours.     Signed:  Zannie Cove MD.  [facility] 12/20/2020, 9:29 AM

## 2020-12-20 NOTE — Progress Notes (Signed)
Pt in bed resting comfortably, vital signs stable, responds when spoken to, denies any pain, pt turned Q2. Cardiac and respiratory system WNL. IV patent, bed alarm on and call bell within reach. Pt awaiting PTAR.

## 2020-12-26 DIAGNOSIS — S065X0S Traumatic subdural hemorrhage without loss of consciousness, sequela: Secondary | ICD-10-CM | POA: Diagnosis not present

## 2020-12-26 DIAGNOSIS — E559 Vitamin D deficiency, unspecified: Secondary | ICD-10-CM | POA: Diagnosis not present

## 2020-12-26 DIAGNOSIS — R296 Repeated falls: Secondary | ICD-10-CM | POA: Diagnosis not present

## 2020-12-26 DIAGNOSIS — M6281 Muscle weakness (generalized): Secondary | ICD-10-CM | POA: Diagnosis not present

## 2020-12-26 DIAGNOSIS — I10 Essential (primary) hypertension: Secondary | ICD-10-CM | POA: Diagnosis not present

## 2020-12-26 DIAGNOSIS — H409 Unspecified glaucoma: Secondary | ICD-10-CM | POA: Diagnosis not present

## 2020-12-26 DIAGNOSIS — E782 Mixed hyperlipidemia: Secondary | ICD-10-CM | POA: Diagnosis not present

## 2020-12-26 DIAGNOSIS — F0150 Vascular dementia without behavioral disturbance: Secondary | ICD-10-CM | POA: Diagnosis not present

## 2021-01-01 ENCOUNTER — Encounter: Payer: PPO | Admitting: Internal Medicine

## 2021-01-10 DIAGNOSIS — M6281 Muscle weakness (generalized): Secondary | ICD-10-CM | POA: Diagnosis not present

## 2021-01-10 DIAGNOSIS — R627 Adult failure to thrive: Secondary | ICD-10-CM | POA: Diagnosis not present

## 2021-01-10 DIAGNOSIS — S065X0S Traumatic subdural hemorrhage without loss of consciousness, sequela: Secondary | ICD-10-CM | POA: Diagnosis not present

## 2021-01-10 DIAGNOSIS — F01511 Vascular dementia, unspecified severity, with agitation: Secondary | ICD-10-CM | POA: Diagnosis not present

## 2021-01-10 DIAGNOSIS — E782 Mixed hyperlipidemia: Secondary | ICD-10-CM | POA: Diagnosis not present

## 2021-01-10 DIAGNOSIS — I10 Essential (primary) hypertension: Secondary | ICD-10-CM | POA: Diagnosis not present

## 2021-01-10 DIAGNOSIS — E559 Vitamin D deficiency, unspecified: Secondary | ICD-10-CM | POA: Diagnosis not present

## 2021-01-10 DIAGNOSIS — H409 Unspecified glaucoma: Secondary | ICD-10-CM | POA: Diagnosis not present

## 2021-02-04 DEATH — deceased

## 2021-02-26 ENCOUNTER — Ambulatory Visit: Payer: PPO | Admitting: Adult Health Nurse Practitioner

## 2021-03-15 ENCOUNTER — Ambulatory Visit: Payer: PPO | Admitting: Nurse Practitioner

## 2023-04-25 IMAGING — CT CT CERVICAL SPINE W/O CM
3 series · 12 of 33 positions shown, 14 images · non-contrast
Comparison: Prior head CT from 11/10/2020.

CLINICAL DATA: Initial evaluation for acute head trauma, fall.

EXAM:
CT HEAD WITHOUT CONTRAST
CT CERVICAL SPINE WITHOUT CONTRAST
TECHNIQUE: Multidetector CT imaging of the head and cervical spine was
performed following the standard protocol without intravenous
contrast. Multiplanar CT image reconstructions of the cervical spine
were also generated.

[Series 4: c spine soft · axial · 0.37mm/px · z∈[-482,-384]mm · 4 of 71 slices shown, 5 images]
[im 11/71  soft-tissue]
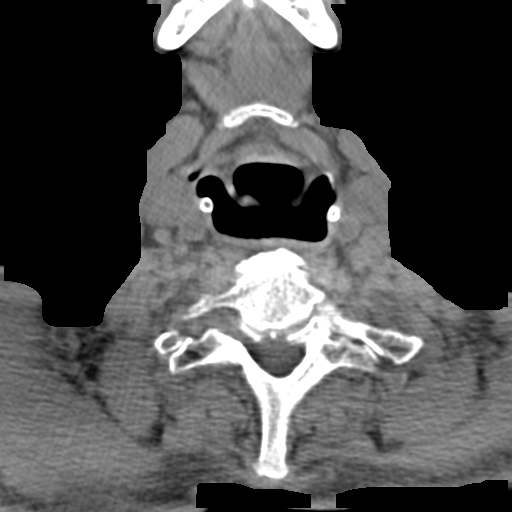
[im 11/71  bone]
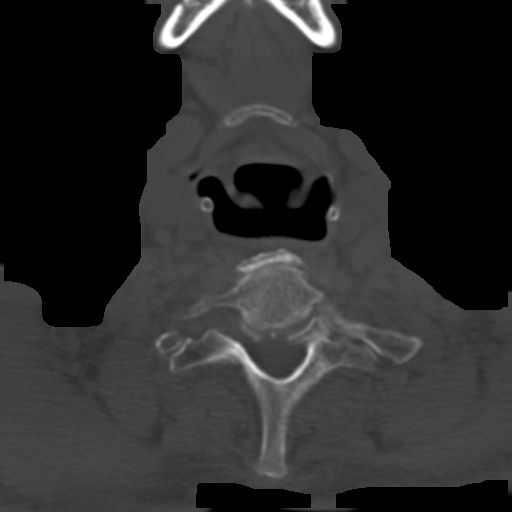
[im 27/71  bone]
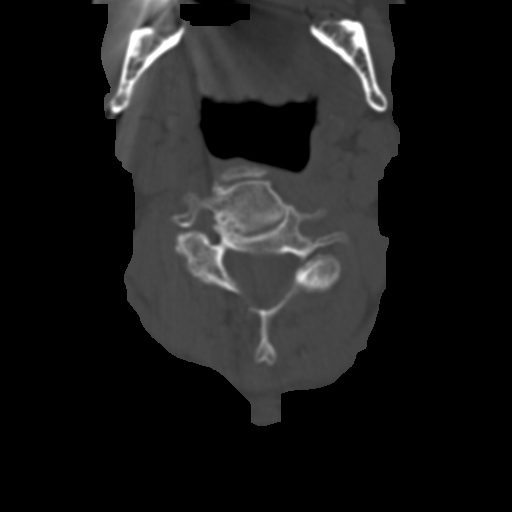
[im 44/71  bone]
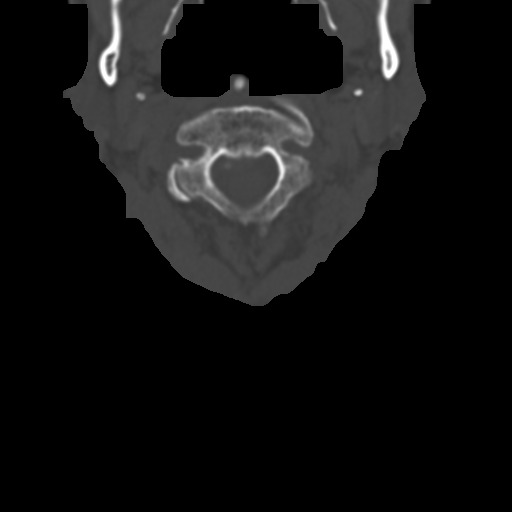
[im 60/71  bone]
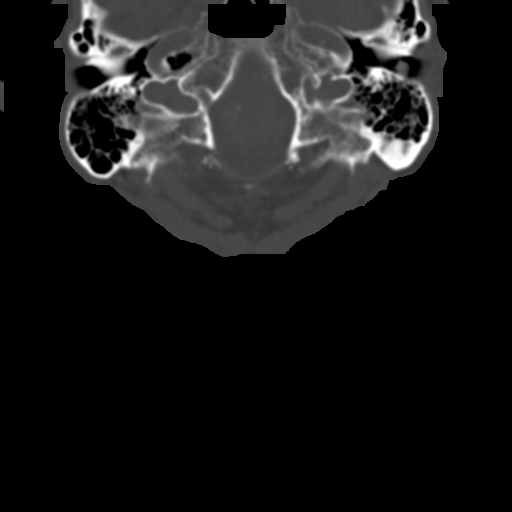

[Series 5: cor bone · coronal · 0.36mm/px · 3 of 58 slices shown]
[im 16/58  bone]
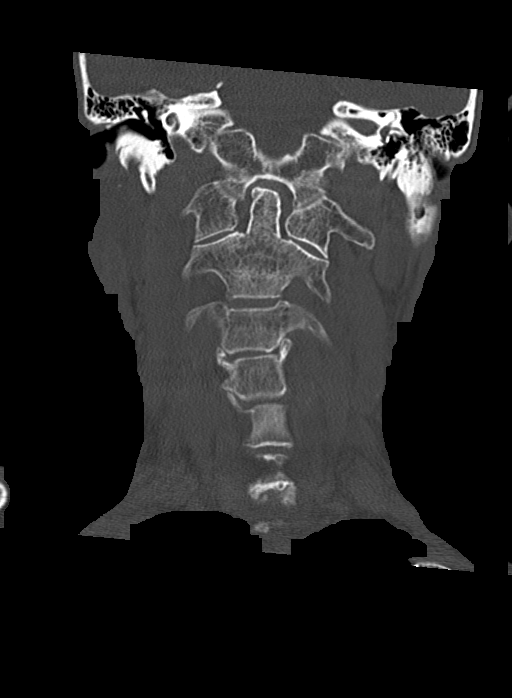
[im 25/58  bone]
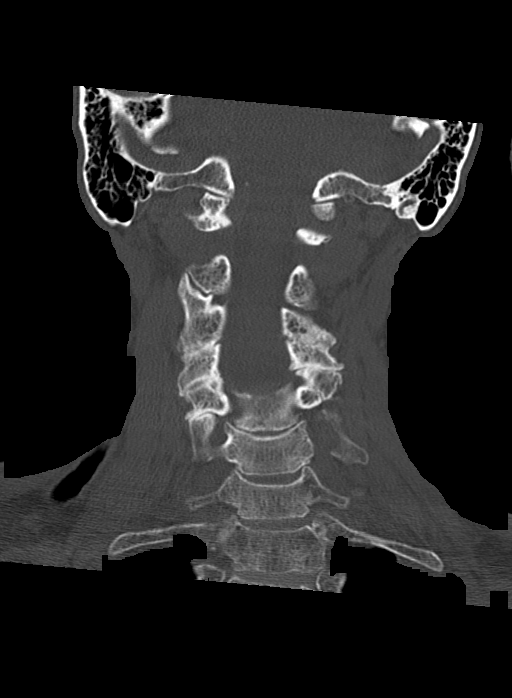
[im 33/58  bone]
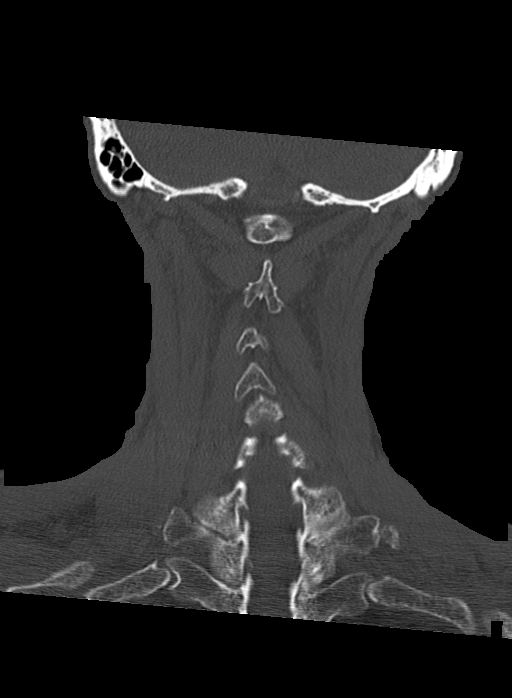

[Series 6: sag bone · sagittal · 0.30mm/px · 5 of 62 slices shown, 6 images]
[im 21/62  bone]
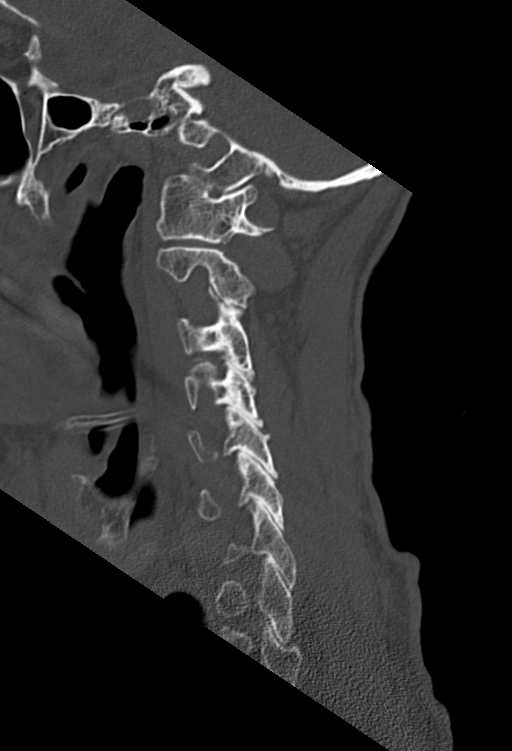
[im 26/62  bone]
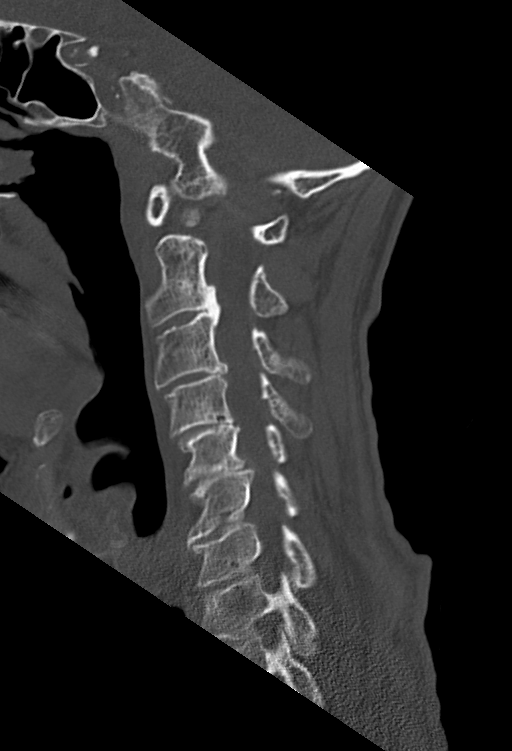
[im 31/62  soft-tissue]
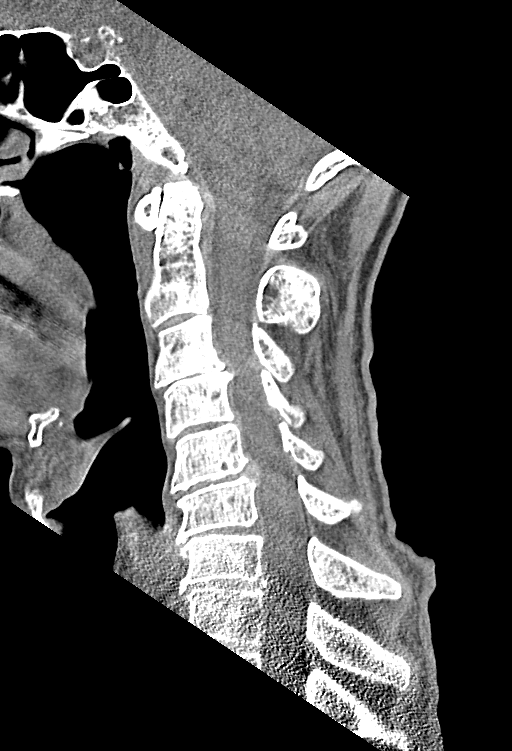
[im 31/62  bone]
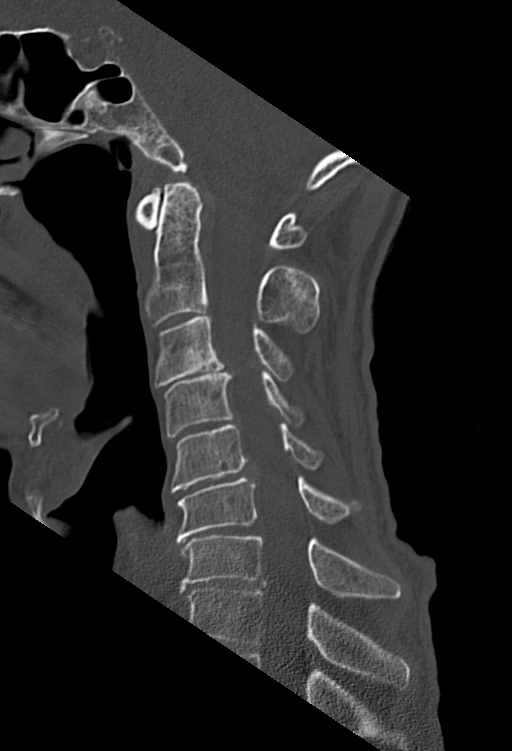
[im 36/62  bone]
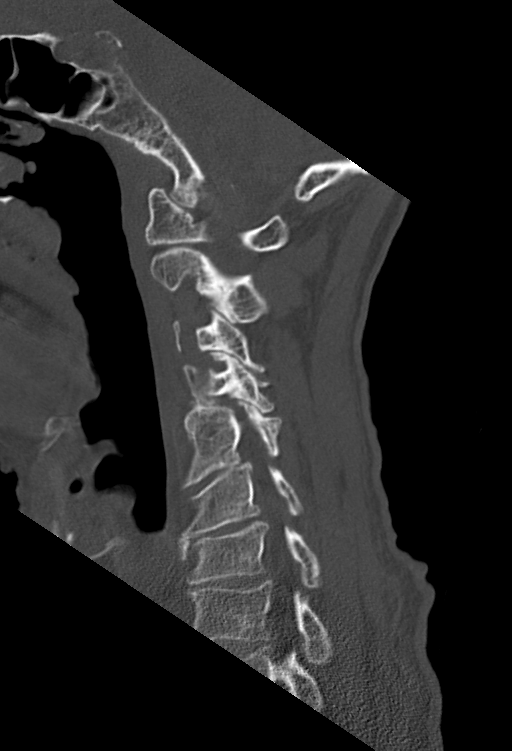
[im 41/62  bone]
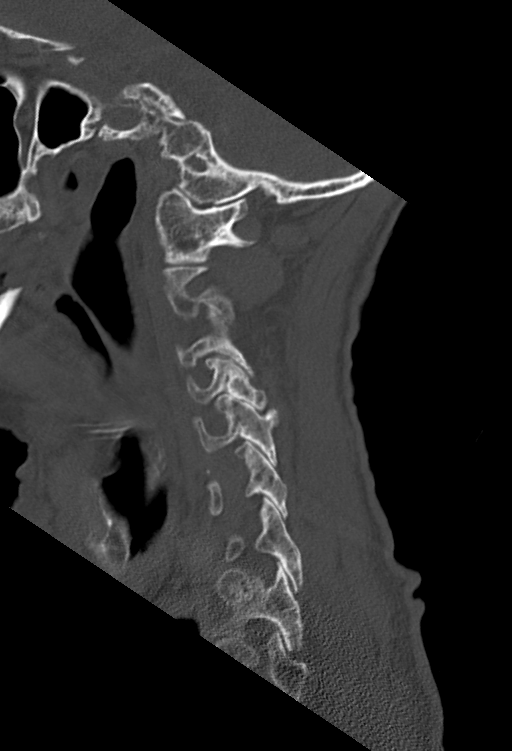

[12 of 33 positions shown; findings below may reference images not displayed]

FINDINGS: CT HEAD FINDINGS

Brain: There has been interval development of a fairly large
subdural hematoma on the left, largely isodense to adjacent brain
and subacute in appearance, although a small amount of more
hyperdense blood products is seen within this collection. Collection
measures up to approximately 3 cm in diameter. Associated mass
effect on the subjacent left cerebral hemisphere with up to 1 cm of
left-to-right shift. Additionally, there is a smaller largely
subacute appearing extra-axial collection overlying the right
cerebral convexity measuring up to 7 mm in thickness (series 4,
image 32), likely subdural as well. No significant mass effect.
There is partial effacement of the left lateral ventricle with
asymmetric dilatation of the right lateral ventricle, slightly
increased from previous exam, which could reflect a degree of mild
trapping.

Underlying atrophy with chronic small vessel ischemic disease. No
acute large vessel territory infarct. No mass lesion.

Vascular: No hyperdense vessel. Calcified atherosclerosis present at
skull base.

Skull: Small soft tissue contusions about the forehead, left greater
than right. Calvarium intact.

Sinuses/Orbits: Globes and orbital soft tissues demonstrate no acute
finding. Paranasal sinuses are clear. No mastoid effusion.

Other: None.

CT CERVICAL SPINE FINDINGS

Alignment: Straightening of the normal cervical lordosis. Trace
anterolisthesis of C4 on C5, C5 on C6, and C6 on C7, likely chronic
and degenerative.

Skull base and vertebrae: Skull base intact. Normal C1-2
articulations are preserved. Dens is intact. Vertebral body height
maintained. No acute fracture.

Soft tissues and spinal canal: Paraspinous soft tissues demonstrate
no acute finding. No abnormal prevertebral edema. Spinal canal
demonstrates no acute finding. Vascular calcifications about the
carotid bifurcations.

Disc levels: Mild-to-moderate multilevel cervical spondylosis with
mild spinal stenosis at C5-6.

Upper chest: Visualized upper chest demonstrates no acute finding.
Partially visualized lung apices are grossly clear.

Other: None.
IMPRESSION: CT BRAIN:

1. Interval development of a fairly large left subdural hematoma
measuring up to 3 cm, largely subacute in nature. Mass effect on the
subjacent left cerebral hemisphere with up to 1 cm left-to-right
shift. Mild dilatation of the right lateral ventricle as compared to
previous exam, suggesting mild and/or early trapping.
2. Smaller largely subacute appearing right extra-axial collection
measuring up to 7 mm in thickness, also likely subdural. No
associated mass effect.
3. Small soft tissue contusions about the forehead, left greater
than right. No calvarial fracture.
4. Underlying atrophy with chronic small vessel ischemic disease.

CT CERVICAL SPINE:

1. No acute traumatic injury within the cervical spine.
2. Mild-to-moderate multilevel cervical spondylosis with mild spinal
stenosis at C5-6.

Critical Value/emergent results were called by telephone at the time
of interpretation on 12/17/2020 at [DATE] to provider TAHIRAH POULSEN
, who verbally acknowledged these results.

## 2023-04-25 IMAGING — CT CT HEAD W/O CM
3 of 4 series · 13 of 47 positions shown, 15 images · non-contrast
Comparison: Prior head CT from 11/10/2020.

CLINICAL DATA: Initial evaluation for acute head trauma, fall.

EXAM:
CT HEAD WITHOUT CONTRAST
CT CERVICAL SPINE WITHOUT CONTRAST
TECHNIQUE: Multidetector CT imaging of the head and cervical spine was
performed following the standard protocol without intravenous
contrast. Multiplanar CT image reconstructions of the cervical spine
were also generated.

[Series 2: head wo · axial · 0.41mm/px · z∈[-400,-285]mm · 7 of 31 slices shown, 9 images]
[im 4/31  brain]
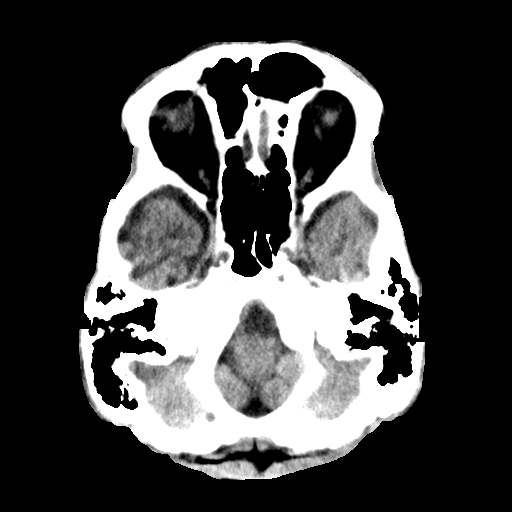
[im 4/31  bone]
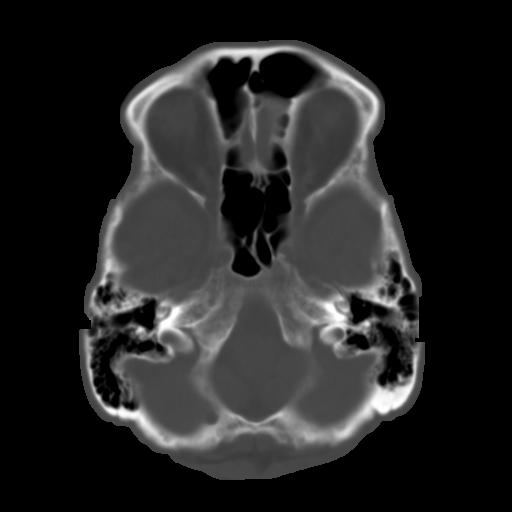
[im 8/31  brain]
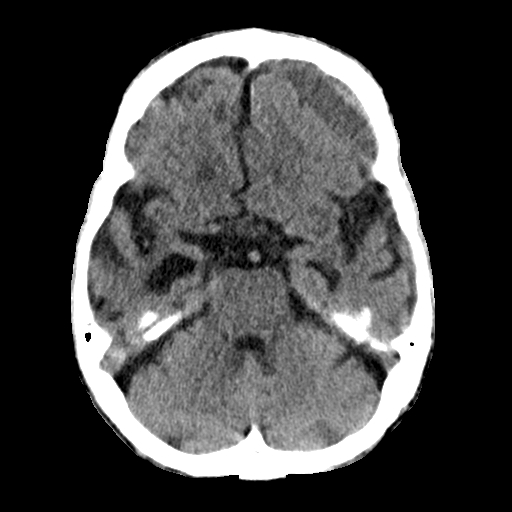
[im 12/31  brain]
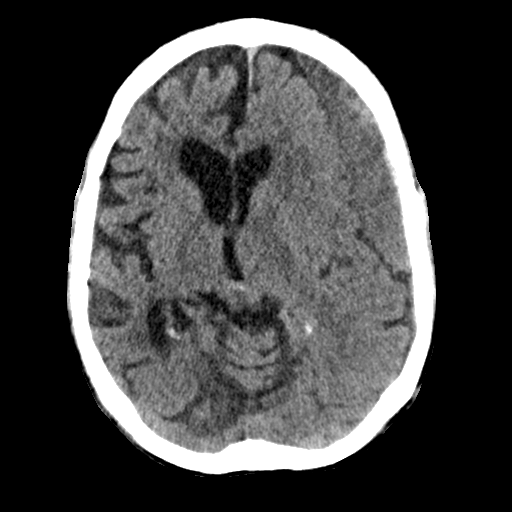
[im 16/31  brain]
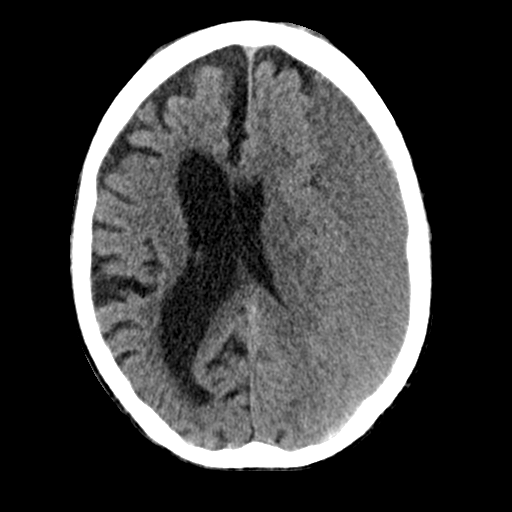
[im 19/31  brain]
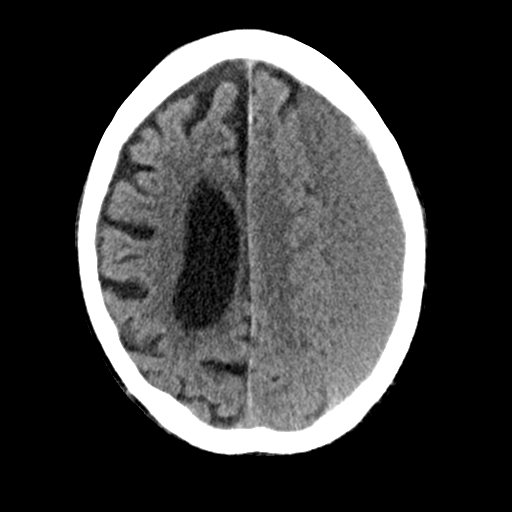
[im 19/31  bone]
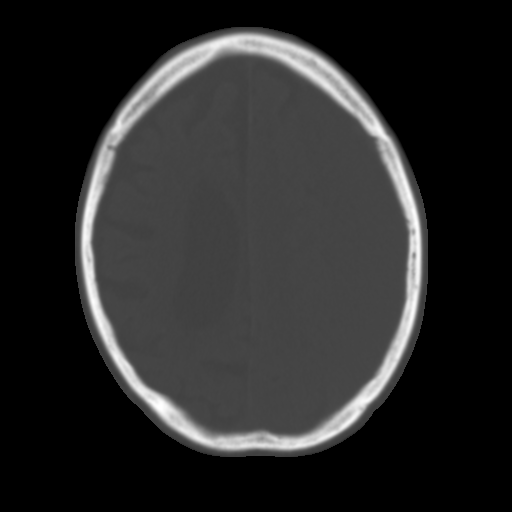
[im 23/31  brain]
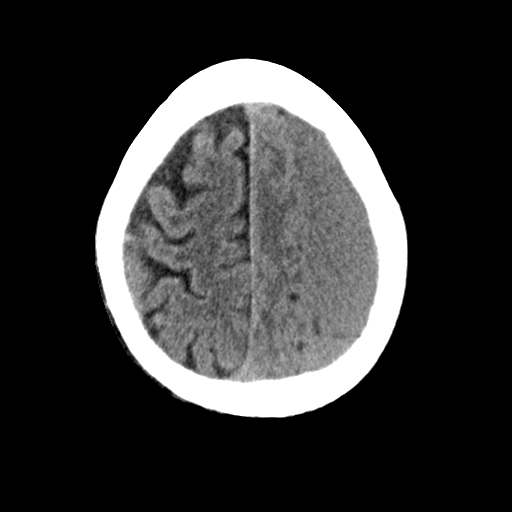
[im 27/31  brain]
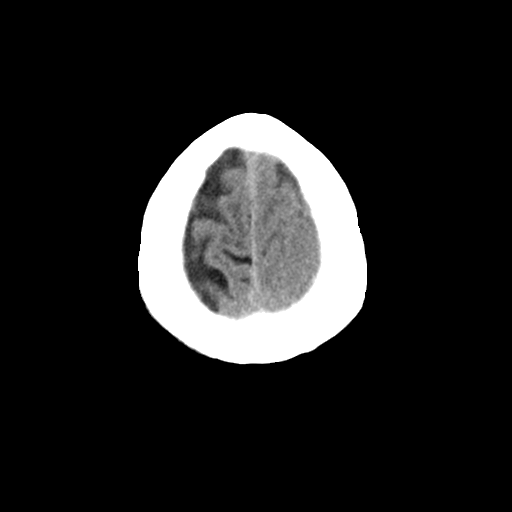

[Series 4: coronal soft · coronal · 0.36mm/px · 3 of 69 slices shown]
[im 23/69  brain]
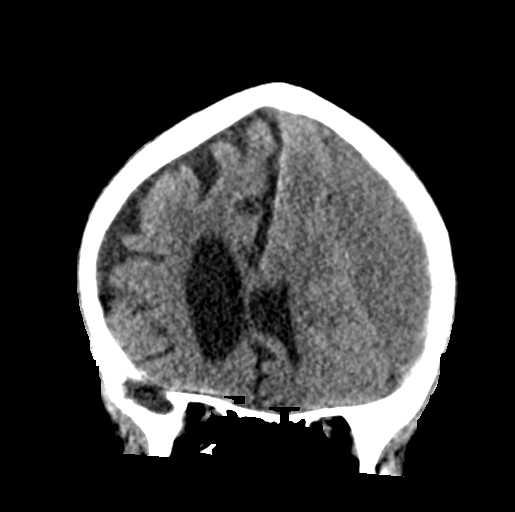
[im 31/69  brain]
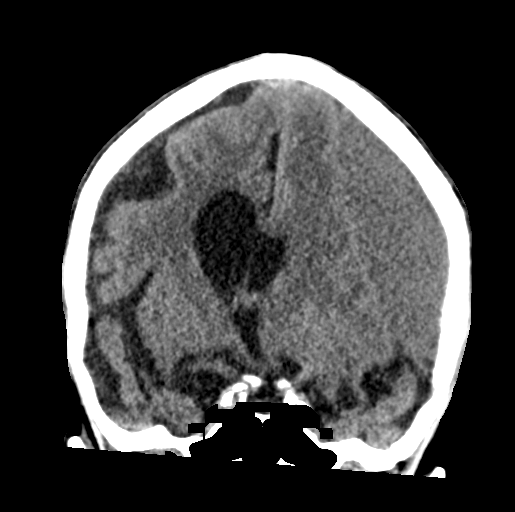
[im 38/69  brain]
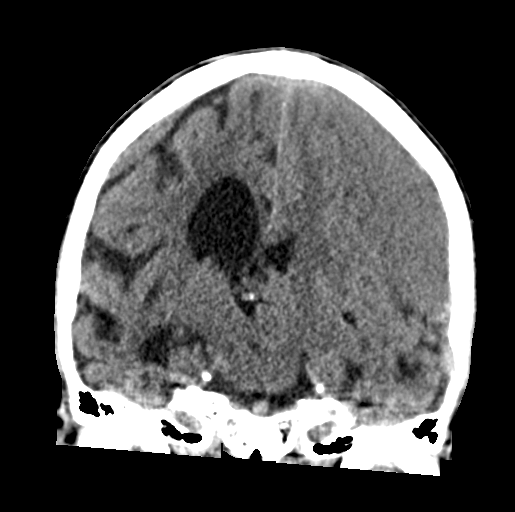

[Series 5: sagittal soft · sagittal · 0.32mm/px · 3 of 60 slices shown]
[im 20/60  brain]
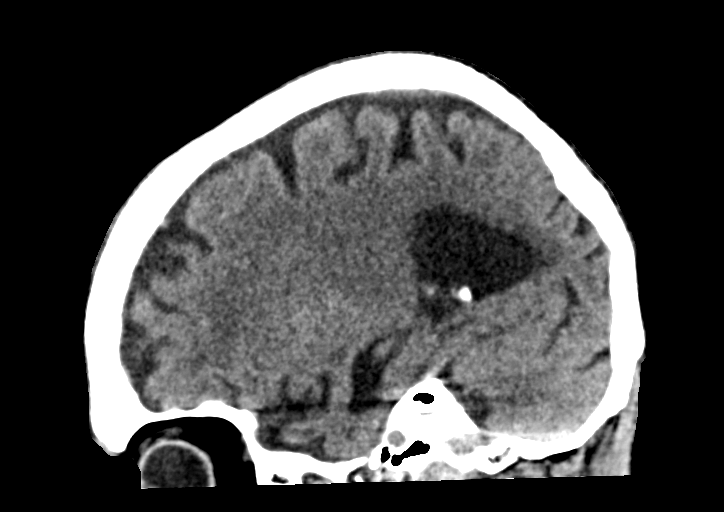
[im 30/60  brain]
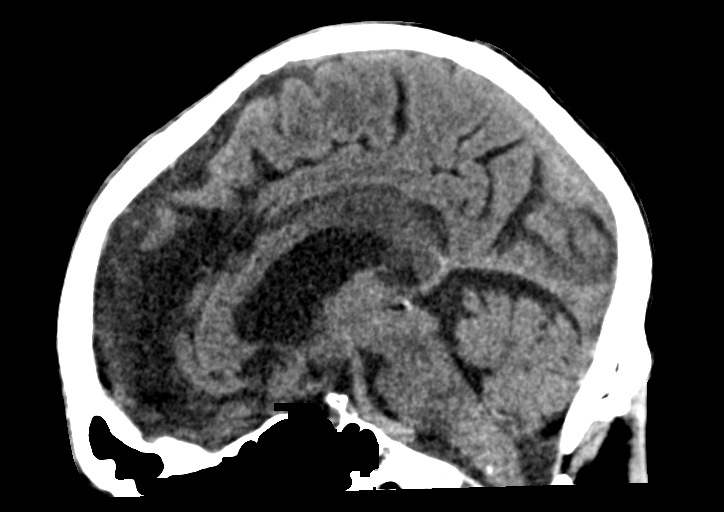
[im 40/60  brain]
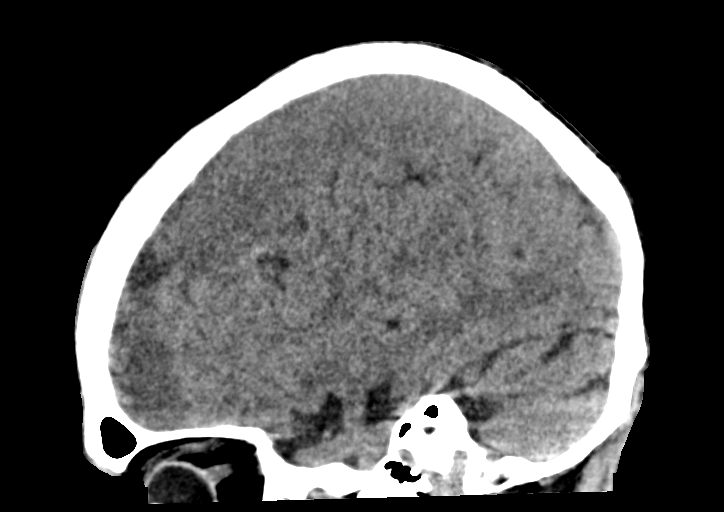

[13 of 47 positions shown; findings below may reference images not displayed]

FINDINGS: CT HEAD FINDINGS

Brain: There has been interval development of a fairly large
subdural hematoma on the left, largely isodense to adjacent brain
and subacute in appearance, although a small amount of more
hyperdense blood products is seen within this collection. Collection
measures up to approximately 3 cm in diameter. Associated mass
effect on the subjacent left cerebral hemisphere with up to 1 cm of
left-to-right shift. Additionally, there is a smaller largely
subacute appearing extra-axial collection overlying the right
cerebral convexity measuring up to 7 mm in thickness (series 4,
image 32), likely subdural as well. No significant mass effect.
There is partial effacement of the left lateral ventricle with
asymmetric dilatation of the right lateral ventricle, slightly
increased from previous exam, which could reflect a degree of mild
trapping.

Underlying atrophy with chronic small vessel ischemic disease. No
acute large vessel territory infarct. No mass lesion.

Vascular: No hyperdense vessel. Calcified atherosclerosis present at
skull base.

Skull: Small soft tissue contusions about the forehead, left greater
than right. Calvarium intact.

Sinuses/Orbits: Globes and orbital soft tissues demonstrate no acute
finding. Paranasal sinuses are clear. No mastoid effusion.

Other: None.

CT CERVICAL SPINE FINDINGS

Alignment: Straightening of the normal cervical lordosis. Trace
anterolisthesis of C4 on C5, C5 on C6, and C6 on C7, likely chronic
and degenerative.

Skull base and vertebrae: Skull base intact. Normal C1-2
articulations are preserved. Dens is intact. Vertebral body height
maintained. No acute fracture.

Soft tissues and spinal canal: Paraspinous soft tissues demonstrate
no acute finding. No abnormal prevertebral edema. Spinal canal
demonstrates no acute finding. Vascular calcifications about the
carotid bifurcations.

Disc levels: Mild-to-moderate multilevel cervical spondylosis with
mild spinal stenosis at C5-6.

Upper chest: Visualized upper chest demonstrates no acute finding.
Partially visualized lung apices are grossly clear.

Other: None.
IMPRESSION: CT BRAIN:

1. Interval development of a fairly large left subdural hematoma
measuring up to 3 cm, largely subacute in nature. Mass effect on the
subjacent left cerebral hemisphere with up to 1 cm left-to-right
shift. Mild dilatation of the right lateral ventricle as compared to
previous exam, suggesting mild and/or early trapping.
2. Smaller largely subacute appearing right extra-axial collection
measuring up to 7 mm in thickness, also likely subdural. No
associated mass effect.
3. Small soft tissue contusions about the forehead, left greater
than right. No calvarial fracture.
4. Underlying atrophy with chronic small vessel ischemic disease.

CT CERVICAL SPINE:

1. No acute traumatic injury within the cervical spine.
2. Mild-to-moderate multilevel cervical spondylosis with mild spinal
stenosis at C5-6.

Critical Value/emergent results were called by telephone at the time
of interpretation on 12/17/2020 at [DATE] to provider TAHIRAH POULSEN
, who verbally acknowledged these results.
